# Patient Record
Sex: Female | Born: 1970 | Race: White | Hispanic: Yes | Marital: Single | Smoking: Former smoker
Health system: Southern US, Community
[De-identification: ages and names within clinical notes are randomized; demographics above are authoritative.]

## PROBLEM LIST (undated history)

## (undated) DIAGNOSIS — R51 Headache: Secondary | ICD-10-CM

## (undated) DIAGNOSIS — I959 Hypotension, unspecified: Secondary | ICD-10-CM

## (undated) DIAGNOSIS — F32A Depression, unspecified: Secondary | ICD-10-CM

## (undated) DIAGNOSIS — R519 Headache, unspecified: Secondary | ICD-10-CM

## (undated) DIAGNOSIS — M545 Low back pain, unspecified: Secondary | ICD-10-CM

## (undated) DIAGNOSIS — K5900 Constipation, unspecified: Secondary | ICD-10-CM

## (undated) DIAGNOSIS — D649 Anemia, unspecified: Secondary | ICD-10-CM

## (undated) DIAGNOSIS — F329 Major depressive disorder, single episode, unspecified: Secondary | ICD-10-CM

## (undated) DIAGNOSIS — E079 Disorder of thyroid, unspecified: Secondary | ICD-10-CM

## (undated) DIAGNOSIS — I499 Cardiac arrhythmia, unspecified: Secondary | ICD-10-CM

## (undated) DIAGNOSIS — I8390 Asymptomatic varicose veins of unspecified lower extremity: Secondary | ICD-10-CM

## (undated) DIAGNOSIS — K219 Gastro-esophageal reflux disease without esophagitis: Secondary | ICD-10-CM

## (undated) HISTORY — DX: Disorder of thyroid, unspecified: E07.9

## (undated) HISTORY — PX: TUBAL LIGATION: SHX77

---

## 2012-12-24 ENCOUNTER — Emergency Department (INDEPENDENT_AMBULATORY_CARE_PROVIDER_SITE_OTHER)
Admission: EM | Admit: 2012-12-24 | Discharge: 2012-12-24 | Disposition: A | Discharge status: Home / Self Care | Payer: BC Managed Care – PPO | Source: Home / Self Care | Attending: Family Medicine | Admitting: Family Medicine

## 2012-12-24 ENCOUNTER — Encounter (HOSPITAL_COMMUNITY): Payer: Self-pay | Admitting: *Deleted

## 2012-12-24 DIAGNOSIS — H6123 Impacted cerumen, bilateral: Secondary | ICD-10-CM

## 2012-12-24 DIAGNOSIS — H6120 Impacted cerumen, unspecified ear: Secondary | ICD-10-CM

## 2012-12-24 MED ORDER — DOCUSATE SODIUM 50 MG/5ML PO LIQD
ORAL | Status: AC
  Filled 2012-12-24: qty 10

## 2012-12-24 NOTE — ED Provider Notes (Signed)
Medical screening examination/treatment/procedure(s) were performed by resident physician or non-physician practitioner and as supervising physician I was immediately available for consultation/collaboration.   KINDL,JAMES DOUGLAS MD.   James D Kindl, MD 12/24/12 1911 

## 2012-12-24 NOTE — ED Provider Notes (Signed)
History     CSN: [id number]  Arrival date & time 12/24/12  1617   First MD Initiated Contact with Patient 12/24/12 1709      Chief Complaint  Patient presents with  . Otalgia    (Consider location/radiation/quality/duration/timing/severity/associated sxs/prior treatment) Patient is a 42 y.o. female presenting with ear pain. The history is provided by the patient. No language interpreter was used.  Otalgia Location:  Bilateral Severity:  Mild Onset quality:  Gradual Timing:  Constant Pt complains of wax build up in both ears  History reviewed. No pertinent past medical history.  History reviewed. No pertinent past surgical history.  History reviewed. No pertinent family history.  History  Substance Use Topics  . Smoking status: Never Smoker   . Smokeless tobacco: Not on file  . Alcohol Use: No    OB History   Grav Para Term Preterm Abortions TAB SAB Ect Mult Living                  Review of Systems  HENT: Positive for ear pain.   All other systems reviewed and are negative.    Allergies  Review of patient's allergies indicates no known allergies.  Home Medications  No current outpatient prescriptions on file.  BP 112/80  Pulse 72  Temp(Src) 98.6 F (37 C) (Oral)  Resp 16  SpO2 100%  LMP 12/21/2012  Physical Exam  Constitutional: She appears well-developed and well-nourished.  HENT:  Head: Normocephalic and atraumatic.  Mouth/Throat: Oropharynx is clear and moist.  bilat tm's occluded with wax  Eyes: Pupils are equal, round, and reactive to light.  Neurological: She is alert.    ED Course  Procedures (including critical care time)  Labs Reviewed - No data to display No results found.   1. Cerumen impaction, bilateral       MDM  RN irrigatted tm's until clear        Elson Areas, PA-C 12/24/12 1844

## 2012-12-24 NOTE — ED Notes (Signed)
Pt  Reports  Symptoms  Of  l    Earache   X  4  Days       -  denys  Any  sorethroat         denys  Any  Fever              Sitting  Upright  On  Exam table      Speaking in  Complete  sentances

## 2013-10-25 ENCOUNTER — Ambulatory Visit: Payer: BC Managed Care – PPO | Attending: Internal Medicine

## 2014-12-25 ENCOUNTER — Ambulatory Visit: Payer: 59 | Attending: Internal Medicine | Admitting: Internal Medicine

## 2014-12-25 ENCOUNTER — Encounter: Payer: Self-pay | Admitting: Internal Medicine

## 2014-12-25 VITALS — BP 102/65 | HR 87 | Temp 98.5°F | Resp 16 | Ht 63.0 in | Wt 119.0 lb

## 2014-12-25 DIAGNOSIS — R1013 Epigastric pain: Secondary | ICD-10-CM

## 2014-12-25 DIAGNOSIS — M5430 Sciatica, unspecified side: Secondary | ICD-10-CM | POA: Insufficient documentation

## 2014-12-25 DIAGNOSIS — R12 Heartburn: Secondary | ICD-10-CM | POA: Insufficient documentation

## 2014-12-25 DIAGNOSIS — R52 Pain, unspecified: Secondary | ICD-10-CM

## 2014-12-25 DIAGNOSIS — M5431 Sciatica, right side: Secondary | ICD-10-CM

## 2014-12-25 DIAGNOSIS — K30 Functional dyspepsia: Secondary | ICD-10-CM

## 2014-12-25 LAB — CBC
HEMATOCRIT: 34.3 % — AB (ref 36.0–46.0)
HEMOGLOBIN: 10.7 g/dL — AB (ref 12.0–15.0)
MCH: 25.1 pg — AB (ref 26.0–34.0)
MCHC: 31.2 g/dL (ref 30.0–36.0)
MCV: 80.3 fL (ref 78.0–100.0)
MPV: 10.8 fL (ref 8.6–12.4)
Platelets: 252 10*3/uL (ref 150–400)
RBC: 4.27 MIL/uL (ref 3.87–5.11)
RDW: 14.6 % (ref 11.5–15.5)
WBC: 3.7 10*3/uL — ABNORMAL LOW (ref 4.0–10.5)

## 2014-12-25 LAB — COMPLETE METABOLIC PANEL WITH GFR
ALK PHOS: 62 U/L (ref 39–117)
ALT: 25 U/L (ref 0–35)
AST: 25 U/L (ref 0–37)
Albumin: 4.2 g/dL (ref 3.5–5.2)
BUN: 9 mg/dL (ref 6–23)
CALCIUM: 9.5 mg/dL (ref 8.4–10.5)
CHLORIDE: 102 meq/L (ref 96–112)
CO2: 25 mEq/L (ref 19–32)
CREATININE: 0.9 mg/dL (ref 0.50–1.10)
GFR, Est Non African American: 79 mL/min
GLUCOSE: 84 mg/dL (ref 70–99)
POTASSIUM: 4.7 meq/L (ref 3.5–5.3)
SODIUM: 137 meq/L (ref 135–145)
TOTAL PROTEIN: 7.4 g/dL (ref 6.0–8.3)
Total Bilirubin: 0.4 mg/dL (ref 0.2–1.2)

## 2014-12-25 LAB — TSH: TSH: 2.049 u[IU]/mL (ref 0.350–4.500)

## 2014-12-25 MED ORDER — RANITIDINE HCL 150 MG PO TABS: 150.0000 mg | ORAL_TABLET | Freq: Two times a day (BID) | ORAL | Status: DC

## 2014-12-25 MED ORDER — IBUPROFEN 600 MG PO TABS: 600.0000 mg | ORAL_TABLET | Freq: Two times a day (BID) | ORAL | Status: DC | PRN

## 2014-12-25 MED ORDER — CYCLOBENZAPRINE HCL 5 MG PO TABS: 5.0000 mg | ORAL_TABLET | Freq: Three times a day (TID) | ORAL | Status: DC | PRN

## 2014-12-25 NOTE — Progress Notes (Signed)
Pt is here today to establish care. Pt is c/o chronic widespread pain. Interpreter Francisco.

## 2014-12-25 NOTE — Progress Notes (Signed)
Patient ID: Desiree Krueger, female   DOB: 1971/01/21, 44 y.o.   MRN: [id number]   [id number]  [id number]  DOB - [birth year]  CC:  Chief Complaint  Patient presents with  . Establish Care       HPI: Desiree Krueger is a 44 y.o. female here today to establish medical care. She has no past medical history. She presents today with widespread pain that has been present for 6 months.  The pain is located in her head, left ear, stomach, and left lower back. Everyday the pain is located somewhere different.  It is described as a deep pain in her head and shoulders. It is moderate in severity. She has tried taking tylenol for pain, with moderate relief. Lower back radiates down her right leg and feels like a nerve pain. She reports that she has been overly stressed and slightly depressed about some personal issues..  No Known Allergies History reviewed. No pertinent past medical history. No current outpatient prescriptions on file prior to visit.   No current facility-administered medications on file prior to visit.   Family History  Problem Relation Age of Onset  . Cancer Maternal Uncle    History   Social History  . Marital Status: Single    Spouse Name: N/A  . Number of Children: N/A  . Years of Education: N/A   Occupational History  . Not on file.   Social History Main Topics  . Smoking status: Never Smoker   . Smokeless tobacco: Not on file  . Alcohol Use: No  . Drug Use: Not on file  . Sexual Activity: Not on file   Other Topics Concern  . Not on file   Social History Narrative    Review of Systems  Constitutional: Positive for malaise/fatigue. Negative for fever, chills and weight loss.  HENT: Positive for ear pain. Negative for ear discharge.   Gastrointestinal: Positive for heartburn and abdominal pain.  Musculoskeletal: Positive for myalgias and back pain.  Neurological: Positive for headaches.  Psychiatric/Behavioral: Positive for depression.    All other systems reviewed and are negative.  .    Objective:   Filed Vitals:   12/25/14 0909  BP: 102/65  Pulse: 87  Temp: 98.5 F (36.9 C)  Resp: 16    Physical Exam: Constitutional: Patient appears well-developed and well-nourished. No distress. HENT: Normocephalic, atraumatic, External right and left ear normal. Oropharynx is clear and moist.  Eyes: Conjunctivae and EOM are normal. PERRLA, no scleral icterus. Neck: Normal ROM. Neck supple. No JVD. No tracheal deviation. No thyromegaly. CVS: RRR, S1/S2 +, no murmurs, no gallops, no carotid bruit.  Pulmonary: Effort and breath sounds normal, no stridor, rhonchi, wheezes, rales.  Abdominal: Soft. BS +, no distension, tenderness, rebound or guarding.  Musculoskeletal: Normal range of motion. No edema and no tenderness. + straight leg raise--right  Lymphadenopathy: No lymphadenopathy noted, cervical Neuro: Alert. Normal reflexes, muscle tone coordination. No cranial nerve deficit. Skin: Skin is warm and dry. No rash noted. Not diaphoretic. No erythema. No pallor. Psychiatric: Normal mood and affect. Behavior, judgment, thought content normal.  No results found for: WBC, HGB, HCT, MCV, PLT No results found for: CREATININE, BUN, NA, K, CL, CO2  No results found for: HGBA1C Lipid Panel  No results found for: CHOL, TRIG, HDL, CHOLHDL, VLDL, LDLCALC     Assessment and plan:   Desiree Krueger was seen today for establish care.  Diagnoses and all orders for this visit:  Sciatica, right Orders: -  Begin ibuprofen (ADVIL,MOTRIN) 600 MG tablet; Take 1 tablet (600 mg total) by mouth 2 (two) times daily as needed. For pain -     Begin cyclobenzaprine (FLEXERIL) 5 MG tablet; Take 1 tablet (5 mg total) by mouth 3 (three) times daily as needed for muscle spasms. Back pain  Acid indigestion Orders: -     Begin ranitidine (ZANTAC) 150 MG tablet; Take 1 tablet (150 mg total) by mouth 2 (two) times daily. For stomach Discussed diet and  weight with patient relating to acid reflux.  Went over things that may exacerbate acid reflux such as tomatoes, spicy foods, coffee, carbonated beverages, chocolates, etc.  Advised patient to avoid laying down at least two hours after meals and sleep with HOB elevated.   Generalized pain Orders: -     TSH -     CBC -     COMPLETE METABOLIC PANEL WITH GFR -     Vitamin D, 25-hydroxy Will check blood levels to make sure it is not thyroid or vitamin D. If patients pain does not improve with ibuprofen and flexeril she will need additional depression screening.   Due to language barrier, an interpreter was present during the history-taking and subsequent discussion (and for part of the physical exam) with this patient.   Return if symptoms worsen or fail to improve.   Chari Manning, [facility] [phone] 12/25/2014, 9:18 AM

## 2014-12-25 NOTE — Patient Instructions (Signed)
Ciática  °(Sciatica) ° La ciática es el dolor, debilidad, entumecimiento u hormigueo a lo largo del nervio ciático. El nervio comienza en la zona inferior de la espalda y desciende por la parte posterior de cada pierna. El nervio controla los músculos de la parte inferior de la pierna y de la zona posterior de la rodilla, y transmite la sensibilidad a la parte posterior del muslo, la pierna y la planta del pie. La ciática es un síntoma de otras afecciones médicas. Por ejemplo, un daño a los nervios o algunas enfermedades como un disco herniado o un espolón óseo en la columna vertebral, podrían dañarle o presionar en el nervio ciático. Esto causa dolor, debilidad y otras sensaciones normalmente asociadas con la ciática. Generalmente la ciática afecta sólo un lado del cuerpo. °CAUSAS  °· Disco herniado o desplazado. °· Enfermedad degenerativa del disco. °· Un síndrome doloroso que compromete un músculo angosto de los glúteos (síndrome piriforme). °· Lesión o fractura pélvica. °· Embarazo. °· Tumor (casos raros). °SÍNTOMAS  °Los síntomas pueden variar de leves a muy graves. Por lo general, los síntomas descienden desde la zona lumbar a las nalgas y la parte posterior de la pierna. Ellos son:  °· Hormigueo leve o dolor sordo en la parte inferior de la espalda, la pierna o la cadera. °· Adormecimiento en la parte posterior de la pantorrilla o la planta del pie. °· Sensación de quemazón en la zona lumbar, la pierna o la cadera. °· Dolor agudo en la zona inferior de la espalda, la pierna o la cadera. °· Debilidad en las piernas. °· Dolor de espalda intenso que inhibe los movimientos. °Los síntomas pueden empeorar al toser, estornudar, reír o estar sentado o parado durante mucho tiempo. Además, el sobrepeso puede empeorar los síntomas.  °DIAGNÓSTICO  °Su médico le hará un examen físico para buscar los síntomas comunes de la ciática. Le pedirá que haga algunos movimientos o actividades que activarían el dolor del nervio  ciático. Para encontrar las causas de la ciática podrá indicarle otros estudios. Estos pueden ser:  °· Análisis de sangre. °· Radiografías. °· Pruebas de diagnóstico por imágenes, como resonancia magnética o tomografía computada. °TRATAMIENTO  °El tratamiento se dirige a las causas de la ciática. A veces, el tratamiento no es necesario, y el dolor y el malestar desaparecen por sí mismos. Si necesita tratamiento, su médico puede sugerir:  °· Medicamentos de venta libre para aliviar el dolor. °· Medicamentos recetados, como antiinflamatorios, relajantes musculares o narcóticos. °· Aplicación de calor o hielo en la zona del dolor. °· Inyecciones de corticoides para disminuir el dolor, la irritación y la inflamación alrededor del nervio. °· Reducción de la actividad en los períodos de dolor. °· Ejercicios y estiramiento del abdomen para fortalecer y mejorar la flexibilidad de la columna vertebral. Su médico puede sugerirle perder peso si el peso extra empeora el dolor de espalda. °· Fisioterapia. °· La cirugía para eliminar lo que presiona o pincha el nervio, como un espolón óseo o parte de una hernia de disco. °INSTRUCCIONES PARA EL CUIDADO EN EL HOGAR  °· Sólo tome medicamentos de venta libre o recetados para calmar el dolor o el malestar, según las indicaciones de su médico. °· Aplique hielo sobre el área dolorida durante 20 minutos 3-4 veces por día durante los primeras 48-72 horas. Luego intente aplicar calor de la misma manera. °· Haga ejercicios, elongue o realice sus actividades habituales, si no le causan más dolor. °· Cumpla con todas las sesiones de fisioterapia, según le   indique su mdico.  Cumpla con todas las visitas de control, segn le indique su mdico.  No use tacones altos o zapatos que no tengan buen apoyo.  Verifique que el colchn no sea muy blando. Un colchn firme Best boy y las [place]. SOLICITE ATENCIN MDICA DE INMEDIATO SI:   Pierde el control de la vejiga o del intestino  (incontinencia).  Aumenta la debilidad en la zona inferior de la espalda, la pelvis, las nalgas o las piernas.  Siente irritacin o inflamacin en la espalda.  Tiene sensacin de ardor al [employer].  El dolor empeora cuando se acuesta o lo despierta por la noche.  El dolor es peor del que experiment en el pasado.  Dura ms de 4 semanas.  Pierde peso sin motivo de [place] sbita. ASEGRESE DE QUE:   Comprende estas instrucciones.  Controlar su enfermedad.  Solicitar ayuda de inmediato si no mejora o si empeora. Document Released: 08/01/2005 Document Revised: 01/31/2012 [facility] Patient Information 2015 [place], [place]. This information is not intended to replace advice given to you by your health care provider. Make sure you discuss any questions you have with your health care provider.

## 2014-12-26 LAB — VITAMIN D 25 HYDROXY (VIT D DEFICIENCY, FRACTURES): Vit D, 25-Hydroxy: 34 ng/mL (ref 30–100)

## 2015-01-19 ENCOUNTER — Telehealth: Payer: Self-pay | Admitting: Internal Medicine

## 2015-01-19 ENCOUNTER — Telehealth: Payer: Self-pay | Admitting: *Deleted

## 2015-01-19 NOTE — Telephone Encounter (Signed)
PI [phone] left message

## 2015-01-19 NOTE — Telephone Encounter (Signed)
-----   Message from Lance Bosch, NP sent at 01/13/2015 12:55 PM EDT ----- Patient is anemic. Find out if she has heavy menstrual cycles and let me know. This may be some of the reason why she feels fatigued. All other labs are normal. May help for patient to take a OTC iron supplement once every other day for the next month.

## 2015-01-19 NOTE — Telephone Encounter (Signed)
Patient is returning phone call from nurse, would like a call back after 1pm this afternoon. Please f/u with pt.

## 2015-01-22 ENCOUNTER — Telehealth: Payer: Self-pay | Admitting: Internal Medicine

## 2015-01-22 NOTE — Telephone Encounter (Signed)
Patient came into office requesting results, patient would like to be reached after 1pm everyday. Please f/u with patient

## 2016-02-22 ENCOUNTER — Ambulatory Visit (INDEPENDENT_AMBULATORY_CARE_PROVIDER_SITE_OTHER): Payer: BLUE CROSS/BLUE SHIELD | Admitting: Gynecology

## 2016-02-22 ENCOUNTER — Encounter: Payer: Self-pay | Admitting: Gynecology

## 2016-02-22 VITALS — BP 108/68 | Ht 62.0 in | Wt 118.0 lb

## 2016-02-22 DIAGNOSIS — N938 Other specified abnormal uterine and vaginal bleeding: Secondary | ICD-10-CM | POA: Insufficient documentation

## 2016-02-22 LAB — CBC WITH DIFFERENTIAL/PLATELET
BASOS PCT: 0 %
Basophils Absolute: 0 cells/uL (ref 0–200)
EOS PCT: 2 %
Eosinophils Absolute: 98 cells/uL (ref 15–500)
HEMATOCRIT: 33.3 % — AB (ref 35.0–45.0)
HEMOGLOBIN: 10.7 g/dL — AB (ref 11.7–15.5)
LYMPHS ABS: 2107 {cells}/uL (ref 850–3900)
Lymphocytes Relative: 43 %
MCH: 28.1 pg (ref 27.0–33.0)
MCHC: 32.1 g/dL (ref 32.0–36.0)
MCV: 87.4 fL (ref 80.0–100.0)
MONO ABS: 343 {cells}/uL (ref 200–950)
MPV: 10.6 fL (ref 7.5–12.5)
Monocytes Relative: 7 %
NEUTROS PCT: 48 %
Neutro Abs: 2352 cells/uL (ref 1500–7800)
Platelets: 244 10*3/uL (ref 140–400)
RBC: 3.81 MIL/uL (ref 3.80–5.10)
RDW: 13.9 % (ref 11.0–15.0)
WBC: 4.9 10*3/uL (ref 3.8–10.8)

## 2016-02-22 NOTE — Progress Notes (Signed)
   HPI: Patient is a 45 year old gravida 4 para 4 (prior laparoscopic tubal ligation) is a new patient to the practice. Patient stated that she had complete gynecological exam to include Pap smear and mammogram in November 2016 in her native country of [place]. She stated that about 10 years ago she had cryotherapy of her cervix and after that her patches of been normal. The reason for visit is that for the past 8 months she has complaining of irregular bleeding whereby some months she'll go bleeding for 2 months with passage of large clots and cramping. She denies any bleeding disorders.  ROS: A ROS was performed and pertinent positives and negatives are included in the history.  GENERAL: No fevers or chills. HEENT: No change in vision, no earache, sore throat or sinus congestion. NECK: No pain or stiffness. CARDIOVASCULAR: No chest pain or pressure. No palpitations. PULMONARY: No shortness of breath, cough or wheeze. GASTROINTESTINAL: No abdominal pain, nausea, vomiting or diarrhea, melena or bright red blood per rectum. GENITOURINARY: No urinary frequency, urgency, hesitancy or dysuria. MUSCULOSKELETAL: No joint or muscle pain, no back pain, no recent trauma. DERMATOLOGIC: No rash, no itching, no lesions. ENDOCRINE: No polyuria, polydipsia, no heat or cold intolerance. No recent change in weight. HEMATOLOGICAL: See above NEUROLOGIC: No headache, seizures, numbness, tingling or weakness. PSYCHIATRIC: No depression, no loss of interest in normal activity or change in sleep pattern.   PE: Blood pressure 108/68   weight 118 pounds   BMI 21.58 kg/m Gen. appearance well-developed C female with above-mentioned complaint Abdomen: Soft nontender no rebound or guarding Pelvic: Bartholin urethra Skene was within normal limits Vagina: No lesions or discharge no blood noted in the vaginal vault Cervix: No lesions or discharge Uterus: Anteverted normal size shape and consistency and nontender Adnexa:  No palpable mass or tenderness Rectal exam not done  Patient was counseled for an endometrial biopsy due to her irregular bleeding pattern as described above. The cervix was cleansed with Betadine solution. The uterus sounded to 8 cm. A sterile Pipelle was introduced into the uterine cavity and tissue was obtained and submitted for histological evaluation. Patient was given 2 tablets of Advil for some cramping after procedure otherwise she tolerated well.   Assessment Plan: Dysfunctional uterine bleeding for 8 months. Endometrial biopsy done today result pending at time of this dictation. Patient will be scheduled for sonohysterogram next week to completely assess the uterine cavity to rule out endometrial polyp or submucous myoma. If her biopsy is negative the son hysterogram is negative I've given her literature information on several alternative such as endometrial ablation, Mirena IUD and if all fails hysterectomy. Patient stated several years in the past before her tubal ligation she could not tolerate oral contraceptive pills because she got nauseated.    Greater than 50% of time was spent in counseling and coordinating care of this patient.   Time of consultation: 30   Minutes.

## 2016-02-22 NOTE — Patient Instructions (Addendum)
Levonorgestrel intrauterine device (IUD) Qu es este medicamento? El LEVONORGESTREL (DIU) es un dispositivo anticonceptivo (control de natalidad). El dispositivo se coloca dentro del tero por un profesional de la salud. Se utiliza para Therapist, occupational y tambin se puede Risk manager para tratar el sangrado abundante que ocurre durante su perodo. Dependiendo del dispositivo, se puede utilizar por 3 a 5 aos. Este medicamento puede ser utilizado para otros usos; si tiene alguna pregunta consulte con su proveedor de atencin mdica o con su farmacutico. Qu le debo informar a mi profesional de la salud antes de tomar este medicamento? Necesita saber si usted presenta alguno de los siguientes problemas o situaciones: -exmen de Papanicolaou anormal -cncer de mama, cuello del tero o tero -diabetes -endometritis -si tiene una infeccin plvica o genital actual o en el pasado -tiene ms de una pareja sexual o si su pareja tiene ms de una pareja -enfermedad cardiaca -antecedente de embarazo tubrico o ectpico -problemas del sistema inmunolgico -DIU colocado -enfermedad heptica o tumor del hgado -problemas con la coagulacin o si toma diluyentes sanguneos -[place] medicamentos intravenoso -forma inusual del tero -sangrado vaginal que no tiene explicacin -una reaccin alrgica o inusual al levonorgestrel, a otras hormonas, a la silicona o polietilenos, a otros medicamentos, alimentos, colorantes o conservantes -si est embarazada o buscando quedar embarazada -si est amamantando a un beb Cmo debo utilizar este medicamento? Un profesional de Estate agent este dispositivo en el tero. Hable con su pediatra para informarse acerca del uso de este medicamento en nios. Puede requerir atencin especial. Sobredosis: Pngase en contacto inmediatamente con un centro toxicolgico o una sala de urgencia si usted cree que haya tomado demasiado medicamento. ATENCIN: [employer] es solo  para usted. No comparta este medicamento con nadie. Qu sucede si me olvido de una dosis? No se aplica en este caso. Qu puede interactuar con este medicamento? No tome esta medicina con ninguno de los siguientes medicamentos: -amprenavir -bosentano -fosamprenavir Esta medicina tambin puede interactuar con los siguientes medicamentos: -aprepitant -barbitricos para producir el sueo o para el tratamiento de convulsiones -bexaroteno -griseofulvina -medicamentos para tratar los convulsiones, tales como [place], [place], [place], [place], [place], topiramato -modafinilo -pioglitazona -rifabutina -rifampicina -rifapentina -algunos medicamentos para tratar el virus VIH, tales como atazanavir, indinavir, lopinavir, nelfinavir, tipranavir, ritonavir -hierba de [place] -warfarina Puede ser que esta lista no menciona todas las posibles interacciones. Informe a su profesional de [employer]	[place] de [employer] productos a base de hierbas, medicamentos de [place] o suplementos nutritivos que est tomando. Si usted fuma, consume bebidas alcohlicas o si utiliza drogas ilegales, indqueselo tambin a su profesional de [employer]	[place]. Algunas sustancias pueden interactuar con su medicamento. A qu debo estar atento al usar [employer]? Visite a su mdico o a su profesional de la salud para chequear su evolucin peridicamente. Visite a su mdico si usted o su pareja tiene relaciones sexuales con [employer], se vuelve VIH positivo o contrae una enfermedad de transmisin sexual. Este medicamento no la protege de la infeccin por VIH (SIDA) ni de ninguna otra enfermedad de transmisin sexual. Puede controlar la ubicacin del DIU usted misma palpando con sus dedos limpios los hilos en la parte anterior de la vagina. No tire de los hilos. Es un buen hbito controlar la ubicacin del dispositivo despus de cada perodo menstrual. Si no slo siente los hilos sino que adems siente otra parte  ms del DIU o si no puede sentir los hilos, consulte a su mdico inmediatamente. El DIU puede salirse  por s solo. Puede quedar embarazada si el dispositivo se sale de Chief of Staff. Utilice un mtodo anticonceptivo adicional, como preservativos, y consulte a su proveedor de atencin mdica s observa que el DIU se sali de Chief of Staff. La utilizacin de tampones no cambia la posicin del DIU y no hay inconvenientes en usarlos durante su perodo. Qu efectos secundarios puedo tener al [employer] este medicamento? Efectos secundarios que debe informar a su mdico o a Barrister's clerk de la salud tan pronto como sea posible: -Chief of Staff como erupcin cutnea, picazn o urticarias, hinchazn de la cara, labios o lengua -fiebre, sntomas gripales -llagas genitales -alta presin sangunea -ausencia de un perodo menstrual durante 6 semanas mientras lo utiliza -Social research officer, government, Occupational hygienist en las piernas -dolor o sensibilidad del plvico -dolor de cabeza repentino o severo -signos de Media planner -calambres estomacales -falta de aliento repentina -problemas de coordinacin, del habla, al caminar -sangrado, flujo vaginal inusual -color amarillento de los ojos o la piel Efectos secundarios que, por lo general, no requieren atencin mdica (debe informarlos a su mdico o a su profesional de la salud si persisten o si son molestos): -acn -dolor de pecho -cambios en el deseo sexual o capacidad -cambios de peso -calambres, Tree surgeon o sensacin de [employer] se introduce el dispositivo -dolor de cabeza -sangrado menstruales irregulares en los primeros 3 a 6 meses de usar -nuseas Puede ser que esta lista no menciona todos los posibles efectos secundarios. Comunquese a su mdico por asesoramiento mdico [employer] Usted puede informar los efectos secundarios a la FDA por telfono al 1-800-FDA-1088. Dnde debo guardar mi medicina? No se aplica en este caso. ATENCIN: Este folleto es  un resumen. Puede ser que no cubra toda la posible informacin. Si usted tiene preguntas acerca de esta medicina, consulte con su mdico, su farmacutico o su profesional de Technical sales engineer.    2016, Elsevier/Gold Standard. (2014-09-23 00:00:00) Biopsia de endometrio - Cuidados posteriores (Endometrial Biopsy, Care After) Siga estas instrucciones durante las prximas semanas. Estas indicaciones le proporcionan informacin general acerca de cmo deber cuidarse despus del procedimiento. El mdico tambin podr darle instrucciones ms especficas. El tratamiento se ha planificado de acuerdo a las prcticas mdicas actuales, pero a veces se producen problemas. Comunquese con el mdico si tiene algn problema o tiene dudas despus del procedimiento. QU ESPERAR DESPUS DEL PROCEDIMIENTO Despus del procedimiento, es tpico tener las siguientes sensaciones:  Sentir clicos leves y tendr una pequea cantidad de sangrado vaginal durante algunos das despus del procedimiento. Esto es normal. INSTRUCCIONES PARA EL CUIDADO EN EL HOGAR  Tome slo medicamentos de venta libre o recetados, segn las indicaciones del mdico.  No utilice tampones, duchas vaginales ni tenga relaciones sexuales hasta que el profesional la autorice.  Siga las indicaciones del mdico relacionadas con la restriccin a ciertas actividades, como ejercicios fsicos intensos o levantar objetos pesados. SOLICITE ATENCIN MDICA SI:  Tiene un sangrado abundante o sangra durante ms de 2 das despus del procedimiento.  Advierte un olor ftido que proviene de la vagina.  Siente escalofros o tiene fiebre.  Siente un dolor en el bajo vientre (abdominal) muy intenso. SOLICITE ATENCIN MDICA DE INMEDIATO SI:  Siente clicos intensos en el estmago o en la espalda.  Elimina cogulos grandes.  La hemorragia aumenta.  Se siente mareada, dbil, o se desmaya.   Esta informacin no tiene Marine scientist el consejo del mdico.  Asegrese de hacerle al mdico cualquier pregunta que tenga.   Document Released: 05/22/2013 Elsevier Interactive  Patient Education 2016 [place] endometrio (Endometrial Ablation) En la ablacin del endometrio se extirpa el recubrimiento interno del tero (endometrio). Este es generalmente un tratamiento que se Scientist, water quality, en forma ambulatoria. La ablacin permite evitar una ciruga mayor, como la ciruga para extirpar el cuello del tero y Nurse, learning disability (histerectoma). Luego de la ablacin del endometrio, tendr poco o nada de flujo menstrual y no podr tener hijos. Sin embargo, si se encuentra en la etapa de la premenopausia, deber usar un mtodo confiable de control de la natalidad luego del procedimiento, ya que hay una pequea probabilidad de que ocurra un Media planner. Hay diferentes razones para llevar a cabo este procedimiento, ellas son:  Perodos abundantes.  Sangrado que causa anemia.  Sangrado irregular.  Fibromas que sangran en la superficie interior del tero, si no miden ms de 3 cm. Tal vez no sea posible realizarle este procedimiento si:  Desea tener hijos en el futuro.  Tiene clicos intensos durante el perodo menstrual.  Tiene clulas precancerosas o cancerosas en el tero.  Tuvo un embarazo reciente.  Est cursando la menopausia.  Se someti a [place] mayor de tero, la cual le produjo el afinamiento de la pared [place]. Las cirugas pueden incluir lo siguiente:  La extirpacin de uno o ms fibromas uterinos (miomectoma).  Ardelia Mems cesrea con una incisin clsica (vertical) en el tero. Pregntele al mdico qu tipo de Pensions consultant. En ocasiones, la Arts development officer piel es diferente de la que se forma en el tero. Aunque se haya sometido a una ciruga uterina, algunos tipos de ablacin an pueden ser seguros para su caso. Hable con el mdico. INFORME A SU MDICO:  Cualquier alergia que tenga.  Todos los [employer] [place],  incluyendo vitaminas, hierbas, gotas oftlmicas, cremas y medicamentos de venta libre.  Problemas previos que usted o los [employer] de su familia hayan tenido con el uso de anestsicos.  Enfermedades de [employer].  Cirugas previas.  Padecimientos mdicos. RIESGOS Y COMPLICACIONES  Generalmente, este es un procedimiento seguro. Sin embargo, Games developer procedimiento, pueden surgir complicaciones. Las complicaciones posibles son:  Perforacin del tero.  Hemorragias.  Infeccin en el tero, vejiga, o vagina.  Lesin en los rganos circundantes.  Ardelia Mems burbuja de aire en un pulmn (embolia de aire).  Embarazo luego del procedimiento.  Si el procedimiento no resuelve el problema, ser necesaria una histerectoma.  Disminucin de la capacidad para Community education officer en el recubrimiento interno del tero. ANTES DEL PROCEDIMIENTO  Deber controlarse la membrana que recubre el tero para asegurarse de que no hay clulas cancerosas o precancerosas.  Le harn una ecografa para observar el tamao del tero y verificar si hay anormalidades.  Le darn medicamentos para Engineer, structural de la membrana que recubre el tero. PROCEDIMIENTO  Durante el procedimiento, el medico usar un instrumento llamado resectoscopio para ver el interior del tero. Hay diferentes formas de extirpar la membrana que cubre el tero.   Radiofrecuencia: en este mtodo se utiliza un aparato de radiofrecuencia, corriente elctrica alterna, para extirpar la membrana interna del tero.  Crioterapia: en este mtodo se [place] fro extremo para congelar la membrana del tero.  Lquido caliente: en este mtodo se utiliza solucin salina con el mismo objetivo.  Microondas: Se utilizan microondas de alta energa para calentar la membrana y extirparla.  Baln termal: consiste en insertar un catter con un baln en la punta dentro del tero. La punta del baln contiene un lquido caliente  que elimina la  membrana que tapiza el tero. DESPUS DEL PROCEDIMIENTO  Despus del procedimiento, no tenga relaciones sexuales ni inserte nada en la vagina hasta que su mdico la autorice. Despus del procedimiento podr experimentar:  Clicos.  Flujo vaginal.  Ganas de orinar con frecuencia.   Esta informacin no tiene Marine scientist el consejo del mdico. Asegrese de hacerle al mdico cualquier pregunta que tenga.   Document Released: 04/03/2013 Document Revised: 04/22/2015 Elsevier Interactive Patient Education [employer].

## 2016-03-02 ENCOUNTER — Other Ambulatory Visit: Payer: Self-pay | Admitting: Gynecology

## 2016-03-02 DIAGNOSIS — N939 Abnormal uterine and vaginal bleeding, unspecified: Secondary | ICD-10-CM

## 2016-03-08 ENCOUNTER — Telehealth: Payer: Self-pay | Admitting: Gynecology

## 2016-03-08 NOTE — Telephone Encounter (Signed)
03/08/16-I attempted to call the patient on cell per DPR but phone is not set up to accept incoming calls. Her BC ins will cover the sonohysterogram and bx if needed with her $20 copay. Per Justine@BC -Ref#[id number]

## 2016-03-16 ENCOUNTER — Ambulatory Visit (INDEPENDENT_AMBULATORY_CARE_PROVIDER_SITE_OTHER): Payer: BLUE CROSS/BLUE SHIELD

## 2016-03-16 ENCOUNTER — Other Ambulatory Visit: Payer: Self-pay | Admitting: Gynecology

## 2016-03-16 ENCOUNTER — Ambulatory Visit (INDEPENDENT_AMBULATORY_CARE_PROVIDER_SITE_OTHER): Payer: BLUE CROSS/BLUE SHIELD | Admitting: Gynecology

## 2016-03-16 ENCOUNTER — Encounter: Payer: Self-pay | Admitting: Gynecology

## 2016-03-16 DIAGNOSIS — N938 Other specified abnormal uterine and vaginal bleeding: Secondary | ICD-10-CM

## 2016-03-16 DIAGNOSIS — D250 Submucous leiomyoma of uterus: Secondary | ICD-10-CM | POA: Diagnosis not present

## 2016-03-16 DIAGNOSIS — D500 Iron deficiency anemia secondary to blood loss (chronic): Secondary | ICD-10-CM | POA: Diagnosis not present

## 2016-03-16 DIAGNOSIS — N939 Abnormal uterine and vaginal bleeding, unspecified: Secondary | ICD-10-CM | POA: Diagnosis not present

## 2016-03-16 DIAGNOSIS — N920 Excessive and frequent menstruation with regular cycle: Secondary | ICD-10-CM

## 2016-03-16 NOTE — Patient Instructions (Signed)
Informacin sobre Training and development officer (Hysterectomy Information)  La histerectoma es una ciruga que se realiza para extirpar el tero. Esta ciruga se puede realizar para tratar varios problemas mdicos. Despus de la ciruga, no volver a Personal assistant. Adems, debido a esta ciruga no podr quedar embarazada (estril). Asimismo, durante esta ciruga se podrn extirpar las trompas de falopio y los ovarios (salpingooforectoma bilateral).  RAZONES PARA REALIZAR UNA HISTERECTOMA  Sangrado persistente, anormal.  Dolor o infeccin persistente (crnico) en la pelvis.  El endometrio comienza a desarrollarse fuera del tero (endometriosis).  El endometrio comienza a desarrollarse en el msculo del tero (adenomiosis).  El tero desciende hacia la vagina (prolapso de los rganos de la pelvis).  Neoplasias benignas en el tero (fibromas uterinos) que provocan sntomas.  Clulas precancerosas.  Cncer cervical o cncer uterino. TIPOS DE HISTERECTOMA  Histerectoma supracervical: en este tipo de intervencin, se extirpa la parte superior del tero, pero no el cuello del tero.  Histerectoma total: se extirpan el tero y el cuello del tero.  Histerectoma radical: se extirpan el tero, el cuello del tero y los tejidos fibrosos que sostienen al tero en su lugar en la pelvis (parametrio). FORMAS EN QUE SE PUEDE REALIZAR UNA HISTERECTOMA  Histerectoma abdominal: se realiza un corte quirrgico grande (incisin) en el abdomen. Se extirpa el tero a travs de esta incisin.  Histerectoma vaginal: se realiza una incisin en la vagina. Se extirpa el tero a travs de esta incisin. No hay incisiones abdominales.  Histerectoma laparoscpica convencional: se realizan tres o cuatro incisiones pequeas en el abdomen. Se inserta un tubo delgado y luminoso con una cmara (laparoscopio) en una de las incisiones. A travs del resto de las incisiones se insertan otros instrumentos  quirrgicos. El tero se corta en trozos pequeos. Las piezas pequeas se eliminan a travs de las incisiones, o se retiran a travs de la vagina.  Histerectoma vaginal asistida por laparoscopia (HVAL): se realizan tres o cuatro incisiones pequeas en el abdomen. Parte de la ciruga se realiza por va laparoscpica y parte por va vaginal. El tero se extirpa a travs de la vagina.  Histerectoma laparoscpica asistida por robot: se insertan un laparoscopio y otros instrumentos quirrgicos en 3 o 4 incisiones pequeas en el abdomen. Se utiliza un dispositivo controlado por computadora para que el cirujano vea una imagen en 3D que lo ayuda a Chief Technology Officer los instrumentos quirrgicos. Esto permite movimientos ms precisos de los instrumentos quirrgicos. El tero se corta en trozos pequeos y se retira a travs de las incisiones o se elimina a travs de la vagina. RIESGOS Y [place] complicaciones asociadas con este procedimiento incluyen las siguientes:  Hemorragia y riesgos de Designer, industrial/product una transfusin sangunea. Infrmele al mdico si no quiere recibir ningn tipo de hemoderivados.  Cogulos de [employer] piernas o los pulmones.  Infeccin.  Lesin en los rganos circundantes.  Problemas o efectos secundarios relacionados con la anestesia.  Transformacin de cualquiera de las otras tcnicas en una histerectoma abdominal. QU ESPERAR DESPUES DE UNA HISTERECTOMA  Le darn medicamentos para el dolor.  Necesitar que alguien Nature conservation officer con usted durante los primeros 3 a 5 das despus de que regrese a Medical illustrator.  Tendr que ver al cirujano para un seguimiento 2 a 4 semanas despus de la ciruga para evaluar su progreso.  Posiblemente tenga sntomas tempranos de menopausia, como sofocos, sudoracin nocturna e insomnio.  Si le han realizado una histerectoma debido a un problema que no era cncer ni Ardelia Mems  afeccin que poda causar cncer, ya no necesitar una prueba de  Papanicolaou. Sin embargo, si ya no necesita hacerse un Papanicolau, es una buena idea hacerse un examen regularmente para asegurarse de que no hay otros problemas.   Esta informacin no tiene Marine scientist el consejo del mdico. Asegrese de hacerle al mdico cualquier pregunta que tenga.   Document Released: 08/01/2005 Document Revised: 05/22/2013 Elsevier Interactive Patient Education 2016 [employer]. Leuprolide depot injection Qu es [place] medicamento? El LEUPROLIDE es una protena artificial similar a la hormona natural en el cuerpo. Disminuye los niveles de [employer] hombres y de estrgeno en las mujeres. Para los hombres, este medicamento se [place] para tratar el cncer de prstata avanzada. Para las mujeres, algunas formas de este medicamento se utilizan parar tratar la endometriosis, fibromas uterinos u otros problemas relacionados a la hormona femenina. Este medicamento puede ser utilizado para otros usos; si tiene alguna pregunta consulte con su proveedor de atencin mdica o con su farmacutico. Qu le debo informar a mi profesional de la salud antes de tomar este medicamento? Necesita saber si usted presenta alguno de los [employer] o situaciones: -diabetes -enfermedad cardiaca o ataque cardiaco previo -alta presin sangunea -colesterol alto -osteoporosis -dolor o dificultad para orinar -metstasis en la mdula espinal -derrame cerebral -fuma tabaco -sangrado vaginal inusual (mujeres) -una reaccin alrgica o inusual al leuprolide, al alcohol benclico, a otros medicamentos, comidas, colorantes o conservantes -si est embarazada o buscando quedar embarazada -si est amamantando a un beb Cmo debo utilizar este medicamento? Este medicamento se administra mediante inyeccin por va intramuscular o inyeccin debajo de su piel. Lo administra un profesional de Technical sales engineer en un hospital o en un entorno clnico. El producto especifico determinar como  el medicamento ser administrado. Asegrese de entender cual producto a recibido y con que frecuencia lo va a recibir. Hable con su pediatra para informarse acerca del uso de este medicamento en nios. Puede requerir atencin especial. Sobredosis: Pngase en contacto inmediatamente con un centro toxicolgico o una sala de urgencia si usted cree que haya tomado demasiado medicamento. ATENCIN: [employer] es solo para usted. No comparta este medicamento con nadie. Qu sucede si me olvido de una dosis? Es importante no olvidar ninguna dosis. Informe a su mdico o a su profesional de la salud si no puede asistir a Photographer. Las Boeing de depot: Las inyecciones de depot se administran una vez al mes, cada 12 semanas, cada 16 semanas o cada 24 semanas depende del producto recetado. El producto que le recetan se basa en la enfermedad que tiene y si es un hombre o [employer]. Asegrese de entender su producto y su dosis. Qu puede interactuar con este medicamento? No tome esta medicina con ninguno de los siguientes medicamentos: -vitex Esta medicina tambin puede interactuar con los siguientes medicamentos: -suplementos a base de hierbas o dietticos como cimicifuga racemosa o DHEA -hormonas femeninas, como estrgenos, progestinas o pldoras, parches, anillos o inyecciones anticonceptivas -hormonas masculinas como la testosterona Puede ser que esta lista no menciona todas las posibles interacciones. Informe a su profesional de KB Home	[place] de [employer] Corporation productos a base de hierbas, medicamentos de [place] o suplementos nutritivos que est tomando. Si usted fuma, consume bebidas alcohlicas o si utiliza drogas ilegales, indqueselo tambin a su profesional de KB Home	[place]. Algunas sustancias pueden interactuar con su medicamento. A qu debo estar atento al usar [employer]? Visite a su mdico o a Barrister's clerk de la salud para Scientist, water quality evolucin  peridicamente. Durante las primeras semanas del  tratamiento sus sntomas pueden Copy, pero mejorarn a medida que avance con el Zeb. Puede experimentar sofocos, ms dolor en los huesos, mayor dificultad para Garment/textile technologist o un empeoramiento de los sntomas de los nervios. Consulte a su mdico o a Barrister's clerk de la salud sobre estos efectos; algunos de ellos pueden mejorar con el uso continuo de [place]. Las [employer] Corporation pueden experimentar un ciclo menstrual o [employer] primeros meses del tratamiento con este medicamento. Si sigue experimentado Apple Computer, consulte a su mdico o su profesional de Technical sales engineer. Qu efectos secundarios puedo tener al [employer] Corporation este medicamento? Efectos secundarios que debe informar a su mdico o a Barrister's clerk de la salud tan pronto como sea posible: -Chief of Staff como erupcin cutnea, picazn o urticarias, hinchazn de la cara, labios o lengua -problemas respiratorios -dolor en el pecho -depresin o trastornos de Engineering geologist en las piernas o entrepierna -Management consultant de la inyeccin o implante -dolor de cabeza severo -hinchazn de pies y piernas -cambios en la vista -vmito Efectos secundarios que, por lo general, no requieren atencin mdica (debe informarlos a su mdico o a su profesional de la salud si persisten o si son molestos): -hinchazn o sensibilidad de las mamas -disminucin de la capacidad o del deseo sexual -diarrea -sofocos -prdida del apetito -dolores musculares y Buyer, retail o de los huesos -nuseas -enrojecimiento o Actor de la inyeccin o implante -problemas en la piel o acn Puede ser que esta lista no menciona todos los posibles efectos secundarios. Comunquese a su mdico por asesoramiento mdico Humana Inc. Usted puede informar los efectos secundarios a la FDA por telfono al 1-800-FDA-1088. Dnde debo guardar mi medicina? Este medicamento se administra en hospitales o clnicas y no necesitar guardarlo en  su domicilio. ATENCIN: Este folleto es un resumen. Puede ser que no cubra toda la posible informacin. Si usted tiene preguntas acerca de esta medicina, consulte con su mdico, su farmacutico o su profesional de Technical sales engineer.    2016, Elsevier/Gold Standard. (2014-09-23 00:00:00) Histeroscopa (Hysteroscopy) La histeroscopa es un procedimiento que se [place] para observar el interior de la matriz (tero) Puede indicarse por diferentes motivos, entre ellos:  Para evaluar una hemorragia anormal, un fibroma (tumor benigno, no canceroso), tumores, plipos o tejido cicatrizal (adherencias) y la posibilidad de cncer de tero.  Para detectar bultos (tumores) y otros crecimientos anormales uterinos.  Para buscar las causas por las que una mujer no queda embarazada (infertilidad) causas recurrentes de prdida de embarazo (abortos espontneos) o por la prdida de un dispositivo intrauterino (DIU).  Para realizar un procedimiento de esterilizacin cerrando las trompas de Falopio desde adentro del tero. En este procedimiento, se coloca un tubo delgado y luminoso, con una cmara en el extremo (histeroscopio)para observar el interior del tero. La histeroscopa se realiza inmediatamente despus del perodo menstrual para asegurarse de que no existe embarazo. INFORME A SU MDICO:   Cualquier alergia que tenga.  Todos los [employer] [place], incluyendo vitaminas, hierbas, gotas oftlmicas, cremas y medicamentos de venta libre.  Problemas previos que usted o los UnitedHealth de su familia hayan tenido con el uso de anestsicos.  Enfermedades de [employer].  Cirugas previas.  Padecimientos mdicos. RIESGOS Y COMPLICACIONES  Generalmente es un procedimiento seguro. Sin embargo, Games developer procedimiento, pueden surgir complicaciones. Las complicaciones posibles son:  Perforacin del tero.  Sangrado excesivo.  Infeccin.  Lesin en el cuello del tero.  Lesiones en  otros  rganos.  Reaccin alrgica a un medicamento.  Inoculacin de lquido en exceso dentro del tero. ANTES DEL PROCEDIMIENTO   Consulte a su mdico si debe cambiar o suspender los medicamentos que toma habitualmente.  No tome aspirina ni anticoagulantes durante la semana previa al procedimiento, o segn le hayan indicado. Pueden ocasionar hemorragias.  Si fuma, no lo haga durante las AT&T al procedimiento.  En algunos casos, el da anterior al procedimiento se coloca un medicamento en el cuello del tero. Este medicamento dilata el cuello y [place] la abertura. Esto facilita la insercin del instrumento en el tero durante el procedimiento.  No debe comer ni beber nada durante al menos 8 horas antes de la [place].  Pdale a alguna persona que la lleve a su casa luego del procedimiento. PROCEDIMIENTO   Le administrarn un medicamento para relajarse (sedante). Tambin podrn administrarle uno de los siguientes medicamentos:  Medicamentos que adormecen el rea del cuello uterino (anestesia local).  Un medicamento para que duerma durante el procedimiento (anestesia genera).  El histeroscopio se inserta a travs de la vagina, dentro del tero. La cmara del laparoscopio enva una imagen a una pantalla de televisin que se encuentra en el quirfano. [employer] modo, el mdico tendr Ardelia Mems buena visin del interior del tero.  Durante el Wellsite geologist un lquido o aire en el interior de [place], lo que le permitir al cirujano observar mejor.  En algunas ocasiones, el tejido del interior del tero se legra suavemente. Esas muestras de tejido se envan al laboratorio para ser examinadas. DESPUS DEL PROCEDIMIENTO   Si le han administrado anestesia general podr sentirse atontada durante algunas horas despus del procedimiento.  Si se utiliza Tour manager, podr volver a su casa tan pronto como est estable y se sienta en condiciones.  Podr sentir clicos  leves. Es normal que esto dure un par [employer].  Podr tener una hemorragia, la que puede variar entre una pequea mancha durante algunos das hasta una hemorragia similar a Mining engineer durante 3-7 das. Esto es normal.  Si el resultado de los estudios no estn listos durante la visita, tenga otra entrevista con su mdico para conocerlos.   Esta informacin no tiene Marine scientist el consejo del mdico. Asegrese de hacerle al mdico cualquier pregunta que tenga.   Document Released: 08/01/2005 Document Revised: 05/22/2013 Elsevier Interactive Patient Education 2016 [place] uterinos (Uterine Fibroids) Los fibromas uterinos son masas (tumores) de tejido que pueden desarrollarse en el vientre (tero). Tambin se los [employer]. Este tipo de tumor no es Radio broadcast assistant (benigno) y no se disemina a Airline pilot del cuerpo fuera de la zona plvica, la cual se encuentra entre los huesos de la cadera. En ocasiones, los fibromas pueden crecer en las trompas de Falopio, en el cuello del tero o en las estructuras de soporte (ligamentos) que rodean el tero. Una mujer puede tener uno o ms fibromas. Los fibromas pueden tener diferente tamao y [place], y crecer en distintas partes del tero. Algunos pueden crecer hasta volverse bastante grandes. La mayora no requiere tratamiento mdico. CAUSAS Un fibroma puede desarrollarse cuando una nica clula uterina contina creciendo (se multiplica). La mayora de las clulas del cuerpo humano tienen un mecanismo de control que impide que se multipliquen sin control. [place] los sntomas se pueden incluir los siguientes:   Hemorragias intensas durante la menstruacin.  Prdidas de sangre o [employer].  Dolor y opresin en  la pelvis.  Problemas de la vejiga, como necesidad de Garment/textile technologist con ms frecuencia (polaquiuria) o necesidad imperiosa de Garment/textile technologist.  Incapacidad para reproducir  (infertilidad).  Abortos espontneos. DIAGNSTICO Los fibromas uterinos se diagnostican con un examen fsico. El mdico puede palpar los tumores grumosos durante un examen plvico. Pueden realizarse ecografas y Ardelia Mems resonancia magntica para determinar el tamao y la ubicacin de los fibromas, as como la cantidad. TRATAMIENTO El tratamiento puede incluir lo siguiente:  Observacin cautelosa. Esto requiere que el mdico controle el fibroma para saber si crece o se achica. Siga las recomendaciones del mdico respecto de la frecuencia con la que debe realizarse los controles.  Medicamentos hormonales. Pueden tomarse por va oral o administrarse a travs de un dispositivo intrauterino (DIU).  Ciruga.  Extirpacin de los fibromas (miomectoma) o del tero (histerectoma).  Suprimir la irrigacin sangunea a los fibromas (embolizacin de la arteria uterina). Si los fibromas le traen problemas de fertilidad y tiene deseos de quedar [place], el mdico puede recomendar su extirpacin.  INSTRUCCIONES PARA EL CUIDADO EN EL HOGAR  Concurra a todas las visitas de control como se lo haya indicado el mdico. Esto es importante.  Tome los medicamentos solamente como se lo haya indicado el mdico.  Si le recetaron un tratamiento hormonal, tome los medicamentos hormonales exactamente como se lo indicaron.  No tome aspirina, ya que puede causar hemorragias.  Consulte al [employer] sobre tomar comprimidos de hierro y Garment/textile technologist la cantidad de verduras de hoja color verde oscuro en la dieta. Estas medidas pueden ayudar a Transport planner de hierro en la [place], que pueden verse afectados por las hemorragias menstruales intensas.  Preste mucha atencin a Hydrographic surveyor e informe al mdico si hay algn cambio, por ejemplo:  Aumento del flujo de sangre que le exige el uso de ms compresas o tampones que los que utiliza normalmente cada mes.  Un cambio en la cantidad de [employer] dura la  menstruacin cada mes.  Un cambio en los sntomas asociados con la [place], como clicos abdominales o dolor de espalda. SOLICITE ATENCIN MDICA SI:  Tiene dolor plvico, dolor de espalda o clicos abdominales que los medicamentos no Engineer, petroleum.  Observa un aumento del sangrado entre y [employer].  Empapa los tampones o las compresas en el trmino de media hora o [place].  Se siente mareada, muy cansada o dbil. SOLICITE ATENCIN MDICA DE INMEDIATO SI:  Se desmaya.  El dolor plvico aumenta repentinamente.   Esta informacin no tiene Marine scientist el consejo del mdico. Asegrese de hacerle al mdico cualquier pregunta que tenga.   Document Released: 08/01/2005 Document Revised: 08/22/2014 Elsevier Interactive Patient Education [employer].

## 2016-03-16 NOTE — Progress Notes (Signed)
   Patient is a 45 year old gravida 4 para 4 who was seen in the office on July 10 as a new patient is a result of 8 months history of irregular vaginal bleeding sometimes she will be bleeding for up to 2 months with passage of large blood clots. During that office visit she had a an endometrial biopsy which demonstrated the following:  Diagnosis Endometrium, biopsy - PROLIFERATIVE ENDOMETRIUM. - NO HYPERPLASIA OR MALIGNANCY.   Her CBC indicated her hemoglobin was 10.7 which was to same as May 2016. She is currently on iron supplementation.  Today's ultrasound/sono hysterogram demonstrated the following: Uterus measured 8.5 x 6.2 x 4.8 cm endometrial stripe 17.1 mm. The cervix was cleansed with Betadine solution and sterile catheter was introduced into the uterine cavity. 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 Right ovary was normal left ovary was normal and no fluid in the cul-de-sac.  Assessment/plan: 45 year old patient with menorrhagia as a result of large intracavitary submucous myoma will benefit from Lupron 11.25 mg in an effort to treat this submucous myoma so that we can proceed with an outpatient resectoscopic myomectomy instead of a transvaginal hysterectomy. She was instructed to continue her iron supplementation. Literature information on hysteroscopy, fibroids, and hysterectomy were provided in Spanish. The risks benefits and pros and cons of all those procedures were discussed and all questions are answered.  In addition to the sonohysterogram greater than 50% of time was spent calcium coordinating care of this patient with submucous myoma contributing to her menorrhagia and anemia. An additional 15 minutes was spent.

## 2016-04-01 ENCOUNTER — Telehealth: Payer: Self-pay

## 2016-04-01 NOTE — Telephone Encounter (Signed)
Great thank you so much :)

## 2016-04-01 NOTE — Telephone Encounter (Signed)
Patient stopped by last evening with questions.  Rosemarie Ax and I discussed her questions related to Lupron/surgery plan and Rosemarie Ax will call her back with answers.  Patient asked Rosemarie Ax if you would write her a letter for work excusing her from heavy lifting. She said when she is on her period and does the heavy lifting at work that it makes her bleed even heavier and she has to go to the restroom a lot.  I probably will need to be specific about how much she is allowed to lift.

## 2016-04-01 NOTE — Telephone Encounter (Signed)
Please check with patient because if we limited the amount of lifting she could do her company may not put her in another job description and may required for her to go on disability. Left me know

## 2016-04-01 NOTE — Telephone Encounter (Signed)
Desiree Krueger called her back but voice mail box is full and she could not leave a message for her.

## 2016-04-01 NOTE — Telephone Encounter (Signed)
Desiree Krueger spoke with patient. Patient asked if you could just indicate "light duty" in the note. 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 I can write letter for her if [facility]

## 2016-04-05 NOTE — Telephone Encounter (Signed)
Letter prepared. Rosemarie Ax called patient and left message.

## 2016-04-13 ENCOUNTER — Telehealth: Payer: Self-pay

## 2016-04-13 NOTE — Telephone Encounter (Signed)
Rosemarie Ax called and spoke with patient and let her know that I received correspondence from specialty pharmacy today that they cancelled med order on 04/11/16 due to not being able to contact patient. The memo said patient should contact them and provided phone number. The Patient told Rosemarie Ax that they had contacted her and were to be mailing her an application. I called the number provided on behalf of the patient. There was confusion at the pharmacy and the rep said she needed to send the Rx back through for verification. She said someone would contact patient. I confirmed that someone speaking Spanish would contact patient and also confirmed her phone number. Rosemarie Ax will let patient know that someone will be calling her.

## 2016-05-02 NOTE — Telephone Encounter (Signed)
I called [facility] at [phone] to follow up on Lupron. The Rep who answered could not help me but said she would send an e-mail to the appropriate department. I offered her my direct phone number but she said that they would be contacting patient.

## 2016-05-11 ENCOUNTER — Telehealth: Payer: Self-pay

## 2016-05-11 NOTE — Telephone Encounter (Signed)
I called [facility] again because I have not heard from them about her Lupron. Rep said it is ready and waiting for verbal consent from the patient to ship it.  Rosemarie Ax will contact patient and let her know and provide the phone number to call.

## 2016-06-24 ENCOUNTER — Telehealth: Payer: Self-pay

## 2016-06-24 NOTE — Telephone Encounter (Signed)
I called [facility] to see where we are with her Lupron order from August. They called pharmacy while I held and came back and said patient could not afford $391.94.  She said she provided the pharmacy a copayment assistance card that should reduce the cost by $250.00 making is $141.91.  The pharmacy "will run it through and contact the patient".

## 2016-06-30 ENCOUNTER — Telehealth: Payer: Self-pay

## 2016-06-30 NOTE — Telephone Encounter (Signed)
I called to follow up on Lupron order from August.  I spoke with [facility] and they were of no assistance whatsoever. I asked to be able to call the pharmacy directly. I spoke with Laurance Flatten this afternoon at the pharmacy and she was most helpful trying to figure out what is going on. She did locate the patient's assistance sheets in the system and will call copay assistance and inquire. She has my direct number and will call me if needed or to confirm shipment.

## 2016-07-01 NOTE — Telephone Encounter (Signed)
Patient called today to follow up. She told Rosemarie Ax someone called her yesterday afternoon and told her her Lupron cost is $250.  I told Rosemarie Ax to tell her to hang on. I don't think that with just finding her financial assistance forms for free med middday that they were able to process them for grant money for her. I had called several places and several different people had their hands in this. I apologized for all the confusion. That is the 3rd quote we have received for a varying amount. Claudia told her to call me back in a week if no word.

## 2016-07-06 ENCOUNTER — Telehealth: Payer: Self-pay

## 2016-07-06 NOTE — Telephone Encounter (Signed)
Claudia sent me a note today that they had been in touch with patient and patient has opted to proceed and said they will be shipping her Lupron to [place].

## 2016-07-06 NOTE — Telephone Encounter (Signed)
I returned call to [place]. I was on the phone 33 minutes with a man who eventually said she had arranged Lupron shipment for 07/12/16. Patient notified by Rosemarie Ax that we will call her when it arrives and if we do not call her Tuesday to call us.

## 2016-07-13 ENCOUNTER — Ambulatory Visit: Payer: BLUE CROSS/BLUE SHIELD | Admitting: *Deleted

## 2016-07-13 ENCOUNTER — Telehealth: Payer: Self-pay

## 2016-07-13 ENCOUNTER — Other Ambulatory Visit: Payer: Self-pay | Admitting: Gynecology

## 2016-07-13 DIAGNOSIS — D219 Benign neoplasm of connective and other soft tissue, unspecified: Secondary | ICD-10-CM

## 2016-07-13 DIAGNOSIS — D500 Iron deficiency anemia secondary to blood loss (chronic): Secondary | ICD-10-CM

## 2016-07-13 DIAGNOSIS — D250 Submucous leiomyoma of uterus: Secondary | ICD-10-CM

## 2016-07-13 DIAGNOSIS — N920 Excessive and frequent menstruation with regular cycle: Secondary | ICD-10-CM

## 2016-07-13 MED ORDER — LEUPROLIDE ACETATE (3 MONTH) 11.25 MG IM KIT
11.2500 mg | PACK | Freq: Once | INTRAMUSCULAR | Status: AC
  Administered 2016-07-13: 11.25 mg via INTRAMUSCULAR

## 2016-07-13 NOTE — Telephone Encounter (Signed)
Patient is here and had Lupron 11.25 mg injection today. She asked Rosemarie Ax when she gets the next one. Per Dr. 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 I would like to give her a shot of Lupron 11.25 mg IM and repeat her SHGM in 3 mos before scheduling resectoscopic myomectomy if not she will need a vaginal hysterectomy".  Patient was advised of this. Recall placed for 3 mos for [facility] and order placed. She will call Rosemarie Ax once she knows about new ins and she has lost her job and we will check benefits for [employer].

## 2016-08-22 ENCOUNTER — Ambulatory Visit (INDEPENDENT_AMBULATORY_CARE_PROVIDER_SITE_OTHER): Payer: BLUE CROSS/BLUE SHIELD | Admitting: Gynecology

## 2016-08-22 ENCOUNTER — Encounter: Payer: Self-pay | Admitting: Gynecology

## 2016-08-22 VITALS — BP 126/80 | Ht 62.0 in | Wt 118.0 lb

## 2016-08-22 DIAGNOSIS — Z113 Encounter for screening for infections with a predominantly sexual mode of transmission: Secondary | ICD-10-CM | POA: Diagnosis not present

## 2016-08-22 DIAGNOSIS — B9689 Other specified bacterial agents as the cause of diseases classified elsewhere: Secondary | ICD-10-CM

## 2016-08-22 DIAGNOSIS — N898 Other specified noninflammatory disorders of vagina: Secondary | ICD-10-CM

## 2016-08-22 DIAGNOSIS — N938 Other specified abnormal uterine and vaginal bleeding: Secondary | ICD-10-CM | POA: Diagnosis not present

## 2016-08-22 DIAGNOSIS — D250 Submucous leiomyoma of uterus: Secondary | ICD-10-CM | POA: Diagnosis not present

## 2016-08-22 DIAGNOSIS — D500 Iron deficiency anemia secondary to blood loss (chronic): Secondary | ICD-10-CM

## 2016-08-22 DIAGNOSIS — N760 Acute vaginitis: Secondary | ICD-10-CM

## 2016-08-22 LAB — CBC WITH DIFFERENTIAL/PLATELET
Basophils Absolute: 50 cells/uL (ref 0–200)
Basophils Relative: 1 %
Eosinophils Absolute: 150 cells/uL (ref 15–500)
Eosinophils Relative: 3 %
HCT: 37.8 % (ref 35.0–45.0)
HEMOGLOBIN: 12.5 g/dL (ref 11.7–15.5)
LYMPHS ABS: 2000 {cells}/uL (ref 850–3900)
Lymphocytes Relative: 40 %
MCH: 30.3 pg (ref 27.0–33.0)
MCHC: 33.1 g/dL (ref 32.0–36.0)
MCV: 91.7 fL (ref 80.0–100.0)
MONO ABS: 300 {cells}/uL (ref 200–950)
MPV: 10.2 fL (ref 7.5–12.5)
Monocytes Relative: 6 %
NEUTROS PCT: 50 %
Neutro Abs: 2500 cells/uL (ref 1500–7800)
Platelets: 193 10*3/uL (ref 140–400)
RBC: 4.12 MIL/uL (ref 3.80–5.10)
RDW: 14.8 % (ref 11.0–15.0)
WBC: 5 10*3/uL (ref 3.8–10.8)

## 2016-08-22 LAB — WET PREP FOR TRICH, YEAST, CLUE
TRICH WET PREP: NONE SEEN
Yeast Wet Prep HPF POC: NONE SEEN

## 2016-08-22 MED ORDER — TINIDAZOLE 500 MG PO TABS: ORAL_TABLET | ORAL | 0 refills | Status: DC

## 2016-08-22 NOTE — Addendum Note (Signed)
Addended by: Burnett Kanaris on: 08/22/2016 04:10 PM   Modules accepted: Orders

## 2016-08-22 NOTE — Patient Instructions (Signed)
Vaginosis bacteriana (Bacterial Vaginosis) La vaginosis bacteriana es una infeccin vaginal que perturba el equilibrio normal de las bacterias que se encuentran en la vagina. Es el resultado de un crecimiento excesivo de ciertas bacterias. Esta es la infeccin vaginal ms frecuente en mujeres en edad reproductiva. El tratamiento es importante para prevenir complicaciones, especialmente en mujeres embarazadas, dado que puede causar un parto prematuro. CAUSAS La vaginosis bacteriana se origina por un aumento de bacterias nocivas que, generalmente, estn presentes en cantidades ms pequeas en la vagina. Varios tipos diferentes de bacterias pueden causar esta afeccin. Sin embargo, la causa de su desarrollo no se comprende totalmente. FACTORES DE RIESGO Ciertas actividades o comportamientos pueden exponerlo a un mayor riesgo de desarrollar vaginosis bacteriana, entre los que se incluyen:  Tener una nueva pareja sexual o mltiples parejas sexuales.  Las duchas vaginales  El uso del DIU (dispositivo intrauterino) como mtodo anticonceptivo. El contagio no se produce en baos, por ropas de cama, en piscinas o por contacto con objetos. SIGNOS Y SNTOMAS Algunas mujeres que padecen vaginosis bacteriana no presentan signos ni sntomas. Los sntomas ms comunes son:  Secrecin vaginal de color grisceo.  Secrecin vaginal con olor similar al pescado, especialmente despus de mantener relaciones sexuales.  Picazn o sensacin de ardor en la vagina o la vulva.  Ardor o dolor al orinar. DIAGNSTICO Su mdico analizar su historia clnica y le examinar la vagina para detectar signos de vaginosis bacteriana. Puede tomarle una muestra de flujo vaginal. Su mdico examinar esta muestra con un microscopio para controlar las bacterias y clulas anormales. Tambin puede realizarse un anlisis del pH vaginal. TRATAMIENTO La vaginosis bacteriana puede tratarse con antibiticos, en forma de comprimidos o de  crema vaginal. Puede indicarse una segunda tanda de antibiticos si la afeccin se repite despus del tratamiento. Debido a que la vaginosis bacteriana aumenta el riesgo de contraer enfermedades de transmisin sexual, el tratamiento puede ayudar a reducir el riesgo de clamidia, gonorrea, VIH y herpes. INSTRUCCIONES PARA EL CUIDADO EN EL HOGAR  Tome solo medicamentos de venta libre o recetados, segn las indicaciones del mdico.  Si le han recetado antibiticos, tmelos como se le indic. Asegrese de que finaliza la prescripcin completa aunque se sienta mejor.  Comunique a sus compaeros sexuales que sufre una infeccin vaginal. Deben consultar a su mdico y recibir tratamiento si tienen problemas, como picazn o una erupcin cutnea leve.  Durante el tratamiento, es importante que siga estas indicaciones:  Evite mantener relaciones sexuales o use preservativos de la forma correcta.  No se haga duchas vaginales.  Evite consumir alcohol como se lo haya indicado el mdico.  Evite amamantar como se lo haya indicado el mdico. SOLICITE ATENCIN MDICA SI:  Sus sntomas no mejoran despus de 3 das de tratamiento.  Aumenta la secrecin o el dolor.  Tiene fiebre. ASEGRESE DE QUE:  Comprende estas instrucciones.  Controlar su afeccin.  Recibir ayuda de inmediato si no mejora o si empeora. PARA OBTENER MS INFORMACIN Centros para el control y la prevencin de enfermedades (Centers for Disease Control and Prevention, CDC): www.cdc.gov/std Asociacin Estadounidense de la Salud Sexual (American Sexual Health Association, SHA): www.ashastd.org Esta informacin no tiene como fin reemplazar el consejo del mdico. Asegrese de hacerle al mdico cualquier pregunta que tenga. Document Released: 11/08/2007 Document Revised: 08/22/2014 Document Reviewed: 03/13/2013 Elsevier Interactive Patient Education  2017 Elsevier Inc.  

## 2016-08-22 NOTE — Progress Notes (Signed)
   Patient is a 46 year old gravida 4 para 4 who was last seen the office in August of this year complaining of an 8 month history of irregular bleeding she subsequently underwent an endometrial biopsy was benign and had a sonohysterogram which demonstrated a large submucous myoma see previous note for details. Since the patient did not want to succumb to a hysterectomy at the present time she was treated with Lupron 11.25 mg IM on 07/13/2016. She stated that her menstrual cycle and December 9 glasses close to 2 weeks and some brownish discharge which now has stopped. She is not had any further bleeding since then. She is currently on iron supplementation as a result of her chronic anemia. She subsequently developed a yellow discharge with fishy odor and had over-the-counter metronidazole which she used for one day but continues with the symptoms. She has not been sexually active in over 7 months.  Exam: Abdomen: Soft nontender no rebound or guarding Pelvic: Bartholin urethra Skene was within normal limits Vagina: Clear fishy odor discharge noted Cervix: No gross lesions on inspection Uterus: Anteverted normal size shape and consistency Adnexa: No palpable mass or tenderness Rectal exam not done  Wet prep: Few clue cells, moderate WBC, too numerous to count bacteria.  Assessment/plan: Patient with past history dysfunction uterine bleeding large submucous myoma receive Lupron 11.25 mg on 07/13/2016 and is scheduled for follow-up sonohysterogram first week in March to see of the myoma has decreased in size of that we can proceed and schedule outpatient resectoscopic myomectomy. She has otherwise tolerated medication well. She will continue on iron supplementation daily we'll check her CBC today. For her bacterial vaginosis she'll be treated with Tindamax 500 mg tablet for which she will take 4 tablets today and for talus tomorrow. A GC and Chlamydia culture was also obtained.

## 2016-08-24 LAB — GC/CHLAMYDIA PROBE AMP
CT Probe RNA: NOT DETECTED
GC PROBE AMP APTIMA: NOT DETECTED

## 2016-09-05 ENCOUNTER — Telehealth: Payer: Self-pay | Admitting: *Deleted

## 2016-09-05 MED ORDER — CLINDAMYCIN PHOSPHATE 2 % VA CREA: TOPICAL_CREAM | VAGINAL | 0 refills | Status: DC

## 2016-09-05 MED ORDER — FLUCONAZOLE 150 MG PO TABS: 150.0000 mg | ORAL_TABLET | Freq: Once | ORAL | 0 refills | Status: AC

## 2016-09-05 NOTE — Telephone Encounter (Signed)
Pt was treated with Tindamax 500 mg # 8 tablets on OV 08/22/16, pt is still c/o yellowish discharge, same as noted on OV, asked if refill should be given? No odor, or itching. Please advise

## 2016-09-05 NOTE — Telephone Encounter (Signed)
Will have claudia relay as pt is spanish speaking, Rx sent.

## 2016-09-05 NOTE — Telephone Encounter (Signed)
Call in prescription for Cleocin vaginal cream to apply daily at bedtime for one week and 1 Diflucan 150 mg by mouth

## 2016-09-29 ENCOUNTER — Other Ambulatory Visit: Payer: Self-pay | Admitting: Gynecology

## 2016-09-29 DIAGNOSIS — D250 Submucous leiomyoma of uterus: Secondary | ICD-10-CM

## 2016-10-13 ENCOUNTER — Ambulatory Visit (INDEPENDENT_AMBULATORY_CARE_PROVIDER_SITE_OTHER): Payer: BLUE CROSS/BLUE SHIELD

## 2016-10-13 ENCOUNTER — Ambulatory Visit (INDEPENDENT_AMBULATORY_CARE_PROVIDER_SITE_OTHER): Payer: BLUE CROSS/BLUE SHIELD | Admitting: Gynecology

## 2016-10-13 ENCOUNTER — Encounter: Payer: Self-pay | Admitting: Gynecology

## 2016-10-13 DIAGNOSIS — D500 Iron deficiency anemia secondary to blood loss (chronic): Secondary | ICD-10-CM

## 2016-10-13 DIAGNOSIS — N920 Excessive and frequent menstruation with regular cycle: Secondary | ICD-10-CM | POA: Diagnosis not present

## 2016-10-13 DIAGNOSIS — D250 Submucous leiomyoma of uterus: Secondary | ICD-10-CM

## 2016-10-13 DIAGNOSIS — D219 Benign neoplasm of connective and other soft tissue, unspecified: Secondary | ICD-10-CM

## 2016-10-13 DIAGNOSIS — N938 Other specified abnormal uterine and vaginal bleeding: Secondary | ICD-10-CM

## 2016-10-13 NOTE — Progress Notes (Signed)
  Patient is a 46 year old gravida 4 para 4 who presented for her 3 month follow-up after having received Lupron 11.25 mg IM secondary to large submucous myoma contributing to dysfunctional uterine bleeding and chronic anemia. See previous note dated January eighth for details. Patient has been on iron tablet one tablet daily. August 2017 patient had a benign endometrial biopsy. Her hemoglobin in July 2017 with 10.7 since she received Lupron and taking her iron supplementation daily her last CBC January 8 of this year was up to 12.5 g. She is currently not bleeding.   For comparison: Sonohysterogram 03/26/2016 before she received the Lupron 11.25 mg daily her submucous myoma measuring 19 x 18 x 2 mm.  Sonohysterogram today: Uterus measures 7.7 x 5.2 x 3.7 mm a right ovarian follicle measured 14 mm. Left ovary was normal. The cervix was cleansed with Betadine solution a sterile catheter was introduced into the cavity and a left anterior submucous myoma measuring 18 x 16 x 15 mm was noted.  Assessment/plan: It appears that the submucous myoma and started to decrease in size. I've explained to the patient that according to the medical literature after 6 months of Lupron fibroids typically will regress in size to approximate 50%. I'm recommended she receive 1 more dose before proceeding with resectoscopic myomectomy to allow more room for the instrument to be introduced and safely remove the submucous myoma. She will continue her iron supplementation daily. She'll return back to the office in 3 months for follow-up sonohysterogram and schedule surgery accordingly. I've instructed also to take calcium supplement daily. In the event of any vasomotor symptoms she stated by at the health food store Peppermint oil to apply behind each ear when necessary.

## 2016-11-03 ENCOUNTER — Ambulatory Visit: Payer: BLUE CROSS/BLUE SHIELD | Admitting: Gynecology

## 2016-12-19 ENCOUNTER — Other Ambulatory Visit: Payer: Self-pay | Admitting: Gynecology

## 2016-12-21 ENCOUNTER — Telehealth: Payer: Self-pay

## 2016-12-21 NOTE — Telephone Encounter (Signed)
Will need consult to discuss options such as hysterectomy

## 2016-12-21 NOTE — Telephone Encounter (Signed)
Claudia called patient and advised.

## 2016-12-21 NOTE — Telephone Encounter (Signed)
Patient called because she has been going through Patent examiner for Lupron. She was denied. She cannot afford the $600 is will cost her out of her pocket. She asked is there something else you can recommend?

## 2016-12-27 ENCOUNTER — Ambulatory Visit (INDEPENDENT_AMBULATORY_CARE_PROVIDER_SITE_OTHER): Payer: BLUE CROSS/BLUE SHIELD | Admitting: Gynecology

## 2016-12-27 ENCOUNTER — Encounter: Payer: Self-pay | Admitting: Gynecology

## 2016-12-27 VITALS — BP 98/60 | Ht 62.0 in | Wt 123.4 lb

## 2016-12-27 DIAGNOSIS — D500 Iron deficiency anemia secondary to blood loss (chronic): Secondary | ICD-10-CM | POA: Diagnosis not present

## 2016-12-27 DIAGNOSIS — D250 Submucous leiomyoma of uterus: Secondary | ICD-10-CM

## 2016-12-27 DIAGNOSIS — D252 Subserosal leiomyoma of uterus: Secondary | ICD-10-CM | POA: Diagnosis not present

## 2016-12-27 DIAGNOSIS — N938 Other specified abnormal uterine and vaginal bleeding: Secondary | ICD-10-CM

## 2016-12-27 LAB — CBC WITH DIFFERENTIAL/PLATELET
BASOS ABS: 44 {cells}/uL (ref 0–200)
BASOS PCT: 1 %
EOS PCT: 4 %
Eosinophils Absolute: 176 cells/uL (ref 15–500)
HCT: 38 % (ref 35.0–45.0)
HEMOGLOBIN: 12.6 g/dL (ref 11.7–15.5)
LYMPHS ABS: 1716 {cells}/uL (ref 850–3900)
Lymphocytes Relative: 39 %
MCH: 29.9 pg (ref 27.0–33.0)
MCHC: 33.2 g/dL (ref 32.0–36.0)
MCV: 90.3 fL (ref 80.0–100.0)
MONOS PCT: 9 %
MPV: 10.3 fL (ref 7.5–12.5)
Monocytes Absolute: 396 cells/uL (ref 200–950)
NEUTROS ABS: 2068 {cells}/uL (ref 1500–7800)
Neutrophils Relative %: 47 %
PLATELETS: 203 10*3/uL (ref 140–400)
RBC: 4.21 MIL/uL (ref 3.80–5.10)
RDW: 12.8 % (ref 11.0–15.0)
WBC: 4.4 10*3/uL (ref 3.8–10.8)

## 2016-12-27 NOTE — Progress Notes (Signed)
   Patient presented to the office to discuss alternatives to her Lupron therapy since she received only 1 injection and it was too costly to receive the second injection. Her history is as follows:  Patient is a 46 year old gravida 4 para 4 who and of December received a shot of Lupron 11.25 mg IM secondary to large submucous myoma contributing to dysfunctional uterine bleeding and chronic anemia. See previous note dated January eighth for details. Patient has been on iron tablet one tablet daily. August 2017 patient had a benign endometrial biopsy. Her hemoglobin in July 2017 with 10.7 since she received Lupron and taking her iron supplementation daily her last CBC January 8 of this year was up to 12.5 g. She is currently not bleeding.   For comparison: Sonohysterogram 03/26/2016 before she received the Lupron 11.25 mg daily her submucous myoma measuring 19 x 18 x 2 mm.  Sonohysterogram 10/13/2016:: Uterus measures 7.7 x 5.2 x 3.7 mm a right ovarian follicle measured 14 mm. Left ovary was normal. The cervix was cleansed with Betadine solution a sterile catheter was introduced into the cavity and a left anterior submucous myoma measuring 18 x 16 x 15 mm was noted.  Patient has not received another injection of the Lupron and I have recommended that she return to the office next week for sonohysterogram to see of the submucous myoma has decreased in size so that we may proceed with a resectoscopic myomectomy. If not we also discussed putting her on continuous oral contraceptive pill since she has issues right now trying to schedule surgery because of personal commitments. We will check her CBC today as well. We did discuss that at time of the sonohysterogram we will do an endometrial biopsy. We also discussed the alternative option would be to proceed with a transvaginal hysterectomy with bilateral salpingectomy and ovarian conservation. We'll discuss further when she returns back to the office next  week for the above tests and literature information was provided.  Greater than 90% time was spent counseling and coordinating care for this patient. Time of consultation 15 minutes

## 2016-12-27 NOTE — Patient Instructions (Signed)
Histerectomía vaginal, cuidados posteriores  (Vaginal Hysterectomy, Care After)  Siga estas instrucciones durante las próximas semanas. Estas indicaciones le proporcionan información acerca de cómo deberá cuidarse después del procedimiento. El médico también podrá darle instrucciones más específicas. El tratamiento ha sido planificado según las prácticas médicas actuales, pero en algunos casos pueden ocurrir problemas. Comuníquese con el médico si tiene algún problema o dudas después del procedimiento.  QUÉ ESPERAR DESPUÉS DEL PROCEDIMIENTO  Después del procedimiento, es común tener los siguientes síntomas:  · Dolor.  · Dolor y adormecimiento en las zonas de las incisiones.  · Hemorragia y secreción vaginal.  · Estreñimiento.  · Problemas transitorios para orinar.  · Sensación de tristeza u otras emociones.  INSTRUCCIONES PARA EL CUIDADO EN EL HOGAR  Medicamentos  · Tome los medicamentos de venta libre y los recetados solamente como se lo haya indicado el médico.  · Si le recetaron un antibiótico, tómelo como se lo haya indicado el médico. No deje de tomar los antibióticos aunque comience a sentirse mejor.  · No conduzca ni opere maquinaria pesada mientras toma analgésicos recetados.  Actividad  · Reanude sus actividades normales como se lo haya indicado el médico. Pregúntele al médico qué actividades son seguras para usted.  · Haga actividad física habitualmente como se lo haya indicado el médico. Es posible que le indiquen que haga caminatas cortas todos los días y que aumente las distancias gradualmente.  · No levante ningún objeto que pese más de 10 libras (4,5 kg).  Instrucciones generales  · No se introduzca nada en la vagina durante 6 semanas después de la cirugía o como se lo haya indicado el médico. Esto incluye los tampones y las duchas vaginales.  · No tenga relaciones sexuales hasta que el médico lo autorice.  · No tome baños de inmersión, no nade ni use el jacuzzi hasta que el médico lo  autorice.  · Beba suficiente líquido para mantener la orina clara o de color amarillo pálido.  · No conduzca durante 24 horas si le administraron un sedante.  · Concurra a todas las visitas de control como se lo haya indicado el médico. Esto es importante.  SOLICITE ATENCIÓN MÉDICA SI:  · El medicamento no le calma el dolor.  · Tiene fiebre.  · Tiene enrojecimiento, hinchazón o dolor en el lugar de la incisión.  · Tiene secreción de sangre, pus o flujo con mal olor que provienen de la vagina.  · Sigue teniendo dificultad para orinar.    SOLICITE ATENCIÓN MÉDICA DE INMEDIATO SI:  · Siente dolor abdominal o dolor de espalda intenso.  · Tiene hemorragia abundante de la vagina.  · Siente falta de aire o dolor en el pecho.    Esta información no tiene como fin reemplazar el consejo del médico. Asegúrese de hacerle al médico cualquier pregunta que tenga.  Document Released: 11/23/2015 Document Revised: 11/23/2015 Document Reviewed: 08/16/2015  Elsevier Interactive Patient Education © 2017 Elsevier Inc.

## 2016-12-28 ENCOUNTER — Encounter: Payer: Self-pay | Admitting: Gynecology

## 2016-12-30 ENCOUNTER — Telehealth: Payer: Self-pay

## 2016-12-30 ENCOUNTER — Other Ambulatory Visit: Payer: Self-pay | Admitting: Gynecology

## 2016-12-30 MED ORDER — PHENAZOPYRIDINE HCL 200 MG PO TABS: ORAL_TABLET | ORAL | 0 refills | Status: DC

## 2016-12-30 NOTE — Telephone Encounter (Signed)
Patient called. Wants to proceed with scheduling surgery.  Scheduled her for 01/31/17 at 10:30am at [facility].  Rosemarie Ax will call her and relay this to her and schedule pre op appt.  Rosemarie Ax will inform her regarding need for Pyridium one hs pm before surgery and one am of surgery with a tiny sip of water.  Rx has been sent.

## 2017-01-02 ENCOUNTER — Telehealth: Payer: Self-pay

## 2017-01-02 ENCOUNTER — Encounter: Payer: Self-pay | Admitting: Gynecology

## 2017-01-02 MED ORDER — TRAMADOL HCL 50 MG PO TABS: ORAL_TABLET | ORAL | 0 refills | Status: DC

## 2017-01-02 NOTE — Telephone Encounter (Signed)
You can call her in prescription for Ultram 50 mg and she can take one V recommended for the sonohysterogram in the morning before she comes to the office and I will give her a paracervical block as well

## 2017-01-02 NOTE — Telephone Encounter (Signed)
Desiree Krueger scheduled patient for [facility].  Patient said she thought the [facility] was horrible last time and asked if you could use local anesthetic this time or give her an Rx for pain?

## 2017-01-10 ENCOUNTER — Other Ambulatory Visit: Payer: Self-pay | Admitting: Gynecology

## 2017-01-10 DIAGNOSIS — N9489 Other specified conditions associated with female genital organs and menstrual cycle: Secondary | ICD-10-CM

## 2017-01-11 DIAGNOSIS — E0590 Thyrotoxicosis, unspecified without thyrotoxic crisis or storm: Secondary | ICD-10-CM | POA: Insufficient documentation

## 2017-01-11 DIAGNOSIS — Z79899 Other long term (current) drug therapy: Secondary | ICD-10-CM | POA: Insufficient documentation

## 2017-01-11 DIAGNOSIS — K219 Gastro-esophageal reflux disease without esophagitis: Secondary | ICD-10-CM | POA: Insufficient documentation

## 2017-01-11 DIAGNOSIS — Z8639 Personal history of other endocrine, nutritional and metabolic disease: Secondary | ICD-10-CM | POA: Insufficient documentation

## 2017-01-23 ENCOUNTER — Telehealth: Payer: Self-pay

## 2017-01-23 ENCOUNTER — Other Ambulatory Visit: Payer: BLUE CROSS/BLUE SHIELD

## 2017-01-23 ENCOUNTER — Ambulatory Visit: Payer: BLUE CROSS/BLUE SHIELD | Admitting: Gynecology

## 2017-01-23 NOTE — Telephone Encounter (Signed)
Will not proceed with surgery until sonohysterogram and endometrial biopsy report is back

## 2017-01-23 NOTE — Patient Instructions (Addendum)
Your procedure is scheduled on: Tuesday January 31, 2017 at 10:15  Enter through the Main Entrance of [facility] at: 8:45 am  Pick up the phone at the desk and dial [phone].  Call this number if you have problems the morning of surgery: [phone].  Remember: Do NOT eat food or drink clear fluids after: Midnight on Monday June 18  Take these medicines the morning of surgery with a SIP OF WATER: TAPAZOLE  Do not smoke on day of surgery.  Stop herbal medications and supplements at this time.  Do NOT wear jewelry (body piercing), metal hair clips/bobby pins, make-up, or nail polish. Do NOT wear lotions, powders, or perfumes.  You may wear deoderant. Do NOT shave for 48 hours prior to surgery. Do NOT bring valuables to the hospital. Contacts, dentures, or bridgework may not be worn into surgery.  Leave suitcase in car.  After surgery it may be brought to your room.  For patients admitted to the hospital, checkout time is 11:00 AM the day of discharge.

## 2017-01-23 NOTE — Telephone Encounter (Signed)
Desiree Krueger spoke with patient and sends this message: "Patients work may not give her time off for her surgery. Desiree Krueger already wrote letter for her employer but they will make decision this week to see if she can have the time off.  She is scheduled for shgm with endo bx today.  Can we rescheduled shgm without endo bx for next Monday and still have surgery on Tuesday?  Or does she have to have endo bx with results back before surgery?"

## 2017-01-23 NOTE — Telephone Encounter (Signed)
Per Dr. Moshe Salisbury patient does not have to have [facility] but will definitely need a pre op appt with him before surgery 01/31/17.  If she cannot have surgery we may want to reconsider the [facility].

## 2017-01-25 ENCOUNTER — Other Ambulatory Visit (HOSPITAL_COMMUNITY): Payer: Self-pay

## 2017-01-26 ENCOUNTER — Other Ambulatory Visit: Payer: Self-pay

## 2017-01-26 ENCOUNTER — Encounter (HOSPITAL_COMMUNITY): Payer: Self-pay

## 2017-01-26 ENCOUNTER — Encounter (HOSPITAL_COMMUNITY)
Admission: RE | Admit: 2017-01-26 | Discharge: 2017-01-26 | Disposition: A | Discharge status: Home / Self Care | Payer: BLUE CROSS/BLUE SHIELD | Source: Ambulatory Visit | Attending: Gynecology | Admitting: Gynecology

## 2017-01-26 ENCOUNTER — Ambulatory Visit: Payer: BLUE CROSS/BLUE SHIELD | Admitting: Gynecology

## 2017-01-26 DIAGNOSIS — D6489 Other specified anemias: Secondary | ICD-10-CM | POA: Insufficient documentation

## 2017-01-26 DIAGNOSIS — N938 Other specified abnormal uterine and vaginal bleeding: Secondary | ICD-10-CM | POA: Insufficient documentation

## 2017-01-26 DIAGNOSIS — Z01810 Encounter for preprocedural cardiovascular examination: Secondary | ICD-10-CM | POA: Diagnosis present

## 2017-01-26 DIAGNOSIS — D250 Submucous leiomyoma of uterus: Secondary | ICD-10-CM | POA: Insufficient documentation

## 2017-01-26 DIAGNOSIS — Z01812 Encounter for preprocedural laboratory examination: Secondary | ICD-10-CM | POA: Insufficient documentation

## 2017-01-26 HISTORY — DX: Cardiac arrhythmia, unspecified: I49.9

## 2017-01-26 HISTORY — DX: Gastro-esophageal reflux disease without esophagitis: K21.9

## 2017-01-26 HISTORY — DX: Headache: R51

## 2017-01-26 HISTORY — DX: Asymptomatic varicose veins of unspecified lower extremity: I83.90

## 2017-01-26 HISTORY — DX: Anemia, unspecified: D64.9

## 2017-01-26 HISTORY — DX: Major depressive disorder, single episode, unspecified: F32.9

## 2017-01-26 HISTORY — DX: Depression, unspecified: F32.A

## 2017-01-26 HISTORY — DX: Headache, unspecified: R51.9

## 2017-01-26 HISTORY — DX: Low back pain, unspecified: M54.50

## 2017-01-26 HISTORY — DX: Low back pain: M54.5

## 2017-01-26 LAB — BASIC METABOLIC PANEL
Anion gap: 6 (ref 5–15)
BUN: 15 mg/dL (ref 6–20)
CO2: 31 mmol/L (ref 22–32)
CREATININE: 0.67 mg/dL (ref 0.44–1.00)
Calcium: 9.4 mg/dL (ref 8.9–10.3)
Chloride: 99 mmol/L — ABNORMAL LOW (ref 101–111)
Glucose, Bld: 85 mg/dL (ref 65–99)
POTASSIUM: 3.7 mmol/L (ref 3.5–5.1)
SODIUM: 136 mmol/L (ref 135–145)

## 2017-01-26 LAB — CBC
HCT: 37.1 % (ref 36.0–46.0)
Hemoglobin: 12.6 g/dL (ref 12.0–15.0)
MCH: 30.7 pg (ref 26.0–34.0)
MCHC: 34 g/dL (ref 30.0–36.0)
MCV: 90.5 fL (ref 78.0–100.0)
PLATELETS: 225 10*3/uL (ref 150–400)
RBC: 4.1 MIL/uL (ref 3.87–5.11)
RDW: 12.5 % (ref 11.5–15.5)
WBC: 4.7 10*3/uL (ref 4.0–10.5)

## 2017-01-27 ENCOUNTER — Encounter: Payer: Self-pay | Admitting: Gynecology

## 2017-01-27 ENCOUNTER — Ambulatory Visit: Payer: BLUE CROSS/BLUE SHIELD | Admitting: Gynecology

## 2017-01-27 ENCOUNTER — Ambulatory Visit (INDEPENDENT_AMBULATORY_CARE_PROVIDER_SITE_OTHER): Payer: BLUE CROSS/BLUE SHIELD | Admitting: Gynecology

## 2017-01-27 VITALS — BP 110/80

## 2017-01-27 DIAGNOSIS — D250 Submucous leiomyoma of uterus: Secondary | ICD-10-CM | POA: Diagnosis not present

## 2017-01-27 DIAGNOSIS — Z01818 Encounter for other preprocedural examination: Secondary | ICD-10-CM

## 2017-01-27 DIAGNOSIS — D500 Iron deficiency anemia secondary to blood loss (chronic): Secondary | ICD-10-CM | POA: Diagnosis not present

## 2017-01-27 DIAGNOSIS — N920 Excessive and frequent menstruation with regular cycle: Secondary | ICD-10-CM | POA: Diagnosis not present

## 2017-01-27 NOTE — Progress Notes (Signed)
Desiree Krueger is an 46 y.o. female who presented to the office for her preop examination. Patient scheduled next week for resectoscopic myomectomy as a result of menorrhagia and anemia attributed to a submucous myoma.  Patient is a 46 year old gravida 4 para 4 who and of December received a shot of Lupron 11.25 mg IM secondary to large submucous myoma contributing to dysfunctional uterine bleeding and chronic anemia. See previous note dated January eighth for details. Patient has been on iron tablet one tablet daily. August 2017 patient had a benign endometrial biopsy. Her hemoglobin in July 2017 with 10.7 since she received Lupron and taking her iron supplementation daily her last CBC January 8 of this year was up to 12.5 g. She is currently not bleeding.  For comparison: Sonohysterogram 03/26/2016 before she received the Lupron 11.25 mg daily her submucous myoma measuring 21x15 mm  Sonohysterogram 10/13/2016:: Uterus measures 7.7 x 5.2 x 3.7 mm a right ovarian follicle measured 14 mm. Left ovary was normal. The cervix was cleansed with Betadine solution a sterile catheter was introduced into the cavity and a left anterior submucous myoma measuring 18 x 16 x 15 mm was noted.    Pertinent Gynecological History: Menses: menorrhagia Bleeding: menorrhagia  Contraception: tubal ligation DES exposure: unknown Blood transfusions: none Sexually transmitted diseases: no past history Previous GYN Procedures: 2 NSVD Last mammogram:  Date: 2018 Last pap: normal Date: 2017 OB History: G2, P2   Menstrual History: Menarche age: 77 Patient's last menstrual period was 01/08/2017. Period Pattern: (!) Irregular Menstrual Flow: Moderate Menstrual Control: Maxi pad, Tampon Dysmenorrhea: (!) Mild Dysmenorrhea Symptoms: Cramping  Past Medical History:  Diagnosis Date  . Anemia   . Depression   . Dysrhythmia   . GERD (gastroesophageal reflux disease)   . Headache    MIGRAINES  .  Hypothyroidism   . Lower back pain   . Thyroid disease   . Varicose vein of leg    BILATERAL    Past Surgical History:  Procedure Laterality Date  . TUBAL LIGATION      Family History  Problem Relation Age of Onset  . Cancer Maternal Uncle        COLON  . Hypertension Father     Social History:  reports that she has quit smoking. Her smoking use included Cigarettes. She quit after 6.00 years of use. She has never used smokeless tobacco. She reports that she drinks alcohol. She reports that she does not use drugs.  Allergies: No Known Allergies   (Not in a hospital admission)  REVIEW OF SYSTEMS: A ROS was performed and pertinent positives and negatives are included in the history.  GENERAL: No fevers or chills. HEENT: No change in vision, no earache, sore throat or sinus congestion. NECK: No pain or stiffness. CARDIOVASCULAR: No chest pain or pressure. No palpitations. PULMONARY: No shortness of breath, cough or wheeze. GASTROINTESTINAL: No abdominal pain, nausea, vomiting or diarrhea, melena or bright red blood per rectum. GENITOURINARY: No urinary frequency, urgency, hesitancy or dysuria. MUSCULOSKELETAL: No joint or muscle pain, no back pain, no recent trauma. DERMATOLOGIC: No rash, no itching, no lesions. ENDOCRINE: No polyuria, polydipsia, no heat or cold intolerance. No recent change in weight. HEMATOLOGICAL: No anemia or easy bruising or bleeding. NEUROLOGIC: No headache, seizures, numbness, tingling or weakness. PSYCHIATRIC: No depression, no loss of interest in normal activity or change in sleep pattern.     Blood pressure 110/80, last menstrual period 01/08/2017.  Physical Exam:  HEENT:unremarkable Neck:Supple, midline, no  thyroid megaly, no carotid bruits Lungs:  Clear to auscultation no rhonchi's or wheezes Heart:Regular rate and rhythm, no murmurs or gallops Breast Exam:Symmetrical in appearance no palpable masses or tenderness no supraclavicular axillary  lymphadenopathy Abdomen: Soft nontender no rebound or guarding Pelvic:BUS within normal limits Vagina: No lesions or discharge Cervix: No lesions or discharge Uterus: Slightly retroverted normal size shape nontender Adnexa: No palpable masses or tenderness Extremities: No cords, no edema Rectal: Unremarkable   Assessment/Plan: 46 her oral patient with submucous myoma was large and encroaching 3 forceps of the uterine cavity last year had been placed on Lupron 11.25 mg and received only 1 injection but insurance would not cover second injection so that she would have 6 months of coverage. When sonohysterogram's were compared it looked like it had decreased in size from 21 x 15 mm down to 18 x 16 x 15 mm. Instead of proceeding with a vaginal hysterectomy which restricted patient's time from work she would like to proceed with resectoscopic myomectomy. We discussed the possibility that we should be able to remove it at least 90% of this myoma. Her hemoglobin has returned back to normal she's having normal cycles but very heavy. She will continue on her iron supplementation. The risks benefits and pros and cons of the operation were discussed in Spanish as follows:                        Patient was counseled as to the risk of surgery to include the following:  1. Infection (prohylactic antibiotics will be administered)  2. DVT/Pulmonary Embolism (prophylactic pneumo compression stockings will be used)  3.Trauma to internal organs requiring additional surgical procedure to repair any injury to     Internal organs requiring perhaps additional hospitalization days.  4.Hemmorhage requiring transfusion and blood products which carry risks such as  anaphylactic reaction, hepatitis and AIDS  Patient had received literature information on the procedure scheduled and all her questions were answered and fully accepts all risk.   [facility] HMD3:56 PMTD     Terrance Mass 01/27/2017, 3:28  PM

## 2017-01-31 ENCOUNTER — Encounter (HOSPITAL_COMMUNITY): Payer: Self-pay

## 2017-01-31 ENCOUNTER — Encounter (HOSPITAL_COMMUNITY)
Admission: RE | Disposition: A | Discharge status: Home / Self Care | Payer: Self-pay | Source: Ambulatory Visit | Attending: Gynecology

## 2017-01-31 ENCOUNTER — Ambulatory Visit (HOSPITAL_COMMUNITY): Payer: BLUE CROSS/BLUE SHIELD | Admitting: Anesthesiology

## 2017-01-31 ENCOUNTER — Other Ambulatory Visit: Payer: Self-pay | Admitting: Gynecology

## 2017-01-31 ENCOUNTER — Ambulatory Visit (HOSPITAL_COMMUNITY)
Admission: RE | Admit: 2017-01-31 | Discharge: 2017-01-31 | Disposition: A | Discharge status: Home / Self Care | Payer: BLUE CROSS/BLUE SHIELD | Source: Ambulatory Visit | Attending: Gynecology | Admitting: Gynecology

## 2017-01-31 DIAGNOSIS — N920 Excessive and frequent menstruation with regular cycle: Secondary | ICD-10-CM

## 2017-01-31 DIAGNOSIS — Z8249 Family history of ischemic heart disease and other diseases of the circulatory system: Secondary | ICD-10-CM | POA: Diagnosis not present

## 2017-01-31 DIAGNOSIS — D649 Anemia, unspecified: Secondary | ICD-10-CM | POA: Insufficient documentation

## 2017-01-31 DIAGNOSIS — Z87891 Personal history of nicotine dependence: Secondary | ICD-10-CM | POA: Insufficient documentation

## 2017-01-31 DIAGNOSIS — E039 Hypothyroidism, unspecified: Secondary | ICD-10-CM | POA: Diagnosis not present

## 2017-01-31 DIAGNOSIS — K219 Gastro-esophageal reflux disease without esophagitis: Secondary | ICD-10-CM | POA: Insufficient documentation

## 2017-01-31 DIAGNOSIS — Z79899 Other long term (current) drug therapy: Secondary | ICD-10-CM | POA: Insufficient documentation

## 2017-01-31 DIAGNOSIS — Z800 Family history of malignant neoplasm of digestive organs: Secondary | ICD-10-CM | POA: Diagnosis not present

## 2017-01-31 DIAGNOSIS — D250 Submucous leiomyoma of uterus: Secondary | ICD-10-CM | POA: Insufficient documentation

## 2017-01-31 DIAGNOSIS — Z791 Long term (current) use of non-steroidal anti-inflammatories (NSAID): Secondary | ICD-10-CM | POA: Insufficient documentation

## 2017-01-31 DIAGNOSIS — I739 Peripheral vascular disease, unspecified: Secondary | ICD-10-CM | POA: Insufficient documentation

## 2017-01-31 DIAGNOSIS — F329 Major depressive disorder, single episode, unspecified: Secondary | ICD-10-CM | POA: Diagnosis not present

## 2017-01-31 DIAGNOSIS — D500 Iron deficiency anemia secondary to blood loss (chronic): Secondary | ICD-10-CM

## 2017-01-31 HISTORY — PX: DILATATION & CURETTAGE/HYSTEROSCOPY WITH MYOSURE: SHX6511

## 2017-01-31 LAB — PREGNANCY, URINE: PREG TEST UR: NEGATIVE

## 2017-01-31 SURGERY — DILATATION & CURETTAGE/HYSTEROSCOPY WITH MYOSURE
Anesthesia: General | Site: Vagina

## 2017-01-31 SURGERY — HYSTERECTOMY, VAGINAL
Anesthesia: General

## 2017-01-31 MED ORDER — ONDANSETRON HCL 4 MG/2ML IJ SOLN: 4.0000 mg | Freq: Four times a day (QID) | INTRAMUSCULAR | 0 refills | Status: DC | PRN

## 2017-01-31 MED ORDER — SCOPOLAMINE 1 MG/3DAYS TD PT72
1.0000 | MEDICATED_PATCH | Freq: Once | TRANSDERMAL | Status: DC
  Administered 2017-01-31: 1.5 mg via TRANSDERMAL

## 2017-01-31 MED ORDER — ONDANSETRON HCL 4 MG/2ML IJ SOLN
INTRAMUSCULAR | Status: DC | PRN
Start: 2017-01-31 — End: 2017-01-31
  Administered 2017-01-31: 4 mg via INTRAVENOUS

## 2017-01-31 MED ORDER — OXYCODONE HCL 5 MG PO TABS: 5.0000 mg | ORAL_TABLET | Freq: Once | ORAL | Status: DC | PRN

## 2017-01-31 MED ORDER — DEXAMETHASONE SODIUM PHOSPHATE 10 MG/ML IJ SOLN
INTRAMUSCULAR | Status: DC | PRN
  Administered 2017-01-31: 5 mg via INTRAVENOUS

## 2017-01-31 MED ORDER — DEXAMETHASONE SODIUM PHOSPHATE 10 MG/ML IJ SOLN
INTRAMUSCULAR | Status: AC
  Filled 2017-01-31: qty 1

## 2017-01-31 MED ORDER — FLUMAZENIL 0.5 MG/5ML IV SOLN
INTRAVENOUS | Status: AC
  Filled 2017-01-31: qty 5

## 2017-01-31 MED ORDER — MIDAZOLAM HCL 2 MG/2ML IJ SOLN
INTRAMUSCULAR | Status: DC | PRN
  Administered 2017-01-31: 1 mg via INTRAVENOUS

## 2017-01-31 MED ORDER — SODIUM CHLORIDE 0.9 % IR SOLN
Status: DC | PRN
  Administered 2017-01-31: 3000 mL

## 2017-01-31 MED ORDER — MIDAZOLAM HCL 2 MG/2ML IJ SOLN
INTRAMUSCULAR | Status: AC
  Filled 2017-01-31: qty 2

## 2017-01-31 MED ORDER — LACTATED RINGERS IV SOLN
INTRAVENOUS | Status: DC
  Administered 2017-01-31: 125 mL/h via INTRAVENOUS

## 2017-01-31 MED ORDER — ONDANSETRON HCL 4 MG/2ML IJ SOLN
INTRAMUSCULAR | Status: AC
  Filled 2017-01-31: qty 2

## 2017-01-31 MED ORDER — PROPOFOL 10 MG/ML IV BOLUS
INTRAVENOUS | Status: DC | PRN
  Administered 2017-01-31: 180 mg via INTRAVENOUS

## 2017-01-31 MED ORDER — ONDANSETRON HCL 4 MG/2ML IJ SOLN: 4.0000 mg | Freq: Four times a day (QID) | INTRAMUSCULAR | Status: DC | PRN

## 2017-01-31 MED ORDER — LIDOCAINE HCL (CARDIAC) 20 MG/ML IV SOLN
INTRAVENOUS | Status: AC
  Filled 2017-01-31: qty 5

## 2017-01-31 MED ORDER — SODIUM CHLORIDE 0.9 % IJ SOLN
INTRAMUSCULAR | Status: AC
  Filled 2017-01-31: qty 10

## 2017-01-31 MED ORDER — FENTANYL CITRATE (PF) 100 MCG/2ML IJ SOLN
INTRAMUSCULAR | Status: AC
  Filled 2017-01-31: qty 2

## 2017-01-31 MED ORDER — LIDOCAINE HCL (CARDIAC) 20 MG/ML IV SOLN
INTRAVENOUS | Status: DC | PRN
  Administered 2017-01-31: 50 mg via INTRAVENOUS

## 2017-01-31 MED ORDER — CEFOTETAN DISODIUM-DEXTROSE 2-2.08 GM-% IV SOLR
INTRAVENOUS | Status: AC
  Filled 2017-01-31: qty 50

## 2017-01-31 MED ORDER — SCOPOLAMINE 1 MG/3DAYS TD PT72
MEDICATED_PATCH | TRANSDERMAL | Status: AC
  Filled 2017-01-31: qty 1

## 2017-01-31 MED ORDER — VASOPRESSIN 20 UNIT/ML IV SOLN
INTRAVENOUS | Status: AC
  Filled 2017-01-31: qty 1

## 2017-01-31 MED ORDER — KETOROLAC TROMETHAMINE 30 MG/ML IJ SOLN
INTRAMUSCULAR | Status: DC | PRN
  Administered 2017-01-31: 30 mg via INTRAVENOUS

## 2017-01-31 MED ORDER — FENTANYL CITRATE (PF) 100 MCG/2ML IJ SOLN: 25.0000 ug | INTRAMUSCULAR | Status: DC | PRN

## 2017-01-31 MED ORDER — VASOPRESSIN 20 UNIT/ML IV SOLN
INTRAVENOUS | Status: DC | PRN
Start: 2017-01-31 — End: 2017-01-31
  Administered 2017-01-31: 10 [IU]

## 2017-01-31 MED ORDER — PROPOFOL 10 MG/ML IV BOLUS
INTRAVENOUS | Status: AC
  Filled 2017-01-31: qty 20

## 2017-01-31 MED ORDER — FENTANYL CITRATE (PF) 100 MCG/2ML IJ SOLN
INTRAMUSCULAR | Status: DC | PRN
  Administered 2017-01-31 (×2): 50 ug via INTRAVENOUS

## 2017-01-31 MED ORDER — FLUMAZENIL 0.5 MG/5ML IV SOLN
INTRAVENOUS | Status: DC | PRN
  Administered 2017-01-31: 0.2 mg via INTRAVENOUS

## 2017-01-31 MED ORDER — SODIUM CHLORIDE 0.9 % IJ SOLN
INTRAMUSCULAR | Status: AC
  Filled 2017-01-31: qty 20

## 2017-01-31 MED ORDER — OXYCODONE HCL 5 MG/5ML PO SOLN: 5.0000 mg | Freq: Once | ORAL | Status: DC | PRN

## 2017-01-31 MED ORDER — KETOROLAC TROMETHAMINE 30 MG/ML IJ SOLN
INTRAMUSCULAR | Status: AC
  Filled 2017-01-31: qty 1

## 2017-01-31 MED ORDER — MEGESTROL ACETATE 40 MG PO TABS: 40.0000 mg | ORAL_TABLET | Freq: Two times a day (BID) | ORAL | 0 refills | Status: DC

## 2017-01-31 MED ORDER — CEFOTETAN DISODIUM-DEXTROSE 2-2.08 GM-% IV SOLR
2.0000 g | INTRAVENOUS | Status: AC
  Administered 2017-01-31: 2 g via INTRAVENOUS

## 2017-01-31 SURGICAL SUPPLY — 23 items
CANISTER SUCT 3000ML PPV (MISCELLANEOUS) ×2 IMPLANT
CATH FOLEY 2WAY SLVR 30CC 16FR (CATHETERS) IMPLANT
CATH ROBINSON RED A/P 16FR (CATHETERS) ×2 IMPLANT
CLOTH BEACON ORANGE TIMEOUT ST (SAFETY) ×2 IMPLANT
CONTAINER PREFILL 10% NBF 60ML (FORM) ×4 IMPLANT
DEVICE MYOSURE LITE (MISCELLANEOUS) IMPLANT
DEVICE MYOSURE REACH (MISCELLANEOUS) IMPLANT
FILTER ARTHROSCOPY CONVERTOR (FILTER) ×2 IMPLANT
GLOVE BIOGEL PI IND STRL 7.0 (GLOVE) ×1 IMPLANT
GLOVE BIOGEL PI IND STRL 8 (GLOVE) ×1 IMPLANT
GLOVE BIOGEL PI INDICATOR 7.0 (GLOVE) ×1
GLOVE BIOGEL PI INDICATOR 8 (GLOVE) ×1
GLOVE ECLIPSE 7.5 STRL STRAW (GLOVE) ×4 IMPLANT
GOWN STRL REUS W/TWL LRG LVL3 (GOWN DISPOSABLE) ×4 IMPLANT
PACK VAGINAL MINOR WOMEN LF (CUSTOM PROCEDURE TRAY) ×2 IMPLANT
PAD OB MATERNITY 4.3X12.25 (PERSONAL CARE ITEMS) ×2 IMPLANT
PAD PREP 24X48 CUFFED NSTRL (MISCELLANEOUS) ×2 IMPLANT
PLUG CATH AND CAP STER (CATHETERS) IMPLANT
SEAL ROD LENS SCOPE MYOSURE (ABLATOR) ×2 IMPLANT
SYR 30ML LL (SYRINGE) IMPLANT
TOWEL OR 17X24 6PK STRL BLUE (TOWEL DISPOSABLE) ×4 IMPLANT
TUBING AQUILEX INFLOW (TUBING) ×2 IMPLANT
TUBING AQUILEX OUTFLOW (TUBING) ×2 IMPLANT

## 2017-01-31 NOTE — Interval H&P Note (Signed)
History and Physical Interval Note:  01/31/2017 9:14 AM  Desiree Krueger Jerold Coombe  has presented today for surgery, with the diagnosis of myoma  The various methods of treatment have been discussed with the patient and family. After consideration of risks, benefits and other options for treatment, the patient has consented to  Procedure(s): [place] (N/A) as a surgical intervention .  The patient's history has been reviewed, patient examined, no change in status, stable for surgery.  I have reviewed the patient's chart and labs.  Questions were answered to the patient's satisfaction.     Terrance Mass

## 2017-01-31 NOTE — Discharge Instructions (Addendum)
°  Post Anesthesia Home Care Instructions  NO IBUPROFEN PRODUCTS UNTIL: 4:15 PM TODAY  Activity: Get plenty of rest for the remainder of the day. A responsible individual must stay with you for 24 hours following the procedure.  For the next 24 hours, DO NOT: -Drive a car -Paediatric nurse -Drink alcoholic beverages -Take any medication unless instructed by your physician -Make any legal decisions or sign important papers.  Meals: Start with liquid foods such as gelatin or soup. Progress to regular foods as tolerated. Avoid greasy, spicy, heavy foods. If nausea and/or vomiting occur, drink only clear liquids until the nausea and/or vomiting subsides. Call your physician if vomiting continues.  Special Instructions/Symptoms: Your throat may feel dry or sore from the anesthesia or the breathing tube placed in your throat during surgery. If this causes discomfort, gargle with warm salt water. The discomfort should disappear within 24 hours.  If you had a scopolamine patch placed behind your ear for the management of post- operative nausea and/or vomiting:  1. The medication in the patch is effective for 72 hours, after which it should be removed.  Wrap patch in a tissue and discard in the trash. Wash hands thoroughly with soap and water. 2. You may remove the patch earlier than 72 hours if you experience unpleasant side effects which may include dry mouth, dizziness or visual disturbances. 3. Avoid touching the patch. Wash your hands with soap and water after contact with the patch.

## 2017-01-31 NOTE — Anesthesia Preprocedure Evaluation (Signed)
Anesthesia Evaluation  Patient identified by MRN, date of birth, ID band Patient awake    Reviewed: Allergy & Precautions, H&P , NPO status , Patient's Chart, lab work & pertinent test results  Airway Mallampati: II   Neck ROM: full    Dental   Pulmonary former smoker,    breath sounds clear to auscultation       Cardiovascular + Peripheral Vascular Disease   Rhythm:regular Rate:Normal     Neuro/Psych  Headaches, PSYCHIATRIC DISORDERS Depression    GI/Hepatic GERD  ,  Endo/Other  Hypothyroidism   Renal/GU      Musculoskeletal   Abdominal   Peds  Hematology   Anesthesia Other Findings   Reproductive/Obstetrics                             Anesthesia Physical Anesthesia Plan  ASA: II  Anesthesia Plan: General   Post-op Pain Management:    Induction: Intravenous  PONV Risk Score and Plan: 4 or greater and Ondansetron, Dexamethasone, Propofol, Midazolam and Scopolamine patch - Pre-op  Airway Management Planned: LMA  Additional Equipment:   Intra-op Plan:   Post-operative Plan:   Informed Consent: I have reviewed the patients History and Physical, chart, labs and discussed the procedure including the risks, benefits and alternatives for the proposed anesthesia with the patient or authorized representative who has indicated his/her understanding and acceptance.     Plan Discussed with: CRNA, Anesthesiologist and Surgeon  Anesthesia Plan Comments:         Anesthesia Quick Evaluation

## 2017-01-31 NOTE — H&P (View-Only) (Signed)
Desiree Krueger is an 46 y.o. female who presented to the office for her preop examination. Patient scheduled next week for resectoscopic myomectomy as a result of menorrhagia and anemia attributed to a submucous myoma.  Patient is a 46 year old gravida 4 para 4 who and of December received a shot of Lupron 11.25 mg IM secondary to large submucous myoma contributing to dysfunctional uterine bleeding and chronic anemia. See previous note dated January eighth for details. Patient has been on iron tablet one tablet daily. August 2017 patient had a benign endometrial biopsy. Her hemoglobin in July 2017 with 10.7 since she received Lupron and taking her iron supplementation daily her last CBC January 8 of this year was up to 12.5 g. She is currently not bleeding.  For comparison: Sonohysterogram 03/26/2016 before she received the Lupron 11.25 mg daily her submucous myoma measuring 21x15 mm  Sonohysterogram 10/13/2016:: Uterus measures 7.7 x 5.2 x 3.7 mm a right ovarian follicle measured 14 mm. Left ovary was normal. The cervix was cleansed with Betadine solution a sterile catheter was introduced into the cavity and a left anterior submucous myoma measuring 18 x 16 x 15 mm was noted.    Pertinent Gynecological History: Menses: menorrhagia Bleeding: menorrhagia  Contraception: tubal ligation DES exposure: unknown Blood transfusions: none Sexually transmitted diseases: no past history Previous GYN Procedures: 2 NSVD Last mammogram:  Date: 2018 Last pap: normal Date: 2017 OB History: G2, P2   Menstrual History: Menarche age: 8 Patient's last menstrual period was 01/08/2017. Period Pattern: (!) Irregular Menstrual Flow: Moderate Menstrual Control: Maxi pad, Tampon Dysmenorrhea: (!) Mild Dysmenorrhea Symptoms: Cramping  Past Medical History:  Diagnosis Date  . Anemia   . Depression   . Dysrhythmia   . GERD (gastroesophageal reflux disease)   . Headache    MIGRAINES  .  Hypothyroidism   . Lower back pain   . Thyroid disease   . Varicose vein of leg    BILATERAL    Past Surgical History:  Procedure Laterality Date  . TUBAL LIGATION      Family History  Problem Relation Age of Onset  . Cancer Maternal Uncle        COLON  . Hypertension Father     Social History:  reports that she has quit smoking. Her smoking use included Cigarettes. She quit after 6.00 years of use. She has never used smokeless tobacco. She reports that she drinks alcohol. She reports that she does not use drugs.  Allergies: No Known Allergies   (Not in a hospital admission)  REVIEW OF SYSTEMS: A ROS was performed and pertinent positives and negatives are included in the history.  GENERAL: No fevers or chills. HEENT: No change in vision, no earache, sore throat or sinus congestion. NECK: No pain or stiffness. CARDIOVASCULAR: No chest pain or pressure. No palpitations. PULMONARY: No shortness of breath, cough or wheeze. GASTROINTESTINAL: No abdominal pain, nausea, vomiting or diarrhea, melena or bright red blood per rectum. GENITOURINARY: No urinary frequency, urgency, hesitancy or dysuria. MUSCULOSKELETAL: No joint or muscle pain, no back pain, no recent trauma. DERMATOLOGIC: No rash, no itching, no lesions. ENDOCRINE: No polyuria, polydipsia, no heat or cold intolerance. No recent change in weight. HEMATOLOGICAL: No anemia or easy bruising or bleeding. NEUROLOGIC: No headache, seizures, numbness, tingling or weakness. PSYCHIATRIC: No depression, no loss of interest in normal activity or change in sleep pattern.     Blood pressure 110/80, last menstrual period 01/08/2017.  Physical Exam:  HEENT:unremarkable Neck:Supple, midline, no  thyroid megaly, no carotid bruits Lungs:  Clear to auscultation no rhonchi's or wheezes Heart:Regular rate and rhythm, no murmurs or gallops Breast Exam:Symmetrical in appearance no palpable masses or tenderness no supraclavicular axillary  lymphadenopathy Abdomen: Soft nontender no rebound or guarding Pelvic:BUS within normal limits Vagina: No lesions or discharge Cervix: No lesions or discharge Uterus: Slightly retroverted normal size shape nontender Adnexa: No palpable masses or tenderness Extremities: No cords, no edema Rectal: Unremarkable   Assessment/Plan: 46 her oral patient with submucous myoma was large and encroaching 3 forceps of the uterine cavity last year had been placed on Lupron 11.25 mg and received only 1 injection but insurance would not cover second injection so that she would have 6 months of coverage. When sonohysterogram's were compared it looked like it had decreased in size from 21 x 15 mm down to 18 x 16 x 15 mm. Instead of proceeding with a vaginal hysterectomy which restricted patient's time from work she would like to proceed with resectoscopic myomectomy. We discussed the possibility that we should be able to remove it at least 90% of this myoma. Her hemoglobin has returned back to normal she's having normal cycles but very heavy. She will continue on her iron supplementation. The risks benefits and pros and cons of the operation were discussed in Spanish as follows:                        Patient was counseled as to the risk of surgery to include the following:  1. Infection (prohylactic antibiotics will be administered)  2. DVT/Pulmonary Embolism (prophylactic pneumo compression stockings will be used)  3.Trauma to internal organs requiring additional surgical procedure to repair any injury to     Internal organs requiring perhaps additional hospitalization days.  4.Hemmorhage requiring transfusion and blood products which carry risks such as  anaphylactic reaction, hepatitis and AIDS  Patient had received literature information on the procedure scheduled and all her questions were answered and fully accepts all risk.   [facility] HMD3:56 PMTD     Terrance Mass 01/27/2017, 3:28  PM

## 2017-01-31 NOTE — Transfer of Care (Signed)
Immediate Anesthesia Transfer of Care Note  Patient: Desiree Krueger  Procedure(s) Performed: Procedure(s): DILATATION & CURETTAGE/HYSTEROSCOPY WITH MYOSURE (N/A)  Patient Location: PACU  Anesthesia Type:General  Level of Consciousness: awake, alert  and oriented  Airway & Oxygen Therapy: Patient Spontanous Breathing and Patient connected to nasal cannula oxygen  Post-op Assessment: Report given to RN and Post -op Vital signs reviewed and stable  Post vital signs: Reviewed and stable  Last Vitals:  Vitals:   01/31/17 0856  BP: 100/70  Pulse: 83  Resp: 16  Temp: 36.8 C    Last Pain:  Vitals:   01/31/17 0856  TempSrc: Oral      Patients Stated Pain Goal: 4 (02/77/41 2878)  Complications: No apparent anesthesia complications

## 2017-01-31 NOTE — Anesthesia Procedure Notes (Signed)
Procedure Name: LMA Insertion Date/Time: 01/31/2017 9:54 AM Performed by: Tobin Chad Pre-anesthesia Checklist: Patient identified, Emergency Drugs available, Suction available and Patient being monitored Patient Re-evaluated:Patient Re-evaluated prior to inductionOxygen Delivery Method: Circle system utilized and Simple face mask Preoxygenation: Pre-oxygenation with 100% oxygen Intubation Type: IV induction Ventilation: Mask ventilation without difficulty LMA Size: 4.0 Grade View: Grade II Number of attempts: 1 Placement Confirmation: positive ETCO2 and breath sounds checked- equal and bilateral Dental Injury: Teeth and Oropharynx as per pre-operative assessment

## 2017-01-31 NOTE — Op Note (Signed)
   Operative Note  01/31/2017  10:55 AM  PATIENT:  Desiree Krueger  46 y.o. female  PRE-OPERATIVE DIAGNOSIS: Submucous  Myoma, anemia, menorrhagia  POST-OPERATIVE DIAGNOSIS: Submucous myoma, anemia and menorrhagia  PROCEDURE:  Procedure(s): DILATATION & CURETTAGE/HYSTEROSCOPY WITH MYOSURE  SURGEON:  Surgeon(s): Terrance Mass, MD  ANESTHESIA:   general  FINDINGS: FINDINGS: Large submucous myoma, incorporating 3 forceps of the uterine cavity with its attachment at the 1:00 position anterior uterine wall vascular. Both 2 loss identified cervical canal clear.   DESCRIPTION OF OPERATION:The patient was taken to the operating room where she underwent a successful general endotracheal anesthesia. Patient had PAS stockings for DVT prophylaxis. She received 2 g of Cefotan preop. Time out was undertaken to properly identify the patient and the proper operation schedule. The vagina and perineum were prepped and draped in usual sterile fashion. A red rubber Quentin Cornwall was inserted to empty the bladder is content for approximately 50 cc. Bimanual examination demonstrated an anteverted uterus. Patient's legs were in the high lithotomy position. A weighted speculum was placed in the posterior vaginal vault. A single-tooth tenaculum was placed on the anterior cervical lip. The uterus sounded to 9 cm. Pitocin was infiltrated to the cervical vaginal stroma for proximally 15 cc for hemostasis during the resectoscopic myomectomy. Pratt dilator were used to dilate the cervical canal to a 23 mm size. The Hologic Myosure XL  resectoscopic morcellator with a scope of 6.25 mm and an operating blade of 3.0 mm was introduced into the intrauterine cavity. 0.9% normal saline was the distending media. Inspection of the endometrial cavity demonstrated a large submucous myoma attached to the anterior uterine wall at approximately the 1:00 position. Submucous myoma was vascular incorporating 3 forceps of the  endometrial cavity. The submucous myoma was resected up to about 75% and the case had to be stopped because fluid deficit concerns although there were no complications. Tubal ostia were identified no endocervical canal abnormalities.Fluid deficit was approximately 400 cc.  The single-tooth tenaculum was removed patient was extubated transferred to recovery room stable vital signs blood loss was minimal. She received 30 mg of Toradol in route to the recovery room.   ESTIMATED BLOOD LOSS: Minimal    BLOOD ADMINISTERED:none   LOCAL MEDICATIONS USED:  OTHER Pitocin and infiltrating into the cervical vaginal stroma 15 cc  SPECIMEN:  Source of Specimen:  Fragments of submucous myoma  DISPOSITION OF SPECIMEN:  PATHOLOGY  COUNTS:  YES  PLAN OF CARE: Transfer to [place] HMD10:55 AMTD@

## 2017-02-01 ENCOUNTER — Encounter (HOSPITAL_COMMUNITY): Payer: Self-pay | Admitting: Gynecology

## 2017-02-01 NOTE — Anesthesia Postprocedure Evaluation (Signed)
Anesthesia Post Note  Patient: Desiree Krueger  Procedure(s) Performed: Procedure(s) (LRB): DILATATION & CURETTAGE/HYSTEROSCOPY WITH MYOSURE (N/A)     Patient location during evaluation: PACU Anesthesia Type: General Level of consciousness: awake and alert and patient cooperative Pain management: pain level controlled Vital Signs Assessment: post-procedure vital signs reviewed and stable Respiratory status: spontaneous breathing and respiratory function stable Cardiovascular status: stable Anesthetic complications: no    Last Vitals:  Vitals:   01/31/17 1200 01/31/17 1247  BP: 95/61 95/62  Pulse: 66 65  Resp: 14 16  Temp: 36.8 C 36.6 C    Last Pain:  Vitals:   01/31/17 0856  TempSrc: Oral                 Durrell Barajas S

## 2017-02-14 ENCOUNTER — Encounter: Payer: Self-pay | Admitting: Gynecology

## 2017-02-14 ENCOUNTER — Ambulatory Visit: Payer: BLUE CROSS/BLUE SHIELD | Admitting: Gynecology

## 2017-02-14 ENCOUNTER — Ambulatory Visit (INDEPENDENT_AMBULATORY_CARE_PROVIDER_SITE_OTHER): Payer: BLUE CROSS/BLUE SHIELD | Admitting: Gynecology

## 2017-02-14 VITALS — BP 110/78

## 2017-02-14 DIAGNOSIS — Z09 Encounter for follow-up examination after completed treatment for conditions other than malignant neoplasm: Secondary | ICD-10-CM

## 2017-02-14 MED ORDER — MEGESTROL ACETATE 40 MG PO TABS: 40.0000 mg | ORAL_TABLET | Freq: Two times a day (BID) | ORAL | 1 refills | Status: DC

## 2017-02-14 NOTE — Progress Notes (Signed)
   Patient is a 46 year old that presented to the office today for her two-week postop visit. Patient status post resectoscopic myomectomy as a result of submucous myoma contributing to menorrhagia and anemia. Patient is doing well from her surgery pathology report demonstrated fragments of benign myoma.  Findings large submucous myoma incorporating one third of uterine cavity with an attachment at the 1:00 position anterior uterine wall and very vascular. Both tubal ostia were identified and cervical canal was clear.  Due to the size of the myoma 75% of the myoma was resected with the hopes of regression based on decreased blood flow after resection. This was explained to the patient since part of it may have been intramural.  Exam: Abdomen: Soft nontender no rebound or guarding Pelvic: Bartholin urethra Skene was within normal limits Vagina: Menstrual blood present Cervix: No lesions or discharge Uterus: Anteverted normal size shape and consistency Adnexa: No palpable masses or tenderness Rectal exam not done  Assessment/plan: Patient status post resectoscopic myomectomy patient had been on Lupron 11.25 mg IM for 3 months was initially going to be scheduled for a vaginal hysterectomy but her job would not allow her to take so much time. Then her insurance company would not cover for her second round of Lupron so we proceeded with a resectoscopic myomectomy whereby only 3 for severe were removed in the hopes that he will cut down on her bleeding until regressed in size. This was explained to the patient if she continues to have irregular bleeding she may need to proceed with a 2-stage procedure whereby she will need to go back to the operating room and resect the remaining submucous myoma or proceed with a vaginal hysterectomy at that time. To help with some of the current bleeding of called and Megace 40 mg twice a day for 2 weeks. She is scheduled for annual exam September this year.

## 2017-03-16 DIAGNOSIS — R7989 Other specified abnormal findings of blood chemistry: Secondary | ICD-10-CM | POA: Insufficient documentation

## 2017-04-18 ENCOUNTER — Encounter: Payer: BLUE CROSS/BLUE SHIELD | Admitting: Obstetrics & Gynecology

## 2017-04-18 DIAGNOSIS — Z0289 Encounter for other administrative examinations: Secondary | ICD-10-CM

## 2017-05-31 ENCOUNTER — Encounter: Payer: Self-pay | Admitting: Obstetrics & Gynecology

## 2017-05-31 ENCOUNTER — Ambulatory Visit (INDEPENDENT_AMBULATORY_CARE_PROVIDER_SITE_OTHER): Payer: BLUE CROSS/BLUE SHIELD | Admitting: Obstetrics & Gynecology

## 2017-05-31 VITALS — BP 106/70 | Ht 62.0 in | Wt 128.0 lb

## 2017-05-31 DIAGNOSIS — Z01419 Encounter for gynecological examination (general) (routine) without abnormal findings: Secondary | ICD-10-CM | POA: Diagnosis not present

## 2017-05-31 DIAGNOSIS — Z1151 Encounter for screening for human papillomavirus (HPV): Secondary | ICD-10-CM

## 2017-05-31 DIAGNOSIS — Z3009 Encounter for other general counseling and advice on contraception: Secondary | ICD-10-CM

## 2017-05-31 DIAGNOSIS — N945 Secondary dysmenorrhea: Secondary | ICD-10-CM | POA: Diagnosis not present

## 2017-05-31 MED ORDER — NORETHINDRONE 0.35 MG PO TABS: 1.0000 | ORAL_TABLET | Freq: Every day | ORAL | 4 refills | Status: DC

## 2017-05-31 NOTE — Progress Notes (Signed)
Oliviana Mcgahee Jun 13, 1971 [id number]   History:    46 y.o. G4P2A2 Single  RP:  Established patient presenting for annual gyn exam   HPI:  Had [place] Resection of SM Myoma with Dr Toney Rakes  01/31/2017.  Patho benign.  Since then, menses are improved but still with Dysmenorrhea and flow rather heavy x 3 days out of 6 every month.  No pelvic pain otherwise.  Breasts wnl.    Past medical history,surgical history, family history and social history were all reviewed and documented in the EPIC chart.  Gynecologic History Patient's last menstrual period was 04/24/2017. Contraception: abstinence Last Pap: 2017. Results were: normal per patient Last mammogram: 2018. Results were: normal per patient  Obstetric History OB History  Gravida Para Term Preterm AB Living  4 2     2 2   SAB TAB Ectopic Multiple Live Births  2            # Outcome Date GA Lbr Len/2nd Weight Sex Delivery Anes PTL Lv  4 SAB           3 SAB           2 Para           1 Para                ROS: A ROS was performed and pertinent positives and negatives are included in the history.  GENERAL: No fevers or chills. HEENT: No change in vision, no earache, sore throat or sinus congestion. NECK: No pain or stiffness. CARDIOVASCULAR: No chest pain or pressure. No palpitations. PULMONARY: No shortness of breath, cough or wheeze. GASTROINTESTINAL: No abdominal pain, nausea, vomiting or diarrhea, melena or bright red blood per rectum. GENITOURINARY: No urinary frequency, urgency, hesitancy or dysuria. MUSCULOSKELETAL: No joint or muscle pain, no back pain, no recent trauma. DERMATOLOGIC: No rash, no itching, no lesions. ENDOCRINE: No polyuria, polydipsia, no heat or cold intolerance. No recent change in weight. HEMATOLOGICAL: No anemia or easy bruising or bleeding. NEUROLOGIC: No headache, seizures, numbness, tingling or weakness. PSYCHIATRIC: No depression, no loss of interest in normal activity or change in sleep pattern.     Exam:   BP 106/70   Ht 5\' 2"  (1.575 m)   Wt 128 lb (58.1 kg)   LMP 04/24/2017   BMI 23.41 kg/m   Body mass index is 23.41 kg/m.  General appearance : Well developed well nourished female. No acute distress HEENT: Eyes: no retinal hemorrhage or exudates,  Neck supple, trachea midline, no carotid bruits, no thyroidmegaly Lungs: Clear to auscultation, no rhonchi or wheezes, or rib retractions  Heart: Regular rate and rhythm, no murmurs or gallops Breast:Examined in sitting and supine position were symmetrical in appearance, no palpable masses or tenderness,  no skin retraction, no nipple inversion, no nipple discharge, no skin discoloration, no axillary or supraclavicular lymphadenopathy Abdomen: no palpable masses or tenderness, no rebound or guarding Extremities: no edema or skin discoloration or tenderness  Pelvic: Vulva normal  Bartholin, Urethra, Skene Glands: Within normal limits             Vagina: No gross lesions or discharge  Cervix: No gross lesions or discharge.  Pap/HPV HR done.  Uterus  AV, normal size, shape and consistency, non-tender and mobile  Adnexa  Without masses or tenderness  Anus and perineum  normal    Assessment/Plan:  46 y.o. female for annual exam   1. Encounter for routine gynecological examination with Papanicolaou smear of  cervix Normal gyn exam.  Pap/HPV HR done.  Breasts wnl.  2. Encounter for other general counseling or advice on contraception Abstinence currently.  Condoms as needed.  3. Secondary dysmenorrhea Had [place] Resection of SM Myoma/D+C, patho benign 01/2017.  Improved menorrhagia, but still dysmeno.  Different ways to control the menstrual cycle discussed.  Decision to start on Progestin only pill.  Usage/risks/benefits reviewed.  Counseling on above issues >50% x 10 minutes.  Princess Bruins MD, 4:29 PM 05/31/2017

## 2017-05-31 NOTE — Patient Instructions (Signed)
1. Encounter for routine gynecological examination with Papanicolaou smear of cervix Normal gyn exam.  Pap/HPV HR done.  Breasts wnl.  2. Encounter for other general counseling or advice on contraception Abstinence currently.  Condoms as needed.  3. 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  Improved menorrhagia, but still dysmeno.  Different ways to control the menstrual cycle discussed.  Decision to start on Progestin only pill.  Usage/risks/benefits reviewed.  Sydell Axon, fue un placer conocerla hoy!  Voy a informarla de Financial trader.  Dismenorrea (Dysmenorrhea) Los Stage manager (dismenorrea) se originan en los espasmos musculares del tero (contracciones) durante el perodo menstrual. En algunas mujeres, el malestar es slo [place]. En otras, la dismenorrea puede ser tan intensa que interfiere con las actividades diarias durante algunos das, [employer]. La dismenorrea primaria son los clicos menstruales que duran algunos das al inicio de los perodos o poco despus. En general se inician despus de que la adolescente comienza a tener sus perodos. A medida que la mujer avanza en edad, o tiene un beb, los clicos generalmente disminuyen o desaparecen. La dismenorrea secundaria comienza en pocas posteriores de la vida, dura ms tiempo, y Conservation officer, historic buildings puede ser ms intenso. El dolor puede comenzar antes del perodo y [place] despus. CAUSAS La causa de la dismenorrea generalmente es un problema subyacente, por ejemplo:  El tejido que recubre interiormente al tero crece fuera del tero en otras reas del cuerpo (endometriosis).  The endometrial tissue, which normally lines the uterus, is found in or grows into the muscular walls of the uterus (adenomyosis).  Los vasos sanguneos de la pelvis se congestionan con sangre antes del perodo menstrual (sndrome congestivo plvico).  Crecimiento excesivo de las clulas  (plipos) del revestimiento interior del tero o del cuello uterino.  Cada del tero (prolapso) debido a distensin o flojedad de los ligamentos.  Depresin.  Problemas como infeccin o inflamacin en la vejiga.  Problemas en el intestino, un tumor, o sndrome de colon irritable.  Cncer de los rganos femeninos o en la vejiga.  Un tero muy inclinado.  Una apertura muy Kind[employer] uterino cerrado.  Tumores no cancerosos en el tero (fibromas).  Enfermedad plvica inflamatoria (EPI)  Cicatrices en la pelvis (adherencias) por cirugas previas.  Quiste ovrico  El uso del dispositivo intrauterino (DIU) como mtodo anticonceptivo. FACTORES DE RIESGO Puede tener ms riesgo de dismenorrea si:  Tiene menos de 30 aos.  La pubertad se inici temprano.  Tiene sangrado irregular o abundante.  No ha tenido hijos.  Tiene una historia familiar de este problema.  Es fumadora. SIGNOS Y SNTOMAS  Clicos y dolor punzante en la zona inferior del abdomen.  Dolores de [place].  Dolor de Doctor, hospital.  Nuseas o vmitos.  Diarrea.  Sudoracin o [employer].  Heces blandas.  DIAGNSTICO El diagnstico se basa en la historia personal, los sntomas, el examen fsico, las pruebas diagnsticas o procedimientos. Las pruebas diagnsticas o procedimientos pueden ser:  Anlisis de [place].  Ecografas.  Examen de las capas que cubren el tero (dilatacin y curetaje, D&C).  Examen de la zona interna del abdomen o la pelvis con un laparoscopio.  Radiografas.  Tomografa computada.  Resonancia magntica.  Un examen del interior de la vejiga con un citoscopio.  Examen del interior del intestino o el estmago con un dispositivo especial (colonoscopio o un gastroscopio). TRATAMIENTO El tratamiento depende de la causa de la dismenorrea. El tratamiento puede incluir:  Analgsicos  indicados por su mdico.  Pldoras anticonceptivas o un DIU con progesterona.  Terapia de  reemplazo hormonal.  Antiinflamatorios no esteroides (AINE). Los antiinflamatorios pueden detener la produccin de prostaglandinas.  Ciruga para extirpar las adherencias, endometriosis, quiste de ovarios o fibromas.  Extirpacin del tero (histerectoma).  Inyecciones de progesterona para detener el perodo menstrual.  Corte de los nervios del sacro que van hacia los rganos femeninos (neurectoma presacra).  Corriente elctrica en los nervios sacros (estimulacin de los nervios sacros).  Medicamentos antidepresivos.  Terapia psiquitrica, psicoterapia individual o de grupo.  Actividad fsica y fisioterapia.  Meditacin y yoga.  Acupuntura. [place] slo medicamentos de venta libre o recetados, segn las indicaciones del mdico.  Coloque una almohadilla elctrica o una botella con agua caliente en la zona inferior del abdomen. No duerma con la almohadilla trmica.  Haga ejercicios aerbicos como caminar, nadar, andar en bicicleta, para [employer].  Masajear la zona inferior de la espalda o el abdomen puede ser de [place].  Deje de fumar.  Evite la cafena y el alcohol.  SOLICITE ATENCIN MDICA SI:  El dolor no mejora con los medicamentos recetados.  Tiene dolor durante las [employer].  El dolor aumenta y no puede controlarlo con los medicamentos.  Observa una hemorragia vaginal anormal con el perodo.  Tiene nuseas o vmitos con el perodo menstrual que no puede controlar con medicamentos.  SOLICITE ATENCIN MDICA DE INMEDIATO SI: Se desmaya. Esta informacin no tiene Marine scientist el consejo del mdico. Asegrese de hacerle al mdico cualquier pregunta que tenga. Document Released: 05/11/2005 Document Revised: 04/03/2013 Document Reviewed: 01/17/2013 Elsevier Interactive Patient Education  2017 [employer].

## 2017-06-01 NOTE — Addendum Note (Signed)
Addended by: Thurnell Garbe A on: 06/01/2017 09:47 AM   Modules accepted: Orders

## 2017-06-02 LAB — PAP, TP IMAGING W/ HPV RNA, RFLX HPV TYPE 16,18/45: HPV DNA High Risk: NOT DETECTED

## 2017-07-04 ENCOUNTER — Telehealth: Payer: Self-pay | Admitting: *Deleted

## 2017-07-04 NOTE — Telephone Encounter (Signed)
Pt called and told claudia since starting Camila pill increased breast pain, pt is in 1st pack on 2nd row of active pills. Pt asked if she should change pills? Please advise

## 2017-07-08 NOTE — Telephone Encounter (Signed)
Likely to adjust with time, recommend to continue x 3 packs, then call back if wants to change pill.  This is the lowest dosage, just with Progestin, without Estrogen.Marland KitchenMarland Kitchen

## 2017-07-10 NOTE — Telephone Encounter (Signed)
Patient informed. 

## 2017-07-10 NOTE — Telephone Encounter (Signed)
Spanish speaking patient will route to claudia to relay

## 2017-08-24 DIAGNOSIS — E611 Iron deficiency: Secondary | ICD-10-CM | POA: Insufficient documentation

## 2017-08-28 ENCOUNTER — Telehealth: Payer: Self-pay | Admitting: *Deleted

## 2017-08-28 MED ORDER — IBUPROFEN 800 MG PO TABS: 800.0000 mg | ORAL_TABLET | Freq: Three times a day (TID) | ORAL | 5 refills | Status: DC | PRN

## 2017-08-28 NOTE — Telephone Encounter (Signed)
Claudia informed, pt Rx sent.

## 2017-08-28 NOTE — Telephone Encounter (Signed)
I don't know if she took the maximum dosage and regularly, but I recommend Ibuprofen 800 mg PO every 8 hours starting as soon as the cramps start.  #30, refill x 5.

## 2017-08-28 NOTE — Telephone Encounter (Signed)
Patient called and spoke with Desiree Krueger c/o bad period cramps asked if Rx could be sent to help, OTC no relief. Please advise

## 2017-08-30 ENCOUNTER — Ambulatory Visit: Payer: BLUE CROSS/BLUE SHIELD | Admitting: Obstetrics & Gynecology

## 2017-08-30 ENCOUNTER — Other Ambulatory Visit: Payer: Self-pay | Admitting: Anesthesiology

## 2017-08-30 DIAGNOSIS — N920 Excessive and frequent menstruation with regular cycle: Secondary | ICD-10-CM

## 2017-08-30 DIAGNOSIS — N946 Dysmenorrhea, unspecified: Secondary | ICD-10-CM

## 2017-08-31 ENCOUNTER — Encounter: Payer: Self-pay | Admitting: Obstetrics & Gynecology

## 2017-08-31 ENCOUNTER — Other Ambulatory Visit: Payer: Self-pay | Admitting: Obstetrics & Gynecology

## 2017-08-31 ENCOUNTER — Ambulatory Visit: Payer: BLUE CROSS/BLUE SHIELD | Admitting: Obstetrics & Gynecology

## 2017-08-31 ENCOUNTER — Ambulatory Visit (INDEPENDENT_AMBULATORY_CARE_PROVIDER_SITE_OTHER): Payer: BLUE CROSS/BLUE SHIELD

## 2017-08-31 DIAGNOSIS — D250 Submucous leiomyoma of uterus: Secondary | ICD-10-CM | POA: Diagnosis not present

## 2017-08-31 DIAGNOSIS — N920 Excessive and frequent menstruation with regular cycle: Secondary | ICD-10-CM

## 2017-08-31 DIAGNOSIS — N921 Excessive and frequent menstruation with irregular cycle: Secondary | ICD-10-CM | POA: Diagnosis not present

## 2017-08-31 DIAGNOSIS — N946 Dysmenorrhea, unspecified: Secondary | ICD-10-CM

## 2017-08-31 LAB — CBC
HEMATOCRIT: 25.8 % — AB (ref 35.0–45.0)
HEMOGLOBIN: 8.8 g/dL — AB (ref 11.7–15.5)
MCH: 31.3 pg (ref 27.0–33.0)
MCHC: 34.1 g/dL (ref 32.0–36.0)
MCV: 91.8 fL (ref 80.0–100.0)
MPV: 11.2 fL (ref 7.5–12.5)
Platelets: 209 10*3/uL (ref 140–400)
RBC: 2.81 10*6/uL — AB (ref 3.80–5.10)
RDW: 12.5 % (ref 11.0–15.0)
WBC: 5.3 10*3/uL (ref 3.8–10.8)

## 2017-08-31 MED ORDER — NORETHINDRONE ACETATE 5 MG PO TABS: 5.0000 mg | ORAL_TABLET | Freq: Every day | ORAL | 0 refills | Status: DC

## 2017-08-31 NOTE — Progress Notes (Signed)
    Desiree Krueger July 31, 1971 [id number]        47 y.o.  [id number]   RP:  Dysmenorrhea and recurrent menorrhagia for Pelvic US  HPI:  H/O partial resection of SM Myoma by [facility] with Dr Toney Rakes 01/2017.  Patho benign.  Worsening menorrhagia since last visit 05/2017.  Past medical history,surgical history, problem list, medications, allergies, family history and social history were all reviewed and documented in the EPIC chart.  Directed ROS with pertinent positives and negatives documented in the history of present illness/assessment and plan.  Exam:  There were no vitals filed for this visit. General appearance:  Normal  Pelvic US today: T/V images.  Anteverted uterus measuring 9.18 x 5.80 x 4.98 cm.  Prominent endometrial lining measuring 17.6 mm with a sub-mucosal fibroids measuring 1.7 x 1.4 x 2.0 cm.  Very positive color flow Doppler.  Endometrial fluid present measuring 1.3 x 0.8 cm.  Right ovary with serpentine cystic mass folding on itself measuring 1.4 x 0.9 x 1.1 cm.  Left ovary normal.  No free fluid in the posterior cul-de-sac.  01/31/2017: [facility]/Resection/D+C  Dr Toney Rakes The submucous myoma was resected up to about 75% and the case had to be stopped because fluid deficit concerns although there were no complications. Patho:  Fibroid, myoma - SECRETORY ENDOMETRIUM - FRAGMENTS OF BENIGN SMOOTH MUSCLE - NO HYPERPLASIA OR MALIGNANCY IDENTIFIED   Assessment/Plan:  47 y.o. [id number]   1. Menorrhagia with irregular cycle Recurrent menometrorrhagia probably associated with regrowth of a submucosal myoma.  Started on Aygestin to try to minimize bleeding until surgery.  Recommend iron supplements.  Will check hemoglobin today. - CBC  2. Fibroids, submucosal Submucosal myoma measuring 1.7 x 1.4 x 2.0 cm.  Partial removal of that submucosal myoma was performed in June 2018 by Dr. Toney Rakes.  He had to stop the procedure because of excess fluid deficit.  Patient is not in a  situation financially and job wise to undergo a hysterectomy.  Decision to re-attempt hysteroscopy with excision of the submucosal myoma and D&C.  Surgery, risks and benefits thoroughly discussed with patient.  In particular risk of trauma, especially uterine perforation and risks of infection, risk of hemorrhage risk of blood clots reviewed with patient.  Patient voiced understanding and agreement.  Other orders - norethindrone (AYGESTIN) 5 MG tablet; Take 1 tablet (5 mg total) by mouth daily.                        Patient was counseled as to the risk of surgery to include the following:  1. Infection (prohylactic antibiotics will be administered)  2. DVT/Pulmonary Embolism (prophylactic pneumo compression stockings will be used)  3.Trauma to internal organs requiring additional surgical procedure to repair any injury to internal organs requiring perhaps additional hospitalization days.  4.Hemmorhage requiring transfusion and blood products which carry risks such as anaphylactic reaction, hepatitis and AIDS  Patient had received literature information on the procedure scheduled and all her questions were answered and fully accepts all risk.  Counseling on above issues more than 50% for 25 minutes.  Princess Bruins MD, 3:18 PM 08/31/2017

## 2017-09-03 ENCOUNTER — Encounter: Payer: Self-pay | Admitting: Obstetrics & Gynecology

## 2017-09-03 NOTE — Patient Instructions (Signed)
1. Menorrhagia with irregular cycle Recurrent menometrorrhagia probably associated with regrowth of a submucosal myoma.  Started on Aygestin to try to minimize bleeding until surgery.  Recommend iron supplements.  Will check hemoglobin today. - CBC  2. Fibroids, submucosal Submucosal myoma measuring 1.7 x 1.4 x 2.0 cm.  Partial removal of that submucosal myoma was performed in June 2018 by Dr. Toney Rakes.  He had to stop the procedure because of excess fluid deficit.  Patient is not in a situation financially and job wise to undergo a hysterectomy.  Decision to re-attempt hysteroscopy with excision of the submucosal myoma and D&C.  Surgery, risks and benefits thoroughly discussed with patient.  In particular risk of trauma, especially uterine perforation and risks of infection, risk of hemorrhage risk of blood clots reviewed with patient.  Patient voiced understanding and agreement.  Other orders - norethindrone (AYGESTIN) 5 MG tablet; Take 1 tablet (5 mg total) by mouth daily.  Sydell Axon, fue un placer verle hoy!  Voy a informarle de sus [employer].  Histeroscopa (Hysteroscopy) La histeroscopa es un procedimiento que se [place] para observar el interior de la matriz (tero) Puede indicarse por diferentes motivos, entre ellos:  Para evaluar una hemorragia anormal, un fibroma (tumor benigno, no canceroso), tumores, plipos o tejido cicatrizal Sport and exercise psychologist) y la posibilidad de cncer de tero.  Para detectar bultos (tumores) y otros crecimientos anormales uterinos.  Para buscar las causas por las que una mujer no queda embarazada (infertilidad) causas recurrentes de prdida de embarazo (abortos espontneos) o por la prdida de un dispositivo intrauterino (DIU).  Para realizar un procedimiento de esterilizacin cerrando las trompas de [place] desde adentro del tero. En este procedimiento, se coloca un tubo delgado y luminoso, con una cmara en el extremo (histeroscopio)para observar el  interior del tero. La histeroscopa se realiza inmediatamente despus del perodo menstrual para asegurarse de que no existe embarazo. INFORME A SU MDICO:  Cualquier alergia que tenga.  Todos los [employer] [place], incluyendo vitaminas, hierbas, gotas oftlmicas, cremas y medicamentos de venta libre.  Problemas previos que usted o los UnitedHealth de su familia hayan tenido con el uso de anestsicos.  Enfermedades de [employer].  Cirugas previas.  Padecimientos mdicos.  RIESGOS Y COMPLICACIONES Generalmente es un procedimiento seguro. Sin embargo, Games developer procedimiento, pueden surgir complicaciones. Las complicaciones posibles son:  Perforacin del tero.  Sangrado excesivo.  Infeccin.  Lesin en el cuello del tero.  Lesiones en otros rganos.  Reaccin alrgica a un medicamento.  Inoculacin de lquido en exceso dentro del tero. ANTES DEL PROCEDIMIENTO  Consulte a su mdico si debe cambiar o suspender los medicamentos que toma habitualmente.  No tome aspirina ni anticoagulantes durante la semana previa al procedimiento, o segn le hayan indicado. Pueden ocasionar hemorragias.  Si fuma, no lo haga durante las [employer] al procedimiento.  En algunos casos, el da anterior al procedimiento se coloca un medicamento en el cuello del tero. Este medicamento dilata el cuello y [place] la abertura. Esto facilita la insercin del instrumento en el tero durante el procedimiento.  No debe comer ni beber nada durante al menos 8 horas antes de la [place].  Pdale a alguna persona que la lleve a su casa luego del procedimiento.  PROCEDIMIENTO  Le administrarn un medicamento para relajarse (sedante). Tambin podrn administrarle uno de los siguientes medicamentos: ? Medicamentos que adormecen el rea del cuello uterino (anestesia local). ? Un medicamento para que duerma durante el procedimiento (anestesia genera).  El histeroscopio  se inserta a  travs de la vagina, dentro del tero. La cmara del laparoscopio enva una imagen a una pantalla de televisin que se encuentra en el quirfano. [employer] modo, el mdico tendr Ardelia Mems buena visin del interior del tero.  Durante el Wellsite geologist un lquido o aire en el interior de [place], lo que le permitir al cirujano observar mejor.  En algunas ocasiones, el tejido del interior del tero se legra suavemente. Esas muestras de tejido se envan al laboratorio para ser examinadas.  DESPUS DEL PROCEDIMIENTO  Si le han administrado anestesia general podr sentirse atontada durante algunas horas despus del procedimiento.  Si se utiliza Tour manager, podr volver a su casa tan pronto como est estable y se sienta en condiciones.  Podr sentir clicos leves. Es normal que esto dure un par [employer].  Podr tener una hemorragia, la que puede variar entre una pequea mancha durante algunos das hasta una hemorragia similar a Mining engineer durante 3-7 das. Esto es normal.  Si el resultado de los estudios no estn listos durante la visita, tenga otra entrevista con su mdico para conocerlos.  Esta informacin no tiene Marine scientist el consejo del mdico. Asegrese de hacerle al mdico cualquier pregunta que tenga. Document Released: 08/01/2005 Document Revised: 05/22/2013 Document Reviewed: 02/28/2013 Elsevier Interactive Patient Education  2017 [employer].

## 2017-09-04 ENCOUNTER — Telehealth: Payer: Self-pay

## 2017-09-04 NOTE — Telephone Encounter (Signed)
Sorry about her difficult circumstances.  Offer Depo-Provera 150 mg IM injection every 10-11 weeks as a better way to attempt controlling her heavy bleeding in the meantime.

## 2017-09-04 NOTE — Telephone Encounter (Signed)
Rosemarie Ax spoke with patient last week and relayed surgery date. Original date did not work for patient and I rescheduled it to 09/14/17 at 1:00pm.  Rosemarie Ax had spoke with patient about ins benefits and est surgery preymt amount due by one week prior to surgery.  Patient called today stating that she wants to cancel surgery due to financial reasons. She will call back when ready to schedule. Surgery cancelled.

## 2017-09-04 NOTE — Telephone Encounter (Signed)
Patient called back today and spoke with Rosemarie Ax and wants to schedule on a Friday in Feb. I will work this week on getting her re-scheduled.

## 2017-09-05 ENCOUNTER — Telehealth: Payer: Self-pay

## 2017-09-05 ENCOUNTER — Telehealth: Payer: Self-pay | Admitting: *Deleted

## 2017-09-05 NOTE — Telephone Encounter (Signed)
Rosemarie Ax called this Spanish speaking patient and relayed below. "I rescheduled her surgery for Friday 09/29/17 at 10:00am at [facility]. Check in at 8:00am. I will mail a financial letter and pamphlet.   Another important thing. Dr. Dellis Filbert said Hemoglobin down to 8.8. Dr. Dellis Filbert recommend to start now on Ferrous Sulfate 325 mg 3 times a day. She can buy this over the counter. She "Strongly recommends Feraheme, IV Iron ASAP, prior to surgery. "  Feraheme infusion is IV iron. It is scheduled at [facility]. She will go there and have the IV and leave. We will make arrangements for this if she is ok with this."

## 2017-09-05 NOTE — Telephone Encounter (Signed)
Patient informed. 

## 2017-09-05 NOTE — Telephone Encounter (Signed)
Severe Anemia. Started on Aygestin 5 mg daily. Recommend to start now on FeSO4 325 mg 3 times a day. Strongly recommend Feraheme, IV Iron.Please organize.    Rosemarie Ax would you relay the above to patient she already has Rx for Aygestin 5mg  tablet (was sent last week at [facility]) she can get the iron over the counter ferrous sulfate 325 mg take 3 times daily. Her iron is low at 8.8 should be between 11.7-15.2.   I will let you know once the IV infusion is scheduled. It done at [facility] short stay, takes about 1 hour to have done.

## 2017-09-06 ENCOUNTER — Other Ambulatory Visit: Payer: BLUE CROSS/BLUE SHIELD

## 2017-09-06 ENCOUNTER — Telehealth: Payer: Self-pay | Admitting: *Deleted

## 2017-09-06 ENCOUNTER — Ambulatory Visit: Payer: BLUE CROSS/BLUE SHIELD | Admitting: Obstetrics & Gynecology

## 2017-09-06 ENCOUNTER — Telehealth: Payer: Self-pay

## 2017-09-06 NOTE — Telephone Encounter (Signed)
Desiree Krueger at Short Stay received order I faxed for [facility]fusion. She called and scheduled appt for patient on 09/15/16 at 1:00pm. Protocol will not allow her to schedule the 2nd appt Dr. Marguerita Merles requested because they wait to see how patient does with first infusion before they schedule second one.   Rosemarie Ax will call patient and make her aware of this appointment.

## 2017-09-06 NOTE — Telephone Encounter (Signed)
Per Maryla Morrow at [facility] [phone] does not need PA on outpatient basis.  Diagnosis code D50.9 KW CMA

## 2017-09-07 ENCOUNTER — Other Ambulatory Visit: Payer: BLUE CROSS/BLUE SHIELD

## 2017-09-07 ENCOUNTER — Ambulatory Visit: Payer: BLUE CROSS/BLUE SHIELD | Admitting: Obstetrics & Gynecology

## 2017-09-07 NOTE — Telephone Encounter (Signed)
See Desiree A. Note on 09/06/17 regarding appointment times and date.

## 2017-09-07 NOTE — Telephone Encounter (Signed)
Rosemarie Ax called patient and informed patient as below.  "Let Desiree Krueger know that when Dr. Dellis Filbert filled out the order she put that she wants her to have two infusions 3-8 days apart prior to surgery.   I scheduled the first one on Friday Feb 1st at 1:00pm (That is latest appointment they schedule.) They want her to have the first infusion and make sure she does well with it before they schedule the 2nd one. She goes to [facility] and go right inside front door to admitting. They will take her where she needs to go."

## 2017-09-12 ENCOUNTER — Encounter: Payer: Self-pay | Admitting: Anesthesiology

## 2017-09-14 ENCOUNTER — Ambulatory Visit (HOSPITAL_BASED_OUTPATIENT_CLINIC_OR_DEPARTMENT_OTHER): Admit: 2017-09-14 | Payer: BLUE CROSS/BLUE SHIELD | Admitting: Obstetrics & Gynecology

## 2017-09-14 ENCOUNTER — Other Ambulatory Visit (HOSPITAL_COMMUNITY): Payer: Self-pay

## 2017-09-14 ENCOUNTER — Encounter (HOSPITAL_BASED_OUTPATIENT_CLINIC_OR_DEPARTMENT_OTHER): Payer: Self-pay

## 2017-09-14 SURGERY — DILATATION & CURETTAGE/HYSTEROSCOPY WITH MYOSURE
Anesthesia: General

## 2017-09-15 ENCOUNTER — Ambulatory Visit (HOSPITAL_COMMUNITY)
Admission: RE | Admit: 2017-09-15 | Discharge: 2017-09-15 | Disposition: A | Discharge status: Home / Self Care | Payer: BLUE CROSS/BLUE SHIELD | Source: Ambulatory Visit | Attending: Obstetrics & Gynecology | Admitting: Obstetrics & Gynecology

## 2017-09-15 DIAGNOSIS — D509 Iron deficiency anemia, unspecified: Secondary | ICD-10-CM | POA: Insufficient documentation

## 2017-09-15 MED ORDER — FERUMOXYTOL INJECTION 510 MG/17 ML
510.0000 mg | INTRAVENOUS | Status: DC
  Administered 2017-09-15: 510 mg via INTRAVENOUS
  Filled 2017-09-15: qty 17

## 2017-09-15 NOTE — Discharge Instructions (Signed)
Ferumoxytol injection Qu es este medicamento? El FERUMOXYTOL es un complejo de hierro. El hierro se utiliza para la produccin de glbulos rojos sanos, los cuales transportan el oxgeno y los nutrientes hacia todo el cuerpo. Este medicamento se utiliza para tratar la anemia por falta de hierro a las personas con enfermedad renal crnica. Este medicamento puede ser utilizado para otros usos; si tiene alguna pregunta consulte con su proveedor de atencin mdica o con su farmacutico. MARCAS COMUNES: Feraheme Qu le debo informar a mi profesional de la salud antes de tomar este medicamento? Necesita saber si usted presenta alguno de los siguientes problemas o situaciones: -anemia no provocada por niveles bajos de hierro -niveles altos de hierro en la sangre -examen de imgenes por resonancia magntica (IRM) programado -una reaccin alrgica o inusual al hierro, otros medicamentos, alimentos, colorantes o conservantes -si est embarazada o buscando quedar embarazada -si est amamantando a un beb Cmo debo utilizar este medicamento? Este medicamento se administra mediante inyeccin por va intravenosa. Lo administra un profesional de la salud en un hospital o en un entorno clnico. Hable con su pediatra para informarse acerca del uso de este medicamento en nios. Puede requerir atencin especial. Sobredosis: Pngase en contacto inmediatamente con un centro toxicolgico o una sala de urgencia si usted cree que haya tomado demasiado medicamento. ATENCIN: Este medicamento es solo para usted. No comparta este medicamento con nadie. Qu sucede si me olvido de una dosis? Es importante no olvidar ninguna dosis. Informe a su mdico o a su profesional de la salud si no puede asistir a una cita. Qu puede interactuar con este medicamento? Esta medicina puede interactuar con los siguientes medicamentos: -otros productos de hierro Puede ser que esta lista no menciona todas las posibles interacciones.  Informe a su profesional de la salud de todos los productos a base de hierbas, medicamentos de venta libre o suplementos nutritivos que est tomando. Si usted fuma, consume bebidas alcohlicas o si utiliza drogas ilegales, indqueselo tambin a su profesional de la salud. Algunas sustancias pueden interactuar con su medicamento. A qu debo estar atento al usar este medicamento? Visite a su mdico o a su profesional de la salud de manera regular. Si los sntomas no comienzan a mejorar o si empeoran, consulte con su mdico o con su profesional de la salud. Tal vez necesita realizarse anlisis de sangre mientras recibe este medicamento. Tal vez necesita seguir una dieta especial. Consulte a su mdico. Los alimentos que contienen hierro incluyen: alimentos integrales o con cereales, frutas secas, frijoles o arvejas, vegetales de hoja verde y carne que proviene de rganos (hgado, rin). Qu efectos secundarios puedo tener al utilizar este medicamento? Efectos secundarios que debe informar a su mdico o a su profesional de la salud tan pronto como sea posible: reacciones alrgicas, como erupcin cutnea, picazn o urticarias, e hinchazn de la cara, los labios o la lengua problemas respiratorios cambios en la presin sangunea sensacin de desmayos o aturdimiento, cadas fiebre o escalofros enrojecimiento, sudoracin o sensacin de calor hinchazn de tobillos o pies Efectos secundarios que generalmente no requieren atencin mdica (infrmelos a su mdico o a su profesional de la salud si persisten o si son molestos): diarrea dolor de cabeza nuseas, vmito dolor estomacal Puede ser que esta lista no menciona todos los posibles efectos secundarios. Comunquese a su mdico por asesoramiento mdico sobre los efectos secundarios. Usted puede informar los efectos secundarios a la FDA por telfono al 1-800-FDA-1088. Dnde debo guardar mi medicina? Este medicamento se administra en hospitales   o clnicas y no  necesitar guardarlo en su domicilio. ATENCIN: Este folleto es un resumen. Puede ser que no cubra toda la posible informacin. Si usted tiene preguntas acerca de esta medicina, consulte con su mdico, su farmacutico o su profesional de la salud.  2018 Elsevier/Gold Standard (2015-11-16 00:00:00)  

## 2017-09-18 ENCOUNTER — Encounter: Payer: Self-pay | Admitting: Anesthesiology

## 2017-09-19 ENCOUNTER — Telehealth: Payer: Self-pay | Admitting: *Deleted

## 2017-09-19 NOTE — Telephone Encounter (Signed)
Desiree Krueger spoke with patient (spanish speaking only) pt had iron infusion done 09/15/17 on Saturday she had 3 glasses of wine and woke up Sunday with headache and clogged feeling ears. Still has both headache and clogged ears today, asked if this could be related to iron infusion?  Please advise

## 2017-09-19 NOTE — Telephone Encounter (Signed)
Desiree Krueger will inform pt.

## 2017-09-19 NOTE — Telephone Encounter (Signed)
Probably not related.  Needs evaluation with a Family MD.

## 2017-09-22 ENCOUNTER — Ambulatory Visit (HOSPITAL_COMMUNITY)
Admission: RE | Admit: 2017-09-22 | Discharge: 2017-09-22 | Disposition: A | Discharge status: Home / Self Care | Payer: BLUE CROSS/BLUE SHIELD | Source: Ambulatory Visit | Attending: Obstetrics & Gynecology | Admitting: Obstetrics & Gynecology

## 2017-09-22 DIAGNOSIS — D509 Iron deficiency anemia, unspecified: Secondary | ICD-10-CM | POA: Insufficient documentation

## 2017-09-22 MED ORDER — SODIUM CHLORIDE 0.9 % IV SOLN
510.0000 mg | INTRAVENOUS | Status: AC
  Administered 2017-09-22: 510 mg via INTRAVENOUS
  Filled 2017-09-22: qty 17

## 2017-09-25 ENCOUNTER — Telehealth: Payer: Self-pay

## 2017-09-25 ENCOUNTER — Encounter (HOSPITAL_BASED_OUTPATIENT_CLINIC_OR_DEPARTMENT_OTHER): Payer: Self-pay

## 2017-09-25 ENCOUNTER — Other Ambulatory Visit: Payer: Self-pay

## 2017-09-25 NOTE — Progress Notes (Signed)
Spoke with: Braylei NPO:  After Midnight, no gum, candy, or mints   Arrival time: 8:00AM Labs:  CBC, Urine Preg, T&S (patient was unable to get off work to come to do labs earlier) AM medications:  None Pre op orders: Yes Ride home: Thom Chimes (son) [phone]

## 2017-09-25 NOTE — Telephone Encounter (Signed)
Scheduled for D&C, Hysteroscopy w Myosure resection of polyp.   Patient wants to know if any problem if she is on her period day of surgery?

## 2017-09-25 NOTE — Telephone Encounter (Signed)
If light to moderate flow, no problem.  If very heavy flow, better to reschedule a week later.

## 2017-09-25 NOTE — Telephone Encounter (Signed)
Claudia informed patient.  °

## 2017-09-27 ENCOUNTER — Other Ambulatory Visit: Payer: Self-pay | Admitting: Obstetrics & Gynecology

## 2017-09-29 ENCOUNTER — Encounter (HOSPITAL_BASED_OUTPATIENT_CLINIC_OR_DEPARTMENT_OTHER): Payer: Self-pay | Admitting: *Deleted

## 2017-09-29 ENCOUNTER — Ambulatory Visit (HOSPITAL_BASED_OUTPATIENT_CLINIC_OR_DEPARTMENT_OTHER): Payer: BLUE CROSS/BLUE SHIELD | Admitting: Anesthesiology

## 2017-09-29 ENCOUNTER — Encounter (HOSPITAL_BASED_OUTPATIENT_CLINIC_OR_DEPARTMENT_OTHER)
Admission: RE | Disposition: A | Discharge status: Home / Self Care | Payer: Self-pay | Source: Ambulatory Visit | Attending: Obstetrics & Gynecology

## 2017-09-29 ENCOUNTER — Other Ambulatory Visit: Payer: Self-pay

## 2017-09-29 ENCOUNTER — Ambulatory Visit (HOSPITAL_BASED_OUTPATIENT_CLINIC_OR_DEPARTMENT_OTHER)
Admission: RE | Admit: 2017-09-29 | Discharge: 2017-09-29 | Disposition: A | Discharge status: Home / Self Care | Payer: BLUE CROSS/BLUE SHIELD | Source: Ambulatory Visit | Attending: Obstetrics & Gynecology | Admitting: Obstetrics & Gynecology

## 2017-09-29 DIAGNOSIS — K219 Gastro-esophageal reflux disease without esophagitis: Secondary | ICD-10-CM | POA: Diagnosis not present

## 2017-09-29 DIAGNOSIS — Z79899 Other long term (current) drug therapy: Secondary | ICD-10-CM | POA: Diagnosis not present

## 2017-09-29 DIAGNOSIS — M545 Low back pain: Secondary | ICD-10-CM | POA: Insufficient documentation

## 2017-09-29 DIAGNOSIS — Z87891 Personal history of nicotine dependence: Secondary | ICD-10-CM | POA: Insufficient documentation

## 2017-09-29 DIAGNOSIS — D250 Submucous leiomyoma of uterus: Secondary | ICD-10-CM | POA: Insufficient documentation

## 2017-09-29 DIAGNOSIS — I8393 Asymptomatic varicose veins of bilateral lower extremities: Secondary | ICD-10-CM | POA: Diagnosis not present

## 2017-09-29 DIAGNOSIS — K5900 Constipation, unspecified: Secondary | ICD-10-CM | POA: Diagnosis not present

## 2017-09-29 DIAGNOSIS — E039 Hypothyroidism, unspecified: Secondary | ICD-10-CM | POA: Insufficient documentation

## 2017-09-29 DIAGNOSIS — I959 Hypotension, unspecified: Secondary | ICD-10-CM | POA: Insufficient documentation

## 2017-09-29 DIAGNOSIS — Z800 Family history of malignant neoplasm of digestive organs: Secondary | ICD-10-CM | POA: Insufficient documentation

## 2017-09-29 DIAGNOSIS — N946 Dysmenorrhea, unspecified: Secondary | ICD-10-CM

## 2017-09-29 DIAGNOSIS — G43909 Migraine, unspecified, not intractable, without status migrainosus: Secondary | ICD-10-CM | POA: Insufficient documentation

## 2017-09-29 DIAGNOSIS — I739 Peripheral vascular disease, unspecified: Secondary | ICD-10-CM | POA: Insufficient documentation

## 2017-09-29 DIAGNOSIS — D649 Anemia, unspecified: Secondary | ICD-10-CM | POA: Insufficient documentation

## 2017-09-29 DIAGNOSIS — N921 Excessive and frequent menstruation with irregular cycle: Secondary | ICD-10-CM | POA: Diagnosis not present

## 2017-09-29 DIAGNOSIS — Z7982 Long term (current) use of aspirin: Secondary | ICD-10-CM | POA: Insufficient documentation

## 2017-09-29 DIAGNOSIS — F329 Major depressive disorder, single episode, unspecified: Secondary | ICD-10-CM | POA: Insufficient documentation

## 2017-09-29 HISTORY — DX: Constipation, unspecified: K59.00

## 2017-09-29 HISTORY — PX: HYSTEROSCOPY WITH NOVASURE: SHX5574

## 2017-09-29 HISTORY — DX: Hypotension, unspecified: I95.9

## 2017-09-29 HISTORY — PX: DILATATION & CURETTAGE/HYSTEROSCOPY WITH MYOSURE: SHX6511

## 2017-09-29 LAB — CBC
HCT: 39.2 % (ref 36.0–46.0)
HEMOGLOBIN: 13.3 g/dL (ref 12.0–15.0)
MCH: 32.6 pg (ref 26.0–34.0)
MCHC: 33.9 g/dL (ref 30.0–36.0)
MCV: 96.1 fL (ref 78.0–100.0)
PLATELETS: 215 10*3/uL (ref 150–400)
RBC: 4.08 MIL/uL (ref 3.87–5.11)
RDW: 13.8 % (ref 11.5–15.5)
WBC: 4 10*3/uL (ref 4.0–10.5)

## 2017-09-29 LAB — TYPE AND SCREEN
ABO/RH(D): O POS
Antibody Screen: NEGATIVE

## 2017-09-29 LAB — POCT PREGNANCY, URINE: Preg Test, Ur: NEGATIVE

## 2017-09-29 LAB — ABO/RH: ABO/RH(D): O POS

## 2017-09-29 SURGERY — DILATATION & CURETTAGE/HYSTEROSCOPY WITH MYOSURE
Anesthesia: General | Site: Vagina

## 2017-09-29 MED ORDER — LACTATED RINGERS IV SOLN
INTRAVENOUS | Status: DC
  Administered 2017-09-29: 09:00:00 via INTRAVENOUS
  Filled 2017-09-29: qty 1000

## 2017-09-29 MED ORDER — FENTANYL CITRATE (PF) 100 MCG/2ML IJ SOLN
INTRAMUSCULAR | Status: AC
  Filled 2017-09-29: qty 2

## 2017-09-29 MED ORDER — SODIUM CHLORIDE 0.9 % IR SOLN
Status: DC | PRN
  Administered 2017-09-29 (×2): 3000 mL

## 2017-09-29 MED ORDER — MIDAZOLAM HCL 2 MG/2ML IJ SOLN
INTRAMUSCULAR | Status: AC
  Filled 2017-09-29: qty 2

## 2017-09-29 MED ORDER — LACTATED RINGERS IV SOLN
INTRAVENOUS | Status: DC
  Administered 2017-09-29: 13:00:00 via INTRAVENOUS
  Filled 2017-09-29: qty 1000

## 2017-09-29 MED ORDER — SILVER NITRATE-POT NITRATE 75-25 % EX MISC
CUTANEOUS | Status: DC | PRN
  Administered 2017-09-29: 3

## 2017-09-29 MED ORDER — LIDOCAINE 2% (20 MG/ML) 5 ML SYRINGE
INTRAMUSCULAR | Status: DC | PRN
  Administered 2017-09-29: 60 mg via INTRAVENOUS

## 2017-09-29 MED ORDER — SCOPOLAMINE 1 MG/3DAYS TD PT72
MEDICATED_PATCH | TRANSDERMAL | Status: AC
  Filled 2017-09-29: qty 1

## 2017-09-29 MED ORDER — CEFAZOLIN SODIUM-DEXTROSE 2-4 GM/100ML-% IV SOLN
2.0000 g | INTRAVENOUS | Status: AC
  Administered 2017-09-29: 2 g via INTRAVENOUS
  Filled 2017-09-29: qty 100

## 2017-09-29 MED ORDER — PROMETHAZINE HCL 25 MG/ML IJ SOLN
INTRAMUSCULAR | Status: AC
  Filled 2017-09-29: qty 1

## 2017-09-29 MED ORDER — ONDANSETRON HCL 4 MG/2ML IJ SOLN
INTRAMUSCULAR | Status: DC | PRN
  Administered 2017-09-29: 4 mg via INTRAVENOUS

## 2017-09-29 MED ORDER — PROMETHAZINE HCL 25 MG/ML IJ SOLN
6.2500 mg | INTRAMUSCULAR | Status: DC | PRN
  Administered 2017-09-29: 6.25 mg via INTRAVENOUS
  Filled 2017-09-29: qty 1

## 2017-09-29 MED ORDER — PROPOFOL 10 MG/ML IV BOLUS
INTRAVENOUS | Status: AC
  Filled 2017-09-29: qty 40

## 2017-09-29 MED ORDER — OXYCODONE-ACETAMINOPHEN 7.5-325 MG PO TABS: 1.0000 | ORAL_TABLET | ORAL | 0 refills | Status: DC | PRN

## 2017-09-29 MED ORDER — SODIUM CHLORIDE 0.9 % IJ SOLN
INTRAMUSCULAR | Status: AC
  Filled 2017-09-29: qty 10

## 2017-09-29 MED ORDER — FENTANYL CITRATE (PF) 100 MCG/2ML IJ SOLN
25.0000 ug | INTRAMUSCULAR | Status: DC | PRN
  Administered 2017-09-29 (×2): 25 ug via INTRAVENOUS
  Filled 2017-09-29: qty 1

## 2017-09-29 MED ORDER — KETOROLAC TROMETHAMINE 30 MG/ML IJ SOLN
INTRAMUSCULAR | Status: DC | PRN
  Administered 2017-09-29: 30 mg via INTRAVENOUS

## 2017-09-29 MED ORDER — DEXAMETHASONE SODIUM PHOSPHATE 10 MG/ML IJ SOLN
INTRAMUSCULAR | Status: DC | PRN
  Administered 2017-09-29: 10 mg via INTRAVENOUS

## 2017-09-29 MED ORDER — CEFAZOLIN SODIUM-DEXTROSE 2-4 GM/100ML-% IV SOLN
INTRAVENOUS | Status: AC
  Filled 2017-09-29: qty 100

## 2017-09-29 MED ORDER — PROPOFOL 10 MG/ML IV BOLUS
INTRAVENOUS | Status: DC | PRN
  Administered 2017-09-29: 150 mg via INTRAVENOUS

## 2017-09-29 MED ORDER — FENTANYL CITRATE (PF) 100 MCG/2ML IJ SOLN
INTRAMUSCULAR | Status: DC | PRN
  Administered 2017-09-29: 25 ug via INTRAVENOUS
  Administered 2017-09-29: 50 ug via INTRAVENOUS
  Administered 2017-09-29: 25 ug via INTRAVENOUS

## 2017-09-29 MED ORDER — SCOPOLAMINE 1 MG/3DAYS TD PT72
1.0000 | MEDICATED_PATCH | TRANSDERMAL | Status: DC
  Administered 2017-09-29: 1.5 mg via TRANSDERMAL
  Filled 2017-09-29: qty 1

## 2017-09-29 MED ORDER — CHLOROPROCAINE HCL 1 % IJ SOLN
INTRAMUSCULAR | Status: DC | PRN
  Administered 2017-09-29: 20 mL

## 2017-09-29 MED ORDER — LIDOCAINE 2% (20 MG/ML) 5 ML SYRINGE
INTRAMUSCULAR | Status: AC
  Filled 2017-09-29: qty 5

## 2017-09-29 MED ORDER — MIDAZOLAM HCL 2 MG/2ML IJ SOLN
INTRAMUSCULAR | Status: DC | PRN
  Administered 2017-09-29: 2 mg via INTRAVENOUS

## 2017-09-29 SURGICAL SUPPLY — 17 items
ABLATOR ENDOMETRIAL BIPOLAR (ABLATOR) ×2 IMPLANT
CANISTER SUCT 3000ML PPV (MISCELLANEOUS) ×4 IMPLANT
CATH ROBINSON RED A/P 16FR (CATHETERS) ×2 IMPLANT
GLOVE BIO SURGEON STRL SZ 6.5 (GLOVE) ×6 IMPLANT
GLOVE BIOGEL PI IND STRL 7.0 (GLOVE) ×3 IMPLANT
GLOVE BIOGEL PI INDICATOR 7.0 (GLOVE) ×3
GOWN STRL REUS W/TWL LRG LVL3 (GOWN DISPOSABLE) ×6 IMPLANT
IV NS IRRIG 3000ML ARTHROMATIC (IV SOLUTION) ×4 IMPLANT
MYOSURE XL FIBROID REM (MISCELLANEOUS) ×2
PACK VAGINAL MINOR WOMEN LF (CUSTOM PROCEDURE TRAY) ×2 IMPLANT
PAD OB MATERNITY 4.3X12.25 (PERSONAL CARE ITEMS) ×2 IMPLANT
PAD PREP 24X48 CUFFED NSTRL (MISCELLANEOUS) ×2 IMPLANT
SEAL ROD LENS SCOPE MYOSURE (ABLATOR) ×2 IMPLANT
SYSTEM TISS REMOVAL MYSR XL RM (MISCELLANEOUS) ×1 IMPLANT
TOWEL OR 17X24 6PK STRL BLUE (TOWEL DISPOSABLE) ×2 IMPLANT
TUBING AQUILEX INFLOW (TUBING) ×2 IMPLANT
TUBING AQUILEX OUTFLOW (TUBING) ×2 IMPLANT

## 2017-09-29 NOTE — Transfer of Care (Signed)
Immediate Anesthesia Transfer of Care Note  Patient: Desiree Krueger  Procedure(s) Performed: Procedure(s) (LRB): DILATATION & CURETTAGE/HYSTEROSCOPY WITH MYOSURE RESECTION OF SUBMUCOSAL MYOMA (N/A) HYSTEROSCOPY WITH NOVASURE ENDOMETRIAL ABLATION (N/A)  Patient Location: PACU  Anesthesia Type: General  Level of Consciousness: awake, oriented, sedated and patient cooperative  Airway & Oxygen Therapy: Patient Spontanous Breathing and Patient connected to face mask oxygen  Post-op Assessment: Report given to PACU RN and Post -op Vital signs reviewed and stable  Post vital signs: Reviewed and stable  Complications: No apparent anesthesia complications  Last Vitals:  Vitals:   09/29/17 0814 09/29/17 1130  BP: (!) 96/57 104/66  Pulse: 76 89  Resp: 18 12  Temp: 37.2 C 36.5 C  SpO2: 100%     Last Pain:  Vitals:   09/29/17 0814  TempSrc: Oral      Patients Stated Pain Goal: 10 (09/29/17 0852)

## 2017-09-29 NOTE — Discharge Instructions (Addendum)
Endometrial Ablation Endometrial ablation is a procedure that destroys the thin inner layer of the lining of the uterus (endometrium). This procedure may be done:  To stop heavy periods.  To stop bleeding that is causing anemia.  To control irregular bleeding.  To treat bleeding caused by small tumors (fibroids) in the endometrium.  This procedure is often an alternative to major surgery, such as removal of the uterus and cervix (hysterectomy). As a result of this procedure:  You may not be able to have children. However, if you are premenopausal (you have not gone through menopause): ? You may still have a small chance of getting pregnant. ? You will need to use a reliable method of birth control after the procedure to prevent pregnancy.  You may stop having a menstrual period, or you may have only a small amount of bleeding during your period. Menstruation may return several years after the procedure.  Tell a health care provider about:  Any allergies you have.  All medicines you are taking, including vitamins, herbs, eye drops, creams, and over-the-counter medicines.  Any problems you or family members have had with the use of anesthetic medicines.  Any blood disorders you have.  Any surgeries you have had.  Any medical conditions you have. What are the risks? Generally, this is a safe procedure. However, problems may occur, including:  A hole (perforation) in the uterus or bowel.  Infection of the uterus, bladder, or vagina.  Bleeding.  Damage to other structures or organs.  An air bubble in the lung (air embolus).  Problems with pregnancy after the procedure.  Failure of the procedure.  Decreased ability to diagnose cancer in the endometrium.  What happens before the procedure?  You will have tests of your endometrium to make sure there are no pre-cancerous cells or cancer cells present.  You may have an ultrasound of the uterus.  You may be given  medicines to thin the endometrium.  Ask your health care provider about: ? Changing or stopping your regular medicines. This is especially important if you take diabetes medicines or blood thinners. ? Taking medicines such as aspirin and ibuprofen. These medicines can thin your blood. Do not take these medicines before your procedure if your doctor tells you not to.  Plan to have someone take you home from the hospital or clinic. What happens during the procedure?  You will lie on an exam table with your feet and legs supported as in a pelvic exam.  To lower your risk of infection: ? Your health care team will wash or sanitize their hands and put on germ-free (sterile) gloves. ? Your genital area will be washed with soap.  An IV tube will be inserted into one of your veins.  You will be given a medicine to help you relax (sedative).  A surgical instrument with a light and camera (resectoscope) will be inserted into your vagina and moved into your uterus. This allows your surgeon to see inside your uterus.  Endometrial tissue will be removed using one of the following methods: ? Radiofrequency. This method uses a radiofrequency-alternating electric current to remove the endometrium. ? Cryotherapy. This method uses extreme cold to freeze the endometrium. ? Heated-free liquid. This method uses a heated saltwater (saline) solution to remove the endometrium. ? Microwave. This method uses high-energy microwaves to heat up the endometrium and remove it. ? Thermal balloon. This method involves inserting a catheter with a balloon tip into the uterus. The balloon tip is  filled with heated fluid to remove the endometrium. The procedure may vary among health care providers and hospitals. What happens after the procedure?  Your blood pressure, heart rate, breathing rate, and blood oxygen level will be monitored until the medicines you were given have worn off.  As tissue healing occurs, you may  notice vaginal bleeding for 4-6 weeks after the procedure. You may also experience: ? Cramps. ? Thin, watery vaginal discharge that is light pink or brown in color. ? A need to urinate more frequently than usual. ? Nausea.  Do not drive for 24 hours if you were given a sedative.  Do not have sex or insert anything into your vagina until your health care provider approves. Summary  Endometrial ablation is done to treat the many causes of heavy menstrual bleeding.  The procedure may be done only after medications have been tried to control the bleeding.  Plan to have someone take you home from the hospital or clinic. This information is not intended to replace advice given to you by your health care provider. Make sure you discuss any questions you have with your health care provider. Document Released: 06/10/2004 Document Revised: 08/18/2016 Document Reviewed: 08/18/2016 Elsevier Interactive Patient Education  2017 [place] endometrio (Endometrial Ablation) En la ablacin del endometrio se extirpa el recubrimiento interno del tero (endometrio). Este es generalmente un tratamiento que se Scientist, water quality, en forma ambulatoria. La ablacin permite evitar una ciruga mayor, como la ciruga para extirpar el cuello del tero y Nurse, learning disability (histerectoma). Luego de la ablacin del endometrio, tendr poco o nada de flujo menstrual y no podr tener hijos. Sin embargo, si se encuentra en la etapa de la premenopausia, deber usar un mtodo confiable de control de la natalidad luego del procedimiento, ya que hay una pequea probabilidad de que ocurra un Media planner. Hay diferentes razones para llevar a cabo este procedimiento, ellas son:  Perodos abundantes.  Sangrado que causa anemia.  Sangrado irregular.  Fibromas que sangran en la superficie interior del tero, si no miden ms de 3 cm. Tal vez no sea posible realizarle este procedimiento si:  Desea tener hijos en el  futuro.  Tiene clicos intensos durante el perodo menstrual.  Tiene clulas precancerosas o cancerosas en el tero.  Tuvo un embarazo reciente.  Est cursando la menopausia.  Se someti a [place] mayor de tero, la cual le produjo el afinamiento de la pared [place]. Las cirugas pueden incluir lo siguiente: ? La extirpacin de uno o ms fibromas uterinos (miomectoma). ? Ardelia Mems cesrea con una incisin clsica (vertical) en el tero. Pregntele al mdico qu tipo de Pensions consultant. En ocasiones, la Arts development officer piel es diferente de la que se forma en el tero. Aunque se haya sometido a una ciruga uterina, algunos tipos de ablacin an pueden ser seguros para su caso. Hable con el mdico. INFORME A SU MDICO:  Cualquier alergia que tenga.  Todos los [employer] [place], incluyendo vitaminas, hierbas, gotas oftlmicas, cremas y medicamentos de venta libre.  Problemas previos que usted o los [employer] de su familia hayan tenido con el uso de anestsicos.  Enfermedades de [employer].  Cirugas previas.  Padecimientos mdicos.  RIESGOS Y COMPLICACIONES Generalmente, este es un procedimiento seguro. Sin embargo, Games developer procedimiento, pueden surgir complicaciones. Las complicaciones posibles son:  Perforacin del tero.  Hemorragias.  Infeccin en el tero, vejiga, o vagina.  Lesin en los rganos circundantes.  Ardelia Mems  burbuja de aire en un pulmn (embolia de aire).  Embarazo luego del procedimiento.  Si el procedimiento no resuelve el problema, ser necesaria una histerectoma.  Disminucin de la capacidad para Community education officer en el recubrimiento interno del tero. ANTES DEL PROCEDIMIENTO  Deber controlarse la membrana que recubre el tero para asegurarse de que no hay clulas cancerosas o precancerosas.  Le harn una ecografa para observar el tamao del tero y verificar si hay anormalidades.  Le darn medicamentos para Engineer, structural  de la membrana que recubre el tero.  PROCEDIMIENTO Durante el procedimiento, el medico usar un instrumento llamado resectoscopio para ver el interior del tero. Hay diferentes formas de extirpar la membrana que cubre el tero.  Radiofrecuencia: en este mtodo se utiliza un aparato de radiofrecuencia, corriente elctrica alterna, para extirpar la membrana interna del tero.  Crioterapia: en este mtodo se [place] fro extremo para congelar la membrana del tero.  Lquido caliente: en este mtodo se utiliza solucin salina con el mismo objetivo.  Microondas: Se utilizan microondas de alta energa para calentar la membrana y extirparla.  Baln termal: consiste en insertar un catter con un baln en la punta dentro del tero. La punta del baln contiene un lquido caliente que elimina la membrana que tapiza el [place]. DESPUS DEL PROCEDIMIENTO Despus del procedimiento, no tenga relaciones sexuales ni inserte nada en la vagina hasta que su mdico la autorice. Despus del procedimiento podr experimentar:  Clicos.  Flujo vaginal.  Ganas de orinar con frecuencia. Esta informacin no tiene Marine scientist el consejo del mdico. Asegrese de hacerle al mdico cualquier pregunta que tenga. Document Released: 04/03/2013 Document Revised: 04/22/2015 Document Reviewed: 01/02/2013 Elsevier Interactive Patient Education  2017 [employer].

## 2017-09-29 NOTE — Anesthesia Procedure Notes (Signed)
Procedure Name: LMA Insertion Date/Time: 09/29/2017 10:34 AM Performed by: Suan Halter, CRNA Pre-anesthesia Checklist: Patient identified, Emergency Drugs available, Suction available and Patient being monitored Patient Re-evaluated:Patient Re-evaluated prior to induction Oxygen Delivery Method: Circle system utilized Preoxygenation: Pre-oxygenation with 100% oxygen Induction Type: IV induction Ventilation: Mask ventilation without difficulty LMA: LMA inserted LMA Size: 4.0 Number of attempts: 1 Airway Equipment and Method: Bite block Placement Confirmation: positive ETCO2 Tube secured with: Tape Dental Injury: Teeth and Oropharynx as per pre-operative assessment

## 2017-09-29 NOTE — Op Note (Signed)
Operative Note  09/29/2017  11:27 AM  PATIENT:  Desiree Krueger  47 y.o. female  PRE-OPERATIVE DIAGNOSIS:  Dysmenorrhea, menorrhagia, submucosal fibroid  POST-OPERATIVE DIAGNOSIS:  Dysmenorrhea, menorrhagia, submucosal fibroid  PROCEDURE:  Procedure(s): HYSTEROSCOPY WITH MYOSURE COMPLETE RESECTION OF SUBMUCOSAL MYOMA, DILATATION & CURETTAGE, NOVASURE ENDOMETRIAL ABLATION  SURGEON:  Surgeon(s): Princess Bruins, MD  ANESTHESIA:   general  FINDINGS: Completely submucosal myoma attached to the left anterior wall of the uterine cavity, measuring about 3 cm.  DESCRIPTION OF OPERATION: Under general anesthesia with endotracheal intubation, the patient is in lithotomy position.  She is prepped with Betadine on the suprapubic, vulvar and vaginal areas.  The bladder is catheterized.  She is draped as usual.  The vaginal exam reveals a retroverted uterus, mildly increased in volume and no adnexal mass.  The speculum is inserted in the vagina.  The anterior lip of the cervix is grasped with a tenaculum.  A paracervical block is done with Nesacaine 1% a total of 20 cc at 4 and 8:00.  Measurement of the cervix is at 4 cm with hysterometry at 9 cm for a cavity length of 5 cm.  Dilation of the cervix with Pratt dilator up to #27 without difficulty.  The hysteroscope is inserted in the intrauterine cavity and inspection reveals 2 normal ostia with an anterior left completely submucosal myoma measuring about 3 cm in diameter.  Pictures are taken.  We then inserted the XL Myosure in the intrauterine cavity.  Complete excision of the submucosal myoma.  Pictures are taken after excision of the myoma.  Hemostasis is adequate.  The hysteroscope with Myosure are removed.  Systematic curettage of the intrauterine cavity on all surfaces with a sharp curette.  Both resection and curettage material are sent together to pathology.  We then inserted the NovaSure endometrial ablation device in the intrauterine  cavity.  Measurement of the uterine width is at 4.3 cm.  Security test is passed.  We proceed with endometrial ablation with the NovaSure endometrial ablation device for a duration of 1 minutes and 19 seconds at a power of 118.  The NovaSure device is then removed.  The tenaculum is removed from the cervix.  Hemostasis is completed with silver nitrate at that level.  The speculum is removed.  The patient is brought to recovery room in good and stable status.  ESTIMATED BLOOD LOSS: 15 mL   Intake/Output Summary (Last 24 hours) at 09/29/2017 1127 Last data filed at 09/29/2017 1118 Gross per 24 hour  Intake 200 ml  Output 35 ml  Net 165 ml     BLOOD ADMINISTERED:none   LOCAL MEDICATIONS USED:  Nesacaine 1% 20 cc for Paracervical block  SPECIMEN:  Source of Specimen:  Resection specimen of submucosal myoma and endometrial curettings  DISPOSITION OF SPECIMEN:  PATHOLOGY  COUNTS:  YES  PLAN OF CARE: Transfer to PACU  Marie-Lyne LavoieMD11:27 AM

## 2017-09-29 NOTE — Anesthesia Postprocedure Evaluation (Signed)
Anesthesia Post Note  Patient: Desiree Krueger  Procedure(s) Performed: DILATATION & CURETTAGE/HYSTEROSCOPY WITH MYOSURE RESECTION OF SUBMUCOSAL MYOMA (N/A Vagina ) HYSTEROSCOPY WITH NOVASURE ENDOMETRIAL ABLATION (N/A Vagina )     Patient location during evaluation: PACU Anesthesia Type: General Level of consciousness: awake and alert Pain management: pain level controlled Vital Signs Assessment: post-procedure vital signs reviewed and stable Respiratory status: spontaneous breathing, nonlabored ventilation, respiratory function stable and patient connected to nasal cannula oxygen Cardiovascular status: blood pressure returned to baseline and stable Postop Assessment: no apparent nausea or vomiting Anesthetic complications: no    Last Vitals:  Vitals:   09/29/17 1315 09/29/17 1330  BP: 101/65 104/62  Pulse: (!) 58 (!) 59  Resp: 15 17  Temp:    SpO2: 98% 100%    Last Pain:  Vitals:   09/29/17 1300  TempSrc:   PainSc: 7                  Tiajuana Amass

## 2017-09-29 NOTE — H&P (Addendum)
Kayson Bullis is an 47 y.o. female. [id number]   RP:  Dysmenorrhea and recurrent menorrhagia with a SM Myoma for [facility] Myosure resection/D+C/Novasure Endometrial Ablation  HPI:  H/O partial resection of SM Myoma by [facility] with Dr Toney Rakes 01/2017.  Patho benign.  Worsening menorrhagia since last visit 05/2017.  Per Pelvic US 08/31/2017, SM Myoma present.  Feraheme received.  Pertinent Gynecological History: Menses: Menometrorrhagia Contraception: Abstinence/ condoms Blood transfusions: none Sexually transmitted diseases: no past history Last mammogram: normal  Last pap: normal  OB History: G4 P2 A2   Menstrual History:  Patient's last menstrual period was 09/18/2017 (exact date).    Past Medical History:  Diagnosis Date  . Anemia   . Constipation   . Depression   . Dysrhythmia   . GERD (gastroesophageal reflux disease)   . Headache    MIGRAINES  . Low blood pressure   . Lower back pain   . Thyroid disease   . Varicose vein of leg    BILATERAL    Past Surgical History:  Procedure Laterality Date  . DILATATION & CURETTAGE/HYSTEROSCOPY WITH MYOSURE N/A 01/31/2017   Procedure: DILATATION & CURETTAGE/HYSTEROSCOPY WITH MYOSURE;  Surgeon: Terrance Mass, MD;  Location: [facility];  Service: Gynecology;  Laterality: N/A;  . TUBAL LIGATION      Family History  Problem Relation Age of Onset  . Cancer Maternal Uncle        COLON  . Hypertension Father     Social History:  reports that she has quit smoking. Her smoking use included cigarettes. She quit after 6.00 years of use. she has never used smokeless tobacco. She reports that she drinks alcohol. She reports that she does not use drugs.  Allergies: No Known Allergies  Medications Prior to Admission  Medication Sig Dispense Refill Last Dose  . aspirin-acetaminophen-caffeine (EXCEDRIN MIGRAINE) 250-250-65 MG tablet Take by mouth every 6 (six) hours as needed for headache.   Taking  . Bisacodyl (DULCOLAX PO) Take by  mouth.     . IRON PO Take 1 tablet by mouth daily.   Taking  . ibuprofen (ADVIL,MOTRIN) 800 MG tablet Take 1 tablet (800 mg total) by mouth every 8 (eight) hours as needed. (Patient not taking: Reported on 09/21/2017) 30 tablet 5 Not Taking at Unknown time  . norethindrone (AYGESTIN) 5 MG tablet TAKE 1 TABLET BY MOUTH EVERY DAY 30 tablet 1   . norethindrone (MICRONOR,CAMILA,ERRIN) 0.35 MG tablet Take 1 tablet (0.35 mg total) by mouth daily. (Patient not taking: Reported on 09/21/2017) 3 Package 4 Not Taking at Unknown time    REVIEW OF SYSTEMS: A ROS was performed and pertinent positives and negatives are included in the history.  GENERAL: No fevers or chills. HEENT: No change in vision, no earache, sore throat or sinus congestion. NECK: No pain or stiffness. CARDIOVASCULAR: No chest pain or pressure. No palpitations. PULMONARY: No shortness of breath, cough or wheeze. GASTROINTESTINAL: No abdominal pain, nausea, vomiting or diarrhea, melena or bright red blood per rectum. GENITOURINARY: No urinary frequency, urgency, hesitancy or dysuria. MUSCULOSKELETAL: No joint or muscle pain, no back pain, no recent trauma. DERMATOLOGIC: No rash, no itching, no lesions. ENDOCRINE: No polyuria, polydipsia, no heat or cold intolerance. No recent change in weight. HEMATOLOGICAL: No anemia or easy bruising or bleeding. NEUROLOGIC: No headache, seizures, numbness, tingling or weakness. PSYCHIATRIC: No depression, no loss of interest in normal activity or change in sleep pattern.     Blood pressure (!) 96/57, pulse 76, temperature 98.9 F (  37.2 C), temperature source Oral, resp. rate 18, height 5\' 2"  (1.575 m), weight 135 lb (61.2 kg), last menstrual period 09/18/2017, SpO2 100 %.  Physical Exam:  See office notes  Pelvic US 08/31/2017: T/V images.  Anteverted uterus measuring 9.18 x 5.80 x 4.98 cm.  Prominent endometrial lining measuring 17.6 mm with a sub-mucosal fibroids measuring 1.7 x 1.4 x 2.0 cm.  Very positive  color flow Doppler.  Endometrial fluid present measuring 1.3 x 0.8 cm.  Right ovary with serpentine cystic mass folding on itself measuring 1.4 x 0.9 x 1.1 cm.  Left ovary normal.  No free fluid in the posterior cul-de-sac.  01/31/2017: [facility]/Resection/D+C  Dr Toney Rakes The submucous myoma was resected up to about 75% and the case had to be stopped because fluid deficit concerns although there were no complications. Patho:  Fibroid, myoma - SECRETORY ENDOMETRIUM - FRAGMENTS OF BENIGN SMOOTH MUSCLE - NO HYPERPLASIA OR MALIGNANCY IDENTIFIED   Assessment/Plan:  47 y.o. [id number]   1. Menorrhagia with irregular cycle Recurrent menometrorrhagia probably associated with regrowth of a submucosal myoma.  Started on Aygestin to try to minimize bleeding until surgery.  Recommend iron supplements.  Will check hemoglobin today. - CBC  2. Fibroids, submucosal Submucosal myoma measuring 1.7 x 1.4 x 2.0 cm.  Partial removal of that submucosal myoma was performed in June 2018 by Dr. Toney Rakes.  He had to stop the procedure because of excess fluid deficit.  Patient is not in a situation financially and job wise to undergo a hysterectomy.  Decision to re-attempt hysteroscopy with excision of the submucosal myoma and D&C and Novasure Endometrial Ablation.  Surgery, risks and benefits thoroughly discussed with patient.  In particular risk of trauma, especially uterine perforation and risks of infection, risk of hemorrhage risk of blood clots reviewed with patient.  Patient voiced understanding and agreement.  Other orders - norethindrone (AYGESTIN) 5 MG tablet; Take 1 tablet (5 mg total) by mouth daily.                        Patient was counseled as to the risk of surgery to include the following:  1. Infection (prohylactic antibiotics will be administered)  2. DVT/Pulmonary Embolism (prophylactic pneumo compression stockings will be used)  3.Trauma to internal organs requiring additional surgical  procedure to repair any injury to internal organs requiring perhaps additional hospitalization days.  4.Hemmorhage requiring transfusion and blood products which carry risks such as anaphylactic reaction, hepatitis and AIDS  Patient had received literature information on the procedure scheduled and all her questions were answered and fully accepts all risk.   Marie-Lyne Sammy Douthitt 09/29/2017, 8:21 AM

## 2017-09-29 NOTE — Anesthesia Preprocedure Evaluation (Signed)
Anesthesia Evaluation  Patient identified by MRN, date of birth, ID band Patient awake    Reviewed: Allergy & Precautions, H&P , NPO status , Patient's Chart, lab work & pertinent test results  Airway Mallampati: II  TM Distance: >3 FB Neck ROM: full    Dental   Pulmonary former smoker,    breath sounds clear to auscultation       Cardiovascular + Peripheral Vascular Disease   Rhythm:regular Rate:Normal     Neuro/Psych  Headaches, PSYCHIATRIC DISORDERS Depression    GI/Hepatic Neg liver ROS, GERD  ,  Endo/Other  Hypothyroidism   Renal/GU negative Renal ROS     Musculoskeletal   Abdominal   Peds  Hematology negative hematology ROS (+)   Anesthesia Other Findings   Reproductive/Obstetrics                             Anesthesia Physical  Anesthesia Plan  ASA: II  Anesthesia Plan: General   Post-op Pain Management:    Induction: Intravenous  PONV Risk Score and Plan: 4 or greater and Ondansetron, Dexamethasone, Propofol, Midazolam and Scopolamine patch - Pre-op  Airway Management Planned: LMA  Additional Equipment:   Intra-op Plan:   Post-operative Plan: Extubation in OR  Informed Consent: I have reviewed the patients History and Physical, chart, labs and discussed the procedure including the risks, benefits and alternatives for the proposed anesthesia with the patient or authorized representative who has indicated his/her understanding and acceptance.   Dental advisory given  Plan Discussed with: CRNA, Anesthesiologist and Surgeon  Anesthesia Plan Comments:         Anesthesia Quick Evaluation

## 2017-10-02 ENCOUNTER — Encounter (HOSPITAL_BASED_OUTPATIENT_CLINIC_OR_DEPARTMENT_OTHER): Payer: Self-pay | Admitting: Obstetrics & Gynecology

## 2017-10-04 ENCOUNTER — Encounter: Payer: Self-pay | Admitting: Obstetrics & Gynecology

## 2017-10-18 ENCOUNTER — Encounter: Payer: Self-pay | Admitting: Obstetrics & Gynecology

## 2017-10-18 ENCOUNTER — Ambulatory Visit (INDEPENDENT_AMBULATORY_CARE_PROVIDER_SITE_OTHER): Payer: BLUE CROSS/BLUE SHIELD | Admitting: Obstetrics & Gynecology

## 2017-10-18 VITALS — BP 118/76

## 2017-10-18 DIAGNOSIS — R350 Frequency of micturition: Secondary | ICD-10-CM | POA: Diagnosis not present

## 2017-10-18 DIAGNOSIS — Z09 Encounter for follow-up examination after completed treatment for conditions other than malignant neoplasm: Secondary | ICD-10-CM

## 2017-10-18 MED ORDER — SULFAMETHOXAZOLE-TRIMETHOPRIM 800-160 MG PO TABS: 1.0000 | ORAL_TABLET | Freq: Two times a day (BID) | ORAL | 0 refills | Status: AC

## 2017-10-18 NOTE — Progress Notes (Signed)
    Desiree Krueger 07/17/1971 [id number]        47 y.o.  [id number]   RP: 3 weeks postop hysteroscopy resection D&C with NovaSure endometrial ablation  HPI: Good postop evolution.  Passed a large blood clot just after surgery, but no bleeding since then.  No pelvic pain.  Feels that her secretions are slightly watery and increased.  Frequent urination with some pain.  No fever.   OB History  Gravida Para Term Preterm AB Living  4 2     2 2   SAB TAB Ectopic Multiple Live Births  2            # Outcome Date GA Lbr Len/2nd Weight Sex Delivery Anes PTL Lv  4 SAB           3 SAB           2 Para           1 Para               Past medical history,surgical history, problem list, medications, allergies, family history and social history were all reviewed and documented in the EPIC chart.   Directed ROS with pertinent positives and negatives documented in the history of present illness/assessment and plan.  Exam:  Vitals:   10/18/17 1440  BP: 118/76   General appearance:  Normal  Abdomen: Normal CVAT negative bilaterally  Gynecologic exam: Vulva: Normal.  Speculum: Cervix and vagina normal.  Normal vaginal secretions.  No blood.  Bimanual exam: Uterus anteverted, normal volume, mobile, nontender.  No adnexal mass felt, nontender.  Patho:  Endometrium, resection, submucosal myoma, endometrial curettings - MYOMETRIAL FRAGMENTS CONSISTENT WITH LEIOMYOMA. - SECRETORY ENDOMETRIUM. - NO HYPERPLASIA OR MALIGNANCY.       U/A: Clear, nitrites negative, white blood cells 6-10, red blood cells 10-20, bacteria moderate.  Urine culture pending.   Assessment/Plan:  47 y.o. [id number]   1. Follow-up examination after gynecological surgery Good postop healing.  Normal gynecologic exam today.  No complication.  Surgical findings reviewed discussed with patient as well as pathology results which were benign.  No menses since surgery.  Will observe the efficiency of the endometrial  ablation going forward.  2. Frequent urination Abnormal urine analysis.  Probable cystitis.  Treat with Bactrim.  Usage reviewed.  Prescription sent to pharmacy.  Pending urine culture. - Urinalysis,Complete w/RFL Culture  Other orders - sulfamethoxazole-trimethoprim (BACTRIM DS,SEPTRA DS) 800-160 MG tablet; Take 1 tablet by mouth 2 (two) times daily for 3 days.  Counseling on above issues >50% x 10 minutes.  Princess Bruins MD, 3:03 PM 10/18/2017

## 2017-10-18 NOTE — Patient Instructions (Signed)
1. Follow-up examination after gynecological surgery Good postop healing.  Normal gynecologic exam today.  No complication.  Surgical findings reviewed discussed with patient as well as pathology results which were benign.  No menses since surgery.  Will observe the efficiency of the endometrial ablation going forward.  2. Frequent urination Abnormal urine analysis.  Probable cystitis.  Treat with Bactrim.  Usage reviewed.  Prescription sent to pharmacy.  Pending urine culture. - Urinalysis,Complete w/RFL Culture  Other orders - sulfamethoxazole-trimethoprim (BACTRIM DS,SEPTRA DS) 800-160 MG tablet; Take 1 tablet by mouth 2 (two) times daily for 3 days.  Gertrude, muy feliz que esta sanando bien!  Voy a informarle de sus [employer].

## 2017-10-20 LAB — URINALYSIS, COMPLETE W/RFL CULTURE
BILIRUBIN URINE: NEGATIVE
GLUCOSE, UA: NEGATIVE
Hyaline Cast: NONE SEEN /LPF
KETONES UR: NEGATIVE
Nitrites, Initial: NEGATIVE
Protein, ur: NEGATIVE
Specific Gravity, Urine: 1.025 (ref 1.001–1.03)
pH: 5.5 (ref 5.0–8.0)

## 2017-10-20 LAB — URINE CULTURE
MICRO NUMBER: 90293581
SPECIMEN QUALITY:: ADEQUATE

## 2017-10-20 LAB — CULTURE INDICATED

## 2017-10-20 MED ORDER — AMOXICILLIN 500 MG PO TABS: 500.0000 mg | ORAL_TABLET | Freq: Two times a day (BID) | ORAL | 0 refills | Status: AC

## 2017-10-20 NOTE — Addendum Note (Signed)
Addended by: Princess Bruins on: 10/20/2017 04:02 PM   Modules accepted: Orders

## 2018-01-05 ENCOUNTER — Ambulatory Visit: Payer: BLUE CROSS/BLUE SHIELD | Admitting: Family Medicine

## 2018-01-05 ENCOUNTER — Encounter: Payer: Self-pay | Admitting: Family Medicine

## 2018-01-05 ENCOUNTER — Other Ambulatory Visit: Payer: Self-pay

## 2018-01-05 VITALS — BP 110/84 | HR 97 | Temp 98.9°F | Ht 62.4 in | Wt 128.0 lb

## 2018-01-05 DIAGNOSIS — R1013 Epigastric pain: Secondary | ICD-10-CM

## 2018-01-05 DIAGNOSIS — K5900 Constipation, unspecified: Secondary | ICD-10-CM | POA: Diagnosis not present

## 2018-01-05 DIAGNOSIS — R112 Nausea with vomiting, unspecified: Secondary | ICD-10-CM

## 2018-01-05 DIAGNOSIS — R140 Abdominal distension (gaseous): Secondary | ICD-10-CM | POA: Diagnosis not present

## 2018-01-05 LAB — POCT URINALYSIS DIP (MANUAL ENTRY)
Bilirubin, UA: NEGATIVE
Glucose, UA: NEGATIVE mg/dL
Ketones, POC UA: NEGATIVE mg/dL
Leukocytes, UA: NEGATIVE
Nitrite, UA: NEGATIVE
Protein Ur, POC: NEGATIVE mg/dL
Spec Grav, UA: 1.02 (ref 1.010–1.025)
Urobilinogen, UA: 0.2 E.U./dL
pH, UA: 7 (ref 5.0–8.0)

## 2018-01-05 MED ORDER — OMEPRAZOLE 20 MG PO CPDR: 20.0000 mg | DELAYED_RELEASE_CAPSULE | Freq: Two times a day (BID) | ORAL | 2 refills | Status: DC

## 2018-01-05 NOTE — Patient Instructions (Addendum)
     IF you received an x-ray today, you will receive an invoice from [facility]. Please contact [facility] at [phone] with questions or concerns regarding your invoice.   IF you received labwork today, you will receive an invoice from [facility]. Please contact [facility] at [phone] with questions or concerns regarding your invoice.   Our billing staff will not be able to assist you with questions regarding bills from these companies.  You will be contacted with the lab results as soon as they are available. The fastest way to get your results is to activate your My Chart account. Instructions are located on the last page of this paperwork. If you have not heard from [place] regarding the results in 2 weeks, please contact this office.     Opciones de alimentos para pacientes con reflujo gastroesofgico - Adultos (Food Choices for Gastroesophageal Reflux Disease, Adult) Cuando se tiene reflujo gastroesofgico (ERGE), los alimentos que se ingieren y los hbitos de alimentacin son muy importantes. Elegir los alimentos adecuados puede ayudar a [employer]. [place]?  Elija las frutas, los vegetales, los cereales integrales y los productos lcteos con bajo contenido de [place].  [place], de pescado y de ave con bajo contenido de grasas.  Limite las grasas, como los [place], los aderezos para [place], la [place], los frutos secos y Publishing copy.  Lleve un registro de alimentos. Esto ayuda a identificar los alimentos que ocasionan sntomas.  Evite los alimentos que le ocasionen sntomas. Pueden ser distintos para cada persona.  Haga comidas pequeas durante Psychiatrist de 3 comidas abundantes.  Coma lentamente, en un lugar donde est distendido.  Limite el consumo de alimentos fritos.  Cocine los alimentos utilizando mtodos que no sean la fritura.  Evite el consumo alcohol.  Evite beber grandes cantidades de lquidos  con las comidas.  Evite agacharse o recostarse hasta despus de 2 o 3horas de haber comido.  QU ALIMENTOS NO SE RECOMIENDAN? Estos son algunos alimentos y bebidas que pueden empeorar los sntomas: Astronomer. Jugo de tomate. Salsa de tomate y espagueti. Ajes. Cebolla y [place]ert[place]. Rbano picante. Frutas Naranjas, pomelos y limn (fruta y [place]). Carnes Carnes de [place], de pescado y de ave con gran contenido de grasas. Esto incluye los perros calientes, las [place], el [place], la salchicha, el salame y el tocino. Lcteos Leche entera y [place]. [employer]. Crema. [place]. Helados. Queso crema. Bebidas T o caf. Bebidas gaseosas o bebidas energizantes. Condimentos Salsa picante. Salsa barbacoa. Dulces/postres Chocolate y cacao. Rosquillas. Menta y mentol. Grasas y [employer]. Esto incluye las papas fritas. Otros Vinagre. Especias picantes. Esto incluye la pimienta negra, la pimienta blanca, la pimienta roja, la pimienta de cayena, el curry en polvo, los clavos de [place], el jengibre y el [place] en polvo. Esta no es [employer] de los alimentos y las bebidas que se Higher education careers adviser. Comunquese con el nutricionista para recibir ms informacin. Esta informacin no tiene Marine scientist el consejo del mdico. Asegrese de hacerle al mdico cualquier pregunta que tenga. Document Released: 01/31/2012 Document Revised: 08/22/2014 Document Reviewed: 06/05/2013 Elsevier Interactive Patient Education  2017 [employer].

## 2018-01-05 NOTE — Progress Notes (Signed)
5/24/20195:02 PM  Desiree Krueger July 19, 1971, 47 y.o. female [id number]  Chief Complaint  Patient presents with  . Abdominal Pain    during her birthday celebration in [place], had liquor by the name of "Agurdiente" since then has been having stomach pain and vomiting. Has been nauseous all wk, when she eats stomach bloats    HPI:   Patient is a 47 y.o. female who presents today for generalized abd pain but so more in the epigastric area, bloating, nausea and vomiting that started after heavy drinking for her birthday  She has been able to have gatorade and crackers Denies any blood or coffee ground emesis She reports episodes of similar (minus vomiting) in the past whenever she drinks coffee, dark sodas, lemons, etc She reports increased belching and a very sour taste in her mouth She reports long standing constipation, takes ducolax or laxative teas, but they seem to be working less She had a BM yesterday and today, smallish She is passing gas She denies any fever or chills    Fall Risk  01/05/2018 12/25/2014  Falls in the past year? No No     Depression screen [facility] 2/9 01/05/2018 12/25/2014  Decreased Interest 0 0  Down, Depressed, Hopeless 0 0  PHQ - 2 Score 0 0    No Known Allergies  Prior to Admission medications   Not on File    Past Medical History:  Diagnosis Date  . Anemia   . Constipation   . Depression   . Dysrhythmia   . GERD (gastroesophageal reflux disease)   . Headache    MIGRAINES  . Low blood pressure   . Lower back pain   . Thyroid disease   . Varicose vein of leg    BILATERAL    Past Surgical History:  Procedure Laterality Date  . DILATATION & CURETTAGE/HYSTEROSCOPY WITH MYOSURE N/A 01/31/2017   Procedure: DILATATION & CURETTAGE/HYSTEROSCOPY WITH MYOSURE;  Surgeon: Terrance Mass, MD;  Location: [facility];  Service: Gynecology;  Laterality: N/A;  . DILATATION & CURETTAGE/HYSTEROSCOPY WITH MYOSURE N/A 09/29/2017   Procedure:  DILATATION & CURETTAGE/HYSTEROSCOPY WITH MYOSURE RESECTION OF SUBMUCOSAL MYOMA;  Surgeon: Princess Bruins, MD;  Location: [facility];  Service: Gynecology;  Laterality: N/A;  request to follow 2nd case around 10am in [place] block time Requests one hour OR time  . HYSTEROSCOPY WITH NOVASURE N/A 09/29/2017   Procedure: HYSTEROSCOPY WITH NOVASURE ENDOMETRIAL ABLATION;  Surgeon: Princess Bruins, MD;  Location: [facility];  Service: Gynecology;  Laterality: N/A;  . TUBAL LIGATION      Social History   Tobacco Use  . Smoking status: Former Smoker    Years: 6.00    Types: Cigarettes  . Smokeless tobacco: Never Used  Substance Use Topics  . Alcohol use: Yes    Alcohol/week: 0.0 oz    Comment: OCC    Family History  Problem Relation Age of Onset  . Cancer Maternal Uncle        COLON  . Hypertension Father     Review of Systems  Constitutional: Negative for chills, fever and weight loss.  Respiratory: Negative for cough and shortness of breath.   Cardiovascular: Negative for chest pain, palpitations and leg swelling.  Gastrointestinal: Positive for abdominal pain, constipation, nausea and vomiting. Negative for blood in stool, diarrhea and melena.  Genitourinary: Negative for dysuria.  All other systems reviewed and are negative.  Per hpi  OBJECTIVE:  Blood pressure 110/84, pulse 97, temperature 98.9  F (37.2 C), temperature source Oral, height 5' 2.4" (1.585 m), weight 128 lb (58.1 kg), last menstrual period 09/18/2017, SpO2 98 %.  Physical Exam  Constitutional: She is oriented to person, place, and time. She appears well-developed and well-nourished. She does not appear ill.  HENT:  Head: Normocephalic and atraumatic.  Mouth/Throat: Oropharynx is clear and moist. No oropharyngeal exudate.  Eyes: Pupils are equal, round, and reactive to light. EOM are normal. No scleral icterus.  Neck: Neck supple.  Cardiovascular: Normal rate,  regular rhythm and normal heart sounds. Exam reveals no gallop and no friction rub.  No murmur heard. Pulmonary/Chest: Effort normal and breath sounds normal. She has no wheezes. She has no rales.  Abdominal: Soft. Bowel sounds are normal. She exhibits distension. There is no hepatosplenomegaly. There is generalized tenderness and tenderness in the right upper quadrant.  Musculoskeletal: She exhibits no edema.  Neurological: She is alert and oriented to person, place, and time.  Skin: Skin is warm and dry.  Psychiatric: She has a normal mood and affect.  Nursing note and vitals reviewed.   ASSESSMENT and PLAN  1. Epigastric pain Discussed sx mostly suggestive of gastritis 2/2 alcohol. Lipase to r/o pancreatitis and h pylori test given recent travel. Supportive measures and RTC precautions reviewed.  - POCT urinalysis dipstick - CBC - Comprehensive metabolic panel - Lipase - H. pylori breath test - Ambulatory referral to Gastroenterology  2. Bloating  3. Nausea and vomiting, intractability of vomiting not specified, unspecified vomiting type  4. Constipation, unspecified constipation type Discussed OTC use of mag oxide 200-400mg  daily.  Other orders - omeprazole (PRILOSEC) 20 MG capsule; Take 1 capsule (20 mg total) by mouth 2 (two) times daily before a meal.  Return for despues de GI.    Rutherford Guys, MD Primary [facility] at [facility] [place], [place] [address] Ph.  [phone] Fax [phone]

## 2018-01-06 LAB — COMPREHENSIVE METABOLIC PANEL
ALT: 30 IU/L (ref 0–32)
AST: 24 IU/L (ref 0–40)
Albumin/Globulin Ratio: 1.7 (ref 1.2–2.2)
Albumin: 4.5 g/dL (ref 3.5–5.5)
Alkaline Phosphatase: 68 IU/L (ref 39–117)
BUN/Creatinine Ratio: 11 (ref 9–23)
BUN: 9 mg/dL (ref 6–24)
Bilirubin Total: 0.4 mg/dL (ref 0.0–1.2)
CO2: 27 mmol/L (ref 20–29)
Calcium: 9.5 mg/dL (ref 8.7–10.2)
Chloride: 100 mmol/L (ref 96–106)
Creatinine, Ser: 0.8 mg/dL (ref 0.57–1.00)
GFR calc Af Amer: 102 mL/min/{1.73_m2} (ref >59)
GFR calc non Af Amer: 88 mL/min/{1.73_m2} (ref >59)
Globulin, Total: 2.6 g/dL (ref 1.5–4.5)
Glucose: 94 mg/dL (ref 65–99)
Potassium: 4.3 mmol/L (ref 3.5–5.2)
Sodium: 139 mmol/L (ref 134–144)
Total Protein: 7.1 g/dL (ref 6.0–8.5)

## 2018-01-06 LAB — CBC
Hematocrit: 40.8 % (ref 34.0–46.6)
Hemoglobin: 13.5 g/dL (ref 11.1–15.9)
MCH: 31.5 pg (ref 26.6–33.0)
MCHC: 33.1 g/dL (ref 31.5–35.7)
MCV: 95 fL (ref 79–97)
Platelets: 218 10*3/uL (ref 150–450)
RBC: 4.28 x10E6/uL (ref 3.77–5.28)
RDW: 13.4 % (ref 12.3–15.4)
WBC: 5.9 10*3/uL (ref 3.4–10.8)

## 2018-01-06 LAB — LIPASE: Lipase: 41 U/L (ref 14–72)

## 2018-01-07 LAB — H. PYLORI BREATH TEST: H pylori Breath Test: NEGATIVE

## 2018-01-07 LAB — H. PYLORI BREATH COLLECTION

## 2018-01-16 ENCOUNTER — Telehealth: Payer: Self-pay | Admitting: Family Medicine

## 2018-01-16 NOTE — Telephone Encounter (Signed)
Copied from [place] [id number]. Topic: Quick Communication - See Telephone Encounter >> Jan 16, 2018  4:50 PM Vernona Rieger wrote: CRM for notification. See Telephone encounter for: 01/16/18.  Patient is requesting her labs form 01/05/18. Please call patient back @ [phone]

## 2018-01-17 ENCOUNTER — Encounter: Payer: Self-pay | Admitting: Family Medicine

## 2018-01-17 ENCOUNTER — Encounter: Payer: Self-pay | Admitting: Gastroenterology

## 2018-01-17 DIAGNOSIS — R3121 Asymptomatic microscopic hematuria: Secondary | ICD-10-CM

## 2018-01-17 NOTE — Telephone Encounter (Signed)
Please advise on labs.

## 2018-01-17 NOTE — Telephone Encounter (Signed)
Posted results via my chart. Labs are normal.

## 2018-01-23 ENCOUNTER — Ambulatory Visit: Payer: BLUE CROSS/BLUE SHIELD

## 2018-01-23 DIAGNOSIS — R3121 Asymptomatic microscopic hematuria: Secondary | ICD-10-CM

## 2018-01-24 LAB — MICROSCOPIC EXAMINATION
Bacteria, UA: NONE SEEN
Casts: NONE SEEN /lpf

## 2018-01-24 LAB — URINALYSIS, ROUTINE W REFLEX MICROSCOPIC
Bilirubin, UA: NEGATIVE
Glucose, UA: NEGATIVE
Ketones, UA: NEGATIVE
Nitrite, UA: NEGATIVE
Protein, UA: NEGATIVE
Specific Gravity, UA: 1.011 (ref 1.005–1.030)
Urobilinogen, Ur: 0.2 mg/dL (ref 0.2–1.0)
pH, UA: 5.5 (ref 5.0–7.5)

## 2018-02-01 ENCOUNTER — Encounter: Payer: Self-pay | Admitting: Obstetrics & Gynecology

## 2018-02-01 ENCOUNTER — Ambulatory Visit: Payer: BLUE CROSS/BLUE SHIELD | Admitting: Obstetrics & Gynecology

## 2018-02-01 VITALS — BP 112/70

## 2018-02-01 DIAGNOSIS — N898 Other specified noninflammatory disorders of vagina: Secondary | ICD-10-CM

## 2018-02-01 MED ORDER — TERCONAZOLE 0.8 % VA CREA: 1.0000 | TOPICAL_CREAM | Freq: Every day | VAGINAL | 0 refills | Status: AC

## 2018-02-01 NOTE — Patient Instructions (Signed)
1. Vaginal discharge Status post endometrial ablation in March 2019.  No menstrual periods or vaginal bleeding since then.  Increased vaginal discharge and vaginal and vulvar itching especially when sweating.  Wet prep negative today.  Decision to still send a prescription of Terazol 3 for possible yeast vaginitis not picked up by the wet prep today.  Recommend hydrocortisone cream 1% thin application x1 week on the vulva.  May use tampons to decrease the exposure to vaginal discharge on the vulva.  Probiotic tablet vaginally once a week recommended as needed. - WET PREP FOR [place], YEAST, CLUE  Other orders - terconazole (TERAZOL 3) 0.8 % vaginal cream; Place 1 applicator vaginally at bedtime for 3 days.  Sydell Axon, fue un placer verle hoy!

## 2018-02-01 NOTE — Progress Notes (Signed)
    Desiree Krueger 04/15/1971 [id number]        47 y.o.  [id number]  Single  RP: Vaginal/vulvar itching with yellowish discharge x a few weeks  HPI: Had an Endometrial Ablation 09/29/2017, no menses, no vaginal bleeding since then.  Vaginal/vulvar itching, worse when weather is hot and patient is sweating.  Sees a yellowish discharge on underwear.  No pelvic pain.  Abstinent.  No urinary tract infection symptoms.  Tendency for constipation.  No fever.   OB History  Gravida Para Term Preterm AB Living  4 2     2 2   SAB TAB Ectopic Multiple Live Births  2            # Outcome Date GA Lbr Len/2nd Weight Sex Delivery Anes PTL Lv  4 SAB           3 SAB           2 Para           1 Para             Past medical history,surgical history, problem list, medications, allergies, family history and social history were all reviewed and documented in the EPIC chart.   Directed ROS with pertinent positives and negatives documented in the history of present illness/assessment and plan.  Exam:  Vitals:   02/01/18 1450  BP: 112/70   General appearance:  Normal  Gynecologic exam: Vulva normal.  Speculum: Cervix and vagina normal.  Clear vaginal secretions.  Wet prep done.  Wet prep: Negative   Assessment/Plan:  47 y.o. [id number]   1. Vaginal discharge Status post endometrial ablation in March 2019.  No menstrual periods or vaginal bleeding since then.  Increased vaginal discharge and vaginal and vulvar itching especially when sweating.  Wet prep negative today.  Decision to still send a prescription of Terazol 3 for possible yeast vaginitis not picked up by the wet prep today.  Recommend hydrocortisone cream 1% thin application x1 week on the vulva.  May use tampons to decrease the exposure to vaginal discharge on the vulva.  Probiotic tablet vaginally once a week recommended as needed. - WET PREP FOR [place], YEAST, CLUE  Other orders - terconazole (TERAZOL 3) 0.8 % vaginal cream; Place  1 applicator vaginally at bedtime for 3 days.  Counseling on above issues and coordination of care more than 50% for 15 minutes.  Princess Bruins MD, 2:55 PM 02/01/2018

## 2018-02-02 LAB — WET PREP FOR TRICH, YEAST, CLUE

## 2018-03-19 ENCOUNTER — Encounter: Payer: Self-pay | Admitting: Gastroenterology

## 2018-03-19 ENCOUNTER — Ambulatory Visit (INDEPENDENT_AMBULATORY_CARE_PROVIDER_SITE_OTHER): Payer: BLUE CROSS/BLUE SHIELD | Admitting: Gastroenterology

## 2018-03-19 ENCOUNTER — Encounter

## 2018-03-19 VITALS — BP 88/54 | HR 79 | Ht 62.0 in | Wt 129.0 lb

## 2018-03-19 DIAGNOSIS — R140 Abdominal distension (gaseous): Secondary | ICD-10-CM | POA: Diagnosis not present

## 2018-03-19 DIAGNOSIS — K5909 Other constipation: Secondary | ICD-10-CM | POA: Diagnosis not present

## 2018-03-19 DIAGNOSIS — R1010 Upper abdominal pain, unspecified: Secondary | ICD-10-CM

## 2018-03-19 NOTE — Patient Instructions (Addendum)
If you are age 47 or older, your body mass index should be between 23-30. Your Body mass index is 23.59 kg/m. If this is out of the aforementioned range listed, please consider follow up with your Primary Care Provider.  If you are age 56 or younger, your body mass index should be between 19-25. Your Body mass index is 23.59 kg/m. If this is out of the aformentioned range listed, please consider follow up with your Primary Care Provider.   You have been scheduled for a colonoscopy. Please follow written instructions given to you at your visit today.  Please pick up your prep supplies at the pharmacy within the next 1-3 days. If you use inhalers (even only as needed), please bring them with you on the day of your procedure.  Constipation:  Increase your intake of water, fruits/vegetables and your activity level.  Miralax powder  1 capful in a glass of water or juice once daily.  Can increase to twice daily if needed.  It was a pleasure to see you today!  Dr. Loletha Carrow

## 2018-03-19 NOTE — Progress Notes (Signed)
[facility] Consult Note:  History: Desiree Krueger 03/19/2018  Referring physician: Rocky Morel, MD  Reason for consult/chief complaint: Abdominal Pain (pt reports epigatric pain and bloating; has had some improvement with Omeprazole 20mg )   Subjective  HPI:  This is a very pleasant 47 year old woman referred by primary care for upper abdominal pain with bloating.  She reports at least 10 years of chronic constipation where she might have a BM on average once a week.  She will take occasional magnesium tablets or other laxatives she buys over-the-counter.  For the last several months she has had upper abdominal pain that is burning with bloating and visible postprandial distention with early satiety.  She is recalls that it came on after drinking some liquor at her birthday a few months ago but has persisted since then.  She denies rectal bleeding or vomiting, and has had a few pounds of weight loss. She had a recent gynecologic examination for vaginal discharge and underwent, D&C earlier this year. As needed prilosec seems to be making this upper abdominal pain and bloating better recently, but she is still concerned about why it is occurring and why she needs medicine.    ROS:  Review of Systems  Constitutional: Negative for appetite change and unexpected weight change.  HENT: Negative for mouth sores and voice change.   Eyes: Negative for pain and redness.  Respiratory: Negative for cough and shortness of breath.   Cardiovascular: Negative for chest pain and palpitations.  Genitourinary: Negative for dysuria and hematuria.  Musculoskeletal: Negative for arthralgias and myalgias.  Skin: Negative for pallor and rash.  Neurological: Positive for headaches. Negative for weakness.  Hematological: Negative for adenopathy.     Past Medical History: Past Medical History:  Diagnosis Date  . Anemia   . Constipation   . Depression   .  Dysrhythmia   . GERD (gastroesophageal reflux disease)   . Headache    MIGRAINES  . Low blood pressure   . Lower back pain   . Thyroid disease   . Varicose vein of leg    BILATERAL     Past Surgical History: Past Surgical History:  Procedure Laterality Date  . DILATATION & CURETTAGE/HYSTEROSCOPY WITH MYOSURE N/A 01/31/2017   Procedure: DILATATION & CURETTAGE/HYSTEROSCOPY WITH MYOSURE;  Surgeon: Terrance Mass, MD;  Location: [facility];  Service: Gynecology;  Laterality: N/A;  . DILATATION & CURETTAGE/HYSTEROSCOPY WITH MYOSURE N/A 09/29/2017   Procedure: DILATATION & CURETTAGE/HYSTEROSCOPY WITH MYOSURE RESECTION OF SUBMUCOSAL MYOMA;  Surgeon: Princess Bruins, MD;  Location: [facility];  Service: Gynecology;  Laterality: N/A;  request to follow 2nd case around 10am in [place] block time Requests one hour OR time  . HYSTEROSCOPY WITH NOVASURE N/A 09/29/2017   Procedure: HYSTEROSCOPY WITH NOVASURE ENDOMETRIAL ABLATION;  Surgeon: Princess Bruins, MD;  Location: [facility];  Service: Gynecology;  Laterality: N/A;  . TUBAL LIGATION       Family History: Family History  Problem Relation Age of Onset  . Cancer Maternal Uncle        COLON  . Hypertension Father     Social History: Social History   Socioeconomic History  . Marital status: Single    Spouse name: Not on file  . Number of children: Not on file  . Years of education: Not on file  . Highest education level: Not on file  Occupational History  . Not on file  Social Needs  . Financial resource strain: Not  on file  . Food insecurity:    Worry: Not on file    Inability: Not on file  . Transportation needs:    Medical: Not on file    Non-medical: Not on file  Tobacco Use  . Smoking status: Former Smoker    Years: 6.00    Types: Cigarettes  . Smokeless tobacco: Never Used  Substance and Sexual Activity  . Alcohol use: Yes    Alcohol/week: 0.0 oz    Comment: OCC  . Drug  use: No  . Sexual activity: Yes    Partners: Male    Comment: TUBAL LIGATION   Lifestyle  . Physical activity:    Days per week: Not on file    Minutes per session: Not on file  . Stress: Not on file  Relationships  . Social connections:    Talks on phone: Not on file    Gets together: Not on file    Attends religious service: Not on file    Active member of club or organization: Not on file    Attends meetings of clubs or organizations: Not on file    Relationship status: Not on file  Other Topics Concern  . Not on file  Social History Narrative  . Not on file    Allergies: No Known Allergies  Outpatient Meds: Current Outpatient Medications  Medication Sig Dispense Refill  . bisacodyl (DULCOLAX) 5 MG EC tablet Take 5 mg by mouth daily as needed for moderate constipation.    . Magnesium 70 MG CAPS Take by mouth.    Marland Kitchen omeprazole (PRILOSEC) 20 MG capsule Take 1 capsule (20 mg total) by mouth 2 (two) times daily before a meal. 60 capsule 2   No current facility-administered medications for this visit.       ___________________________________________________________________ Objective   Exam:  BP (!) 88/54   Pulse 79   Ht 5\' 2"  (1.575 m)   Wt 129 lb (58.5 kg)   LMP 09/18/2017 (Exact Date)   BMI 23.59 kg/m    General: this is a(n) well-appearing woman  Eyes: sclera anicteric, no redness  ENT: oral mucosa moist without lesions, no cervical or supraclavicular lymphadenopathy, good dentition  CV: RRR without murmur, S1/S2, no JVD, no peripheral edema  Resp: clear to auscultation bilaterally, normal RR and effort noted  GI: soft, mild upper tenderness, with active bowel sounds. No guarding or palpable organomegaly noted.  No bruit  Skin; warm and dry, no rash or jaundice noted  Neuro: awake, alert and oriented x 3. Normal gross motor function and fluent speech  Labs:  Negative UBT 01/05/18  CBC Latest Ref Rng & Units 01/05/2018 09/29/2017 08/31/2017  WBC  3.4 - 10.8 x10E3/uL 5.9 4.0 5.3  Hemoglobin 11.1 - 15.9 g/dL 13.5 13.3 8.8(L)  Hematocrit 34.0 - 46.6 % 40.8 39.2 25.8(L)  Platelets 150 - 450 x10E3/uL 218 215 209   CMP Latest Ref Rng & Units 01/05/2018 01/26/2017 12/25/2014  Glucose 65 - 99 mg/dL 94 85 84  BUN 6 - 24 mg/dL 9 15 9   Creatinine 0.57 - 1.00 mg/dL 0.80 0.67 0.90  Sodium 134 - 144 mmol/L 139 136 137  Potassium 3.5 - 5.2 mmol/L 4.3 3.7 4.7  Chloride 96 - 106 mmol/L 100 99(L) 102  CO2 20 - 29 mmol/L 27 31 25   Calcium 8.7 - 10.2 mg/dL 9.5 9.4 9.5  Total Protein 6.0 - 8.5 g/dL 7.1 - 7.4  Total Bilirubin 0.0 - 1.2 mg/dL 0.4 - 0.4  Alkaline  Phos 39 - 117 IU/L 68 - 62  AST 0 - 40 IU/L 24 - 25  ALT 0 - 32 IU/L 30 - 25     Radiologic Studies:  None  Assessment: Encounter Diagnoses  Name Primary?  Marland Kitchen Upper abdominal pain Yes  . Abdominal bloating   . Chronic constipation     Long-standing constipation, more recent onset of upper abdominal bloating with early satiety and distention.  Later in the conversation, she seemed to recall that perhaps she had similar symptoms and some testing years ago living in [place]. It sounds most likely to be functional, though unclear why recently triggered.  Less likely obstructive or inflammatory or neoplastic. Plan:  Upper endoscopy and colonoscopy.  She is agreeable after thorough discussion of procedure and risks. Continue as needed use of Prilosec. Daily MiraLAX for relief of constipation.  Thank you for the courtesy of this consult.  Please call me with any questions or concerns.  Nelida Meuse III  CC: Rocky Morel, MD

## 2018-03-20 ENCOUNTER — Other Ambulatory Visit: Payer: Self-pay

## 2018-03-20 DIAGNOSIS — R140 Abdominal distension (gaseous): Secondary | ICD-10-CM

## 2018-03-20 DIAGNOSIS — R1010 Upper abdominal pain, unspecified: Secondary | ICD-10-CM

## 2018-03-20 DIAGNOSIS — K5909 Other constipation: Secondary | ICD-10-CM

## 2018-04-06 ENCOUNTER — Other Ambulatory Visit: Payer: Self-pay | Admitting: Family Medicine

## 2018-04-20 ENCOUNTER — Encounter: Payer: Self-pay | Admitting: Gastroenterology

## 2018-04-25 ENCOUNTER — Other Ambulatory Visit: Payer: Self-pay

## 2018-04-25 ENCOUNTER — Telehealth: Payer: Self-pay | Admitting: Gastroenterology

## 2018-04-25 MED ORDER — NA SULFATE-K SULFATE-MG SULF 17.5-3.13-1.6 GM/177ML PO SOLN: 1.0000 | ORAL | 0 refills | Status: DC

## 2018-04-25 NOTE — Telephone Encounter (Signed)
Sent Rx for Suprep to pharmacy.

## 2018-04-25 NOTE — Telephone Encounter (Signed)
Pt called stating that [facility] did not received prescription for suprep. Proc scheduled on 05/03/18. Pharmacy is located on [address] in [place].

## 2018-05-03 ENCOUNTER — Ambulatory Visit (AMBULATORY_SURGERY_CENTER): Payer: BLUE CROSS/BLUE SHIELD | Admitting: Gastroenterology

## 2018-05-03 ENCOUNTER — Encounter: Payer: Self-pay | Admitting: Gastroenterology

## 2018-05-03 VITALS — BP 97/51 | HR 88 | Temp 98.4°F | Resp 9 | Ht 62.0 in | Wt 129.0 lb

## 2018-05-03 DIAGNOSIS — R140 Abdominal distension (gaseous): Secondary | ICD-10-CM

## 2018-05-03 DIAGNOSIS — K5900 Constipation, unspecified: Secondary | ICD-10-CM | POA: Diagnosis not present

## 2018-05-03 MED ORDER — SODIUM CHLORIDE 0.9 % IV SOLN: 500.0000 mL | Freq: Once | INTRAVENOUS | Status: DC

## 2018-05-03 NOTE — Progress Notes (Signed)
Called to room to assist during endoscopic procedure.  Patient ID and intended procedure confirmed with present staff. Received instructions for my participation in the procedure from the performing physician.  

## 2018-05-03 NOTE — Progress Notes (Signed)
Report given to PACU, vss 

## 2018-05-03 NOTE — Op Note (Signed)
[facility] Patient Name: Desiree Krueger Procedure Date: 05/03/2018 2:06 PM MRN: [id number] Endoscopist: Mallie Mussel L. Loletha Carrow , MD Age: 47 Referring MD:  Date of Birth: 05/27/1971 Gender: Female Account #: [id number] Procedure:                Upper GI endoscopy Indications:              Abdominal bloating Medicines:                Monitored Anesthesia Care Procedure:                Pre-Anesthesia Assessment:                           - Prior to the procedure, a History and Physical                            was performed, and patient medications and                            allergies were reviewed. The patient's tolerance of                            previous anesthesia was also reviewed. The risks                            and benefits of the procedure and the sedation                            options and risks were discussed with the patient.                            All questions were answered, and informed consent                            was obtained. Prior Anticoagulants: The patient has                            taken no previous anticoagulant or antiplatelet                            agents. ASA Grade Assessment: II - A patient with                            mild systemic disease. After reviewing the risks                            and benefits, the patient was deemed in                            satisfactory condition to undergo the procedure.                           After obtaining informed consent, the endoscope was  passed under direct vision. Throughout the                            procedure, the patient's blood pressure, pulse, and                            oxygen saturations were monitored continuously. The                            Model GIF-[id number]) scope was introduced                            through the mouth, and advanced to the second part                            of duodenum. The upper GI  endoscopy was                            accomplished without difficulty. The patient                            tolerated the procedure well. Scope In: Scope Out: Findings:                 The esophagus was normal.                           The entire examined stomach was normal. This was                            biopsied with a cold forceps for Helicobacter                            pylori testing using CLOtest.                           The cardia and gastric fundus were normal on                            retroflexion.                           The examined duodenum was normal. Complications:            No immediate complications. Estimated Blood Loss:     Estimated blood loss was minimal. Impression:               - Normal esophagus.                           - Normal stomach. Biopsied.                           - Normal examined duodenum. Recommendation:           - Patient has a contact number available for  emergencies. The signs and symptoms of potential                            delayed complications were discussed with the                            patient. Return to normal activities tomorrow.                            Written discharge instructions were provided to the                            patient.                           - Resume previous diet.                           - Continue present medications.                           - Await pathology results.                           - See the other procedure note for documentation of                            additional recommendations. Desiree Krueger L. Loletha Carrow, MD 05/03/2018 3:02:23 PM This report has been signed electronically.

## 2018-05-03 NOTE — Patient Instructions (Signed)
YOU HAD AN ENDOSCOPIC PROCEDURE TODAY AT THE [facility]:   Refer to the procedure report that was given to you for any specific questions about what was found during the examination.  If the procedure report does not answer your questions, please call your gastroenterologist to clarify.  If you requested that your care partner not be given the details of your procedure findings, then the procedure report has been included in a sealed envelope for you to review at your convenience later.  YOU SHOULD EXPECT: Some feelings of bloating in the abdomen. Passage of more gas than usual.  Walking can help get rid of the air that was put into your GI tract during the procedure and reduce the bloating. If you had a lower endoscopy (such as a colonoscopy or flexible sigmoidoscopy) you may notice spotting of blood in your stool or on the toilet paper. If you underwent a bowel prep for your procedure, you may not have a normal bowel movement for a few days.  Please Note:  You might notice some irritation and congestion in your nose or some drainage.  This is from the oxygen used during your procedure.  There is no need for concern and it should clear up in a day or so.  SYMPTOMS TO REPORT IMMEDIATELY:   Following lower endoscopy (colonoscopy or flexible sigmoidoscopy):  Excessive amounts of blood in the stool  Significant tenderness or worsening of abdominal pains  Swelling of the abdomen that is new, acute  Fever of 100F or higher   For urgent or emergent issues, a gastroenterologist can be reached at any hour by calling [phone].   DIET:  We do recommend a small meal at first, but then you may proceed to your regular diet.  Drink plenty of fluids but you should avoid alcoholic beverages for 24 hours.  ACTIVITY:  You should plan to take it easy for the rest of today and you should NOT DRIVE or use heavy machinery until tomorrow (because of the sedation medicines used during the test).     FOLLOW UP: Our staff will call the number listed on your records the next business day following your procedure to check on you and address any questions or concerns that you may have regarding the information given to you following your procedure. If we do not reach you, we will leave a message.  However, if you are feeling well and you are not experiencing any problems, there is no need to return our call.  We will assume that you have returned to your regular daily activities without incident.  If any biopsies were taken you will be contacted by phone or by letter within the next 1-3 weeks.  Please call us at [phone] if you have not heard about the biopsies in 3 weeks.    SIGNATURES/CONFIDENTIALITY: You and/or your care partner have signed paperwork which will be entered into your electronic medical record.  These signatures attest to the fact that that the information above on your After Visit Summary has been reviewed and is understood.  Full responsibility of the confidentiality of this discharge information lies with you and/or your care-partner.   Resume medications.  

## 2018-05-03 NOTE — Op Note (Signed)
[facility] Patient Name: Desiree Krueger Procedure Date: 05/03/2018 2:06 PM MRN: [id number] Endoscopist: Mallie Mussel L. Loletha Carrow , MD Age: 47 Referring MD:  Date of Birth: 10-16-1970 Gender: Female Account #: [id number] Procedure:                Colonoscopy Indications:              Constipation, bloating Medicines:                Monitored Anesthesia Care Procedure:                Pre-Anesthesia Assessment:                           - Prior to the procedure, a History and Physical                            was performed, and patient medications and                            allergies were reviewed. The patient's tolerance of                            previous anesthesia was also reviewed. The risks                            and benefits of the procedure and the sedation                            options and risks were discussed with the patient.                            All questions were answered, and informed consent                            was obtained. Prior Anticoagulants: The patient has                            taken no previous anticoagulant or antiplatelet                            agents. ASA Grade Assessment: II - A patient with                            mild systemic disease. After reviewing the risks                            and benefits, the patient was deemed in                            satisfactory condition to undergo the procedure.                           After obtaining informed consent, the colonoscope  was passed under direct vision. Throughout the                            procedure, the patient's blood pressure, pulse, and                            oxygen saturations were monitored continuously. The                            Model [id number]-[id number] scope was introduced                            through the anus and advanced to the the cecum,                            identified by appendiceal  orifice and ileocecal                            valve. The colonoscopy was performed without                            difficulty. The patient tolerated the procedure                            well. The quality of the bowel preparation was                            good. The ileocecal valve, appendiceal orifice, and                            rectum were photographed. The quality of the bowel                            preparation was evaluated using the BBPS [facility]                            Bowel Preparation Scale) with scores of: Right                            Colon = 2, Transverse Colon = 2 and Left Colon = 2.                            The total BBPS score equals 6. Scope In: 2:44:07 PM Scope Out: 2:55:10 PM Scope Withdrawal Time: 0 hours 8 minutes 28 seconds  Total Procedure Duration: 0 hours 11 minutes 3 seconds  Findings:                 The perianal and digital rectal examinations were                            normal.                           The entire examined colon appeared normal on  direct                            and retroflexion views. Complications:            No immediate complications. Estimated Blood Loss:     Estimated blood loss: none. Impression:               - The entire examined colon is normal on direct and                            retroflexion views.                           - No specimens collected.                           Symptoms most consistent with IBS-C. Recommendation:           - Patient has a contact number available for                            emergencies. The signs and symptoms of potential                            delayed complications were discussed with the                            patient. Return to normal activities tomorrow.                            Written discharge instructions were provided to the                            patient.                           - Resume previous diet.                           -  Continue present medications.                           - Repeat colonoscopy in 10 years for screening                            purposes.                           - Return to my office at appointment to be                            scheduled to discuss treatment options. Keylan Costabile L. Loletha Carrow, MD 05/03/2018 3:07:56 PM This report has been signed electronically.

## 2018-05-04 ENCOUNTER — Telehealth: Payer: Self-pay

## 2018-05-04 ENCOUNTER — Telehealth: Payer: Self-pay | Admitting: *Deleted

## 2018-05-04 LAB — HELICOBACTER PYLORI SCREEN-BIOPSY: UREASE: NEGATIVE

## 2018-05-04 NOTE — Telephone Encounter (Signed)
  Follow up Call-  Call back number 05/03/2018  Post procedure Call Back phone  # [phone]  Permission to leave phone message Yes  Some recent data might be hidden     Patient questions:  Phone rang but then went dead.  Nothing was working. No message left.

## 2018-05-04 NOTE — Telephone Encounter (Signed)
Second post procedure follow up call, no answer 

## 2018-05-05 ENCOUNTER — Other Ambulatory Visit: Payer: Self-pay | Admitting: Family Medicine

## 2018-05-07 NOTE — Telephone Encounter (Signed)
Omeprazole 20 mg  refill Last Refill:04/06/18 # 60 Last OV: 01/05/18 PCP: [place]. [place]: [facility] [phone]

## 2018-05-09 ENCOUNTER — Telehealth: Payer: Self-pay

## 2018-05-09 NOTE — Telephone Encounter (Signed)
-----   Message from Doran Stabler, MD sent at 05/07/2018  5:33 PM EDT ----- Desiree Krueger,   Spanish-speaking patient  Stomach biopsy negative for H. pylori bacteria.  Please arrange an office follow-up regarding her ongoing symptoms   Kristen,  No path letter needed

## 2018-05-09 NOTE — Telephone Encounter (Signed)
Yesi, please contact this patient with results and schedule a follow up visit with Dr. Loletha Carrow. Thank you so much!

## 2018-05-16 ENCOUNTER — Encounter: Payer: Self-pay | Admitting: Gastroenterology

## 2018-05-16 NOTE — Telephone Encounter (Signed)
I have tried to reach Ms Desiree Krueger twice. Left a detailed message in [place]. Will send her a letter in the mail.

## 2018-05-16 NOTE — Telephone Encounter (Signed)
Thank you! Please include her results in the letter. I appreciate the help.

## 2018-05-23 ENCOUNTER — Encounter: Payer: Self-pay | Admitting: Gastroenterology

## 2018-05-23 ENCOUNTER — Ambulatory Visit (INDEPENDENT_AMBULATORY_CARE_PROVIDER_SITE_OTHER): Payer: BLUE CROSS/BLUE SHIELD | Admitting: Gastroenterology

## 2018-05-23 ENCOUNTER — Encounter (INDEPENDENT_AMBULATORY_CARE_PROVIDER_SITE_OTHER): Payer: Self-pay

## 2018-05-23 VITALS — BP 88/58 | HR 77 | Ht 63.0 in | Wt 125.8 lb

## 2018-05-23 DIAGNOSIS — R1010 Upper abdominal pain, unspecified: Secondary | ICD-10-CM

## 2018-05-23 DIAGNOSIS — R140 Abdominal distension (gaseous): Secondary | ICD-10-CM

## 2018-05-23 DIAGNOSIS — K5904 Chronic idiopathic constipation: Secondary | ICD-10-CM

## 2018-05-23 NOTE — Progress Notes (Signed)
     [facility] Progress Note  Chief Complaint: Bloating and constipation.  Subjective  History:  Desiree Krueger was seen in follow-up for her constipation and bloating after recent endoscopic procedures.  An interpreter was present for the entire encounter. If she does not take 3 magnesium tablets, she will not have a BM for at least a week.  During that time, she does not have any station of need for a bowel movement, urgency or pressure.  She is tried multiple other OTC remedies without improvement.  She is also bothered by upper abdominal bloating that seems to worsen when she lays down at night or if she lifts something heavy.  She was concerned because she believes she had lost a lot of weight, however records show she is down 3 pounds since May of this year and only 4 pounds overall since June 2018.  ROS: Cardiovascular:  no chest pain Respiratory: no dyspnea Remainder of systems negative except as above  the patient's Past Medical, Family and Social History were reviewed and are on file in the EMR.  Objective:  Med list reviewed  Current Outpatient Medications:  Marland Kitchen  Magnesium 70 MG CAPS, Take by mouth., Disp: , Rfl:  .  omeprazole (PRILOSEC) 20 MG capsule, TAKE ONE CAPSULE BY MOUTH TWICE DAILY BEFORE A MEAL, Disp: 60 capsule, Rfl: 0   Vital signs in last 24 hrs: Vitals:   05/23/18 1559  BP: (!) 88/58  Pulse: 77    Physical Exam  She is well-appearing without muscle wasting  HEENT: sclera anicteric, oral mucosa moist without lesions  Neck: supple, no thyromegaly, JVD or lymphadenopathy  Cardiac: RRR without murmurs, S1S2 heard, no peripheral edema  Pulm: clear to auscultation bilaterally, normal RR and effort noted  Abdomen: soft, no tenderness, with active bowel sounds. No guarding or palpable hepatosplenomegaly.  Skin; warm and dry, no jaundice or rash  EGD and colonoscopy normal  Radiologic studies:  Clo test  negative  @ASSESSMENTPLANBEGIN @ Assessment: Encounter Diagnoses  Name Primary?  . Chronic idiopathic constipation Yes  . Upper abdominal pain   . Abdominal bloating    I think she has chronic idiopathic constipation.  From her initial history, it also was apparently bothering her years ago when she was living in [place].  I did my best to explain this condition in detail, as well as her limited understanding of it and available therapies.  Many OTC therapies have not worked for her, so I offered her a trial of prescription medicine.  She is disappointed at the lack of a clear cause/cure for this, but is willing to try some therapies.   Plan: Linzess at both 72 mcg and 145 mcg doses were given trial over the next couple of weeks. I would like to hear from her soon about whether or not that is helping.  Total time 25 minutes, over half spent face-to-face with patient in counseling and coordination of care.  Time required for which barrier.  Nelida Meuse III

## 2018-05-23 NOTE — Patient Instructions (Signed)
If you are age 47 or older, your body mass index should be between 23-30. Your Body mass index is 22.28 kg/m. If this is out of the aforementioned range listed, please consider follow up with your Primary Care Provider.  If you are age 47 or younger, your body mass index should be between 19-25. Your Body mass index is 22.28 kg/m. If this is out of the aformentioned range listed, please consider follow up with your Primary Care Provider.   We have given you samples of the following medication to take: Linzess 60mcg [id number] exp 09-2020                                                                                                        Linzess 130mcg [id number] exp 10-2018      Please call for refills on the Linzess.   It was a pleasure to see you today!  Dr. Loletha Carrow

## 2018-06-20 ENCOUNTER — Other Ambulatory Visit: Payer: Self-pay

## 2018-06-20 ENCOUNTER — Telehealth: Payer: Self-pay | Admitting: Gastroenterology

## 2018-06-20 MED ORDER — LINACLOTIDE 72 MCG PO CAPS: 72.0000 ug | ORAL_CAPSULE | Freq: Every day | ORAL | 1 refills | Status: DC

## 2018-06-20 NOTE — Telephone Encounter (Signed)
Pt tolerating the Linzess well and is requesting a Rx sent to the pharmacy. Linzess 72 mcg #30 1 rf. Rx sent

## 2018-11-29 ENCOUNTER — Telehealth: Payer: BLUE CROSS/BLUE SHIELD | Admitting: Family Medicine

## 2019-09-04 ENCOUNTER — Ambulatory Visit (INDEPENDENT_AMBULATORY_CARE_PROVIDER_SITE_OTHER): Payer: 59 | Admitting: Obstetrics & Gynecology

## 2019-09-04 ENCOUNTER — Other Ambulatory Visit: Payer: Self-pay

## 2019-09-04 ENCOUNTER — Encounter: Payer: Self-pay | Admitting: Obstetrics & Gynecology

## 2019-09-04 VITALS — BP 104/70

## 2019-09-04 DIAGNOSIS — N898 Other specified noninflammatory disorders of vagina: Secondary | ICD-10-CM | POA: Diagnosis not present

## 2019-09-04 DIAGNOSIS — N393 Stress incontinence (female) (male): Secondary | ICD-10-CM

## 2019-09-04 DIAGNOSIS — R339 Retention of urine, unspecified: Secondary | ICD-10-CM | POA: Diagnosis not present

## 2019-09-04 NOTE — Progress Notes (Signed)
    Desiree Krueger [birth year] [id number]        48 y.o.  [id number]   RP: Stress urinary incontinence and incomplete emptying of the of the bladder.  HPI: Endometrial Ablation 09/2017.  No vaginal bleeding.  Complains of stress urinary incontinence especially with coughing.  Her main problem is that she feels she is not able to completely empty her bladder.  No burning with urination.  Complains of vaginal itching.  No blood in urine.  No fever.   OB History  Gravida Para Term Preterm AB Living  4 2     2 2   SAB TAB Ectopic Multiple Live Births  2            # Outcome Date GA Lbr Len/2nd Weight Sex Delivery Anes PTL Lv  4 SAB           3 SAB           2 Para           1 Para             Past medical history,surgical history, problem list, medications, allergies, family history and social history were all reviewed and documented in the EPIC chart.   Directed ROS with pertinent positives and negatives documented in the history of present illness/assessment and plan.  Exam:  Vitals:   09/04/19 1626  BP: 104/70   General appearance:  Normal  Abdomen: Normal  Gynecologic exam: Vulva normal.  Speculum:  Cervix/Vagina normal.  Vaginal d/c wnl.  Wet prep done.  Bimanual exam:  Uterus AV, normal volume, mobile, NT.  No adnexal mass, NT.  Cystocele grade 1/4 with valsalva.  Wet prep Negative U/A:  Yellow clear, protein negative, nitrite negative, WBC neg, RBC 3-10, Bacteria negative.     Assessment/Plan:  49 y.o. [id number]   1. SUI (stress urinary incontinence, female) Avoid pelvic pressure, especially avoiding heavy lifting, treating cough and constipation.  Avoid overfilling the bladder.  Recommend Kegel exercises.  Refer to Urology Dr Berdine Dance at [facility] for Urodynamic testing and management. - Urinalysis,Complete w/RFL Culture  2. Incomplete emptying of bladder Cystocele grade 1/4 only.  May need to change position and stay longer at the restroom to  completely empty the bladder.  Urine analysis negative.  Refer to Urology for Urodynamic testing and management. - Urinalysis,Complete w/RFL Culture  3. Vaginal itching Wet prep negative, patient reassured. - WET PREP FOR TRICH, YEAST, CLUE  Princess Bruins MD, 4:46 PM 09/04/2019

## 2019-09-05 LAB — WET PREP FOR TRICH, YEAST, CLUE

## 2019-09-06 ENCOUNTER — Encounter: Payer: Self-pay | Admitting: Obstetrics & Gynecology

## 2019-09-06 NOTE — Patient Instructions (Signed)
1. SUI (stress urinary incontinence, female) Avoid pelvic pressure, especially avoiding heavy lifting, treating cough and constipation.  Avoid overfilling the bladder.  Recommend Kegel exercises.  Refer to Urology Dr Berdine Dance at [facility] for Urodynamic testing and management. - Urinalysis,Complete w/RFL Culture  2. Incomplete emptying of bladder Cystocele grade 1/4 only.  May need to change position and stay longer at the restroom to completely empty the bladder.  Urine analysis negative.  Refer to Urology for Urodynamic testing and management. - Urinalysis,Complete w/RFL Culture  3. Vaginal itching Wet prep negative, patient reassured. - WET PREP FOR [place], YEAST, CLUE  Desiree Krueger, it was a pleasure seeing you today!

## 2019-09-07 LAB — URINALYSIS, COMPLETE W/RFL CULTURE
Bacteria, UA: NONE SEEN /HPF
Bilirubin Urine: NEGATIVE
Glucose, UA: NEGATIVE
Hyaline Cast: NONE SEEN /LPF
Ketones, ur: NEGATIVE
Leukocyte Esterase: NEGATIVE
Nitrites, Initial: NEGATIVE
Protein, ur: NEGATIVE
Specific Gravity, Urine: 1.015 (ref 1.001–1.03)
WBC, UA: NONE SEEN /HPF (ref 0–5)
pH: 6 (ref 5.0–8.0)

## 2019-09-07 LAB — URINE CULTURE
MICRO NUMBER:: 10066112
Result:: NO GROWTH
SPECIMEN QUALITY:: ADEQUATE

## 2019-09-07 LAB — CULTURE INDICATED

## 2019-09-11 ENCOUNTER — Telehealth: Payer: Self-pay | Admitting: *Deleted

## 2019-09-11 NOTE — Telephone Encounter (Signed)
Patient scheduled with Dr. Royce Macadamia 09/27/19 @ 9:30am , notes faxed to [phone]. Left message for patient

## 2019-09-11 NOTE — Telephone Encounter (Signed)
-----   Message from Princess Bruins, MD sent at 09/04/2019  5:11 PM EST ----- Regarding: Refer to Urologist Dr Berdine Dance [facility] SUI/Difficulty completely emptying her bladder.  Cystocele Grade 1/4 only with valsalva.  Berdine Dance [facility], [place] [id number].

## 2019-09-11 NOTE — Telephone Encounter (Signed)
Claudia informed patient in Spanish

## 2020-01-02 ENCOUNTER — Ambulatory Visit: Payer: Self-pay | Attending: Internal Medicine

## 2020-01-23 ENCOUNTER — Ambulatory Visit: Payer: Self-pay | Attending: Internal Medicine

## 2020-01-23 DIAGNOSIS — Z23 Encounter for immunization: Secondary | ICD-10-CM

## 2020-01-23 NOTE — Progress Notes (Signed)
   Covid-19 Vaccination Clinic  Name:  Desiree Krueger    MRN: [id number] DOB: [birth year]  01/23/2020  Desiree Krueger was observed post Covid-19 immunization for 15 minutes without incident. She was provided with Vaccine Information Sheet and instruction to access the V-Safe system.   Desiree Krueger was instructed to call 911 with any severe reactions post vaccine: Marland Kitchen Difficulty breathing  . Swelling of face and throat  . A fast heartbeat  . A bad rash all over body  . Dizziness and weakness   Immunizations Administered    Name Date Dose VIS Date Route   Pfizer COVID-19 Vaccine 01/23/2020  3:13 PM 0.3 mL 10/09/2018 Intramuscular   Manufacturer: [employer], [employer]   Lot: [id number]   [place]: [id number]

## 2020-02-04 ENCOUNTER — Other Ambulatory Visit: Payer: Self-pay

## 2020-02-04 ENCOUNTER — Ambulatory Visit: Payer: 59 | Admitting: Emergency Medicine

## 2020-02-04 ENCOUNTER — Encounter: Payer: Self-pay | Admitting: Emergency Medicine

## 2020-02-04 VITALS — BP 100/62 | HR 86 | Temp 98.3°F | Resp 18 | Ht 63.0 in | Wt 129.8 lb

## 2020-02-04 DIAGNOSIS — R300 Dysuria: Secondary | ICD-10-CM | POA: Diagnosis not present

## 2020-02-04 DIAGNOSIS — M549 Dorsalgia, unspecified: Secondary | ICD-10-CM

## 2020-02-04 DIAGNOSIS — N390 Urinary tract infection, site not specified: Secondary | ICD-10-CM | POA: Diagnosis not present

## 2020-02-04 LAB — POCT URINALYSIS DIP (MANUAL ENTRY)
Bilirubin, UA: NEGATIVE
Glucose, UA: NEGATIVE mg/dL
Ketones, POC UA: NEGATIVE mg/dL
Nitrite, UA: NEGATIVE
Protein Ur, POC: NEGATIVE mg/dL
Spec Grav, UA: 1.025 (ref 1.010–1.025)
Urobilinogen, UA: 0.2 E.U./dL
pH, UA: 6.5 (ref 5.0–8.0)

## 2020-02-04 MED ORDER — AMOXICILLIN-POT CLAVULANATE 875-125 MG PO TABS: 1.0000 | ORAL_TABLET | Freq: Two times a day (BID) | ORAL | 0 refills | Status: AC

## 2020-02-04 NOTE — Patient Instructions (Addendum)
If you have lab work done today you will be contacted with your lab results within the next 2 weeks.  If you have not heard from [place] then please contact us. The fastest way to get your results is to register for My Chart.   IF you received an x-ray today, you will receive an invoice from [facility]. Please contact [facility] at [phone] with questions or concerns regarding your invoice.   IF you received labwork today, you will receive an invoice from [place]. Please contact [facility] at [phone] with questions or concerns regarding your invoice.   Our billing staff will not be able to assist you with questions regarding bills from these companies.  You will be contacted with the lab results as soon as they are available. The fastest way to get your results is to activate your My Chart account. Instructions are located on the last page of this paperwork. If you have not heard from [place] regarding the results in 2 weeks, please contact this office.      Infeccin urinaria en los adultos Urinary Tract Infection, Adult Una infeccin urinaria (IU) puede ocurrir en Clinical cytogeneticist de las vas [place]. Las vas urinarias incluyen lo siguiente:  Los riones.  Los urteres.  La vejiga.  La uretra. Estos rganos fabrican, almacenan y eliminan el pis (orina) del cuerpo. Cules son las causas? La causa es la presencia de grmenes (bacterias) en la zona genital. Estos grmenes proliferan y causan hinchazn (inflamacin) de las vas urinarias. Qu incrementa el riesgo? Es ms probable que contraiga esta afeccin si:  Tiene colocado un tubo delgado y pequeo (catter) para drenar el pis.  Nopuede controlar la evacuacin de pis ni de materia fecal (incontinencia).  Es Art therapist y, adems: ? [place] estos mtodos para Therapist, occupational:  Un medicamento que [employer] espermatozoides (espermicida).  Un dispositivo que impide el paso de los espermatozoides  (diafragma). ? Tieneniveles bajos de una hormona femenina (estrgeno). ? Est embarazada.  Tiene genes que [employer].  Es sexualmente activa.  Toma antibiticos.  Tiene dificultad para orinar debido a: ? Su prstata es ms grande de lo normal, si usted es hombre. ? Obstruccin en la parte del cuerpo que drena el pis de la vejiga (uretra). ? Clculo renal. ? Untrastorno nervioso que afecta la vejiga (vejiga neurgena). ? No bebe una cantidad suficiente de lquido. ? No hace pis con la frecuencia suficiente.  Tiene otras afecciones, como: ? Diabetes. ? Un sistema que combate las enfermedades (sistema inmunitario) debilitado. ? Anemia drepanoctica. ? Gota. ? Lesin en la columna vertebral. Cules son los signos o los sntomas? Los sntomas de esta afeccin incluyen:  Necesidad inmediata (urgente) de hacer pis.  Hacer pis con frecuencia.  Hacer poca cantidad de pis con mucha frecuencia.  Dolor o ardor al [employer].  Sangre en el pis.  Pis que huele mal o anormal.  Dificultad para hacer pis.  Pis turbio.  Lquido que sale de la vagina, si es [place].  Dolor en la barriga o en la parte baja de la espalda. Otros sntomas pueden incluir los siguientes:  Vmitos.  No sentir deseos de comer.  Sentirse confundido (confuso).  Sentirse cansado y malhumorado (irritable).  Cristy Hilts.  Materia fecal lquida (diarrea). Cmo se trata? El tratamiento de esta afeccin puede incluir:  Antibiticos.  Otros medicamentos.  Beber una cantidad suficiente de agua. Sigue estas instrucciones en tu casa:  [employer] [place] y  los recetados solamente como se lo haya indicado el mdico.  Si le recetaron un antibitico, tmelo como se lo haya indicado el mdico. No deje de tomarlo aunque comience a sentirse mejor. Indicaciones generales  Asegrese de hacer lo siguiente: ? Haga pis hasta que la vejiga quede vaca. ? Nocontenga el  pis durante mucho tiempo. ? Vace la vejiga despus de Clinical biochemist. ? Lmpiese de adelante hacia atrs despus de defecar, si es mujer. Use cada trozo de papel higinico solo una vez cuando se limpie.  Beba suficiente lquido como para Theatre manager la orina de color [place]illo plido.  Concurra a todas las visitas de seguimiento como se lo haya indicado el mdico. Esto es importante. Comunquese con un mdico si:  No mejora despus de 1 o 2das de tratamiento.  Los sntomas desaparecen y Teacher, adult education. Solicite ayuda inmediatamente si:  Tiene un dolor muy intenso en la espalda.  Tiene dolor muy intenso en la parte baja de la barriga.  Tener fiebre.  Tiene Higher education careers adviser (nuseas).  Tiene vmitos. Resumen  Una infeccin urinaria (IU) puede ocurrir en Clinical cytogeneticist de las vas [place].  Esta afeccin es causada por la presencia de grmenes en la zona genital.  Existen muchos factores de riesgo de sufrir una IU. Estos incluyen tener colocado un tubo delgado y pequeo para drenar el pis y no poder controlar cundo hace pis y materia fecal.  El tratamiento incluye antibiticos contra los grmenes.  Beba suficiente lquido como para Theatre manager la orina de color [place]illo plido. Esta informacin no tiene Marine scientist el consejo del mdico. Asegrese de hacerle al mdico cualquier pregunta que tenga. Document Revised: 07/25/2018 Document Reviewed: 07/25/2018 Elsevier Patient Education  2020 [employer].

## 2020-02-04 NOTE — Progress Notes (Signed)
3/9

## 2020-02-04 NOTE — Progress Notes (Signed)
Desiree Krueger 49 y.o.   Chief Complaint  Patient presents with  . Back Pain    Patient states she has been having lower back pain and burning when urinating for 3 weeks. Patient has been drinking alot of fluids to see if it will goes away. Per patient its now itching or discharge but does have a very strong smell.    HISTORY OF PRESENT ILLNESS: This is a 49 y.o. female complaining of burning on urination that started 3 weeks ago. Also complaining of lumbar pain.  Has a very physical job. Denies nausea, vomiting, fever or chills.  Noticed blood in the urine last week. Had intermittent episodes of urinary incontinence earlier this year.  Saw her GYN doctor.  Normal pelvic exam and work-up.  Was referred to urologist but never saw him due to insurance issues. No other complaints or medical concerns today.  HPI   Prior to Admission medications   Not on File    No Known Allergies  Patient Active Problem List   Diagnosis Date Noted  . Anemia due to chronic blood loss 03/16/2016  . 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 DUB (dysfunctional uterine bleeding) 02/22/2016    Past Medical History:  Diagnosis Date  . Anemia   . Constipation   . Depression   . Dysrhythmia   . GERD (gastroesophageal reflux disease)   . Headache    MIGRAINES  . Low blood pressure   . Lower back pain   . Thyroid disease   . Varicose vein of leg    BILATERAL    Past Surgical History:  Procedure Laterality Date  . DILATATION & CURETTAGE/HYSTEROSCOPY WITH MYOSURE N/A 01/31/2017   Procedure: DILATATION & CURETTAGE/HYSTEROSCOPY WITH MYOSURE;  Surgeon: Terrance Mass, MD;  Location: [facility];  Service: Gynecology;  Laterality: N/A;  . DILATATION & CURETTAGE/HYSTEROSCOPY WITH MYOSURE N/A 09/29/2017   Procedure: DILATATION & CURETTAGE/HYSTEROSCOPY WITH MYOSURE RESECTION OF SUBMUCOSAL MYOMA;  Surgeon: Princess Bruins, MD;  Location: [facility];  Service: Gynecology;   Laterality: N/A;  request to follow 2nd case around 10am in [place] block time Requests one hour OR time  . HYSTEROSCOPY WITH NOVASURE N/A 09/29/2017   Procedure: HYSTEROSCOPY WITH NOVASURE ENDOMETRIAL ABLATION;  Surgeon: Princess Bruins, MD;  Location: [facility];  Service: Gynecology;  Laterality: N/A;  . TUBAL LIGATION      Social History   Socioeconomic History  . Marital status: Single    Spouse name: Not on file  . Number of children: Not on file  . Years of education: Not on file  . Highest education level: Not on file  Occupational History  . Not on file  Tobacco Use  . Smoking status: Former Smoker    Years: 6.00    Types: Cigarettes  . Smokeless tobacco: Never Used  Vaping Use  . Vaping Use: Never used  Substance and Sexual Activity  . Alcohol use: Yes    Alcohol/week: 0.0 standard drinks    Comment: OCC  . Drug use: No  . Sexual activity: Not Currently  Other Topics Concern  . Not on file  Social History Narrative  . Not on file   Social Determinants of Health   Financial Resource Strain:   . Difficulty of Paying Living Expenses:   Food Insecurity:   . Worried About Charity fundraiser in the Last Year:   . Arboriculturist in the Last Year:   Transportation Needs:   .  Lack of Transportation (Medical):   Marland Kitchen Lack of Transportation (Non-Medical):   Physical Activity:   . Days of Exercise per Week:   . Minutes of Exercise per Session:   Stress:   . Feeling of Stress :   Social Connections:   . Frequency of Communication with Friends and Family:   . Frequency of Social Gatherings with Friends and Family:   . Attends Religious Services:   . Active Member of Clubs or Organizations:   . Attends Archivist Meetings:   Marland Kitchen Marital Status:   Intimate Partner Violence:   . Fear of Current or Ex-Partner:   . Emotionally Abused:   Marland Kitchen Physically Abused:   . Sexually Abused:     Family History  Problem Relation Age of Onset    . Cancer Maternal Uncle        COLON  . Hypertension Father   . Colon cancer Neg Hx   . Esophageal cancer Neg Hx   . Rectal cancer Neg Hx   . Stomach cancer Neg Hx      Review of Systems  Constitutional: Negative.  Negative for chills and fever.  HENT: Negative.  Negative for congestion and sore throat.   Respiratory: Negative.  Negative for cough and shortness of breath.   Cardiovascular: Negative.  Negative for chest pain and palpitations.  Gastrointestinal: Negative.  Negative for abdominal pain, diarrhea, nausea and vomiting.  Genitourinary: Positive for dysuria, frequency, hematuria and urgency.  Skin: Negative.  Negative for rash.  Neurological: Negative.  Negative for dizziness and headaches.  All other systems reviewed and are negative.    Today's Vitals   02/04/20 1018  BP: 100/62  Pulse: 86  Resp: 18  Temp: 98.3 F (36.8 C)  TempSrc: Temporal  SpO2: 98%  Weight: 129 lb 12.8 oz (58.9 kg)  Height: 5\' 3"  (1.6 m)   Body mass index is 22.99 kg/m.   Physical Exam Vitals reviewed.  Constitutional:      Appearance: Normal appearance.  HENT:     Head: Normocephalic.     Mouth/Throat:     Mouth: Mucous membranes are moist.     Pharynx: Oropharynx is clear.  Eyes:     Extraocular Movements: Extraocular movements intact.     Conjunctiva/sclera: Conjunctivae normal.     Pupils: Pupils are equal, round, and reactive to light.  Cardiovascular:     Rate and Rhythm: Normal rate and regular rhythm.     Pulses: Normal pulses.     Heart sounds: Normal heart sounds.  Pulmonary:     Effort: Pulmonary effort is normal.     Breath sounds: Normal breath sounds.  Abdominal:     Palpations: Abdomen is soft.     Tenderness: There is no abdominal tenderness.  Musculoskeletal:        General: Normal range of motion.     Cervical back: Normal range of motion and neck supple.  Skin:    General: Skin is warm and dry.     Capillary Refill: Capillary refill takes less than  2 seconds.  Neurological:     General: No focal deficit present.     Mental Status: She is alert and oriented to person, place, and time.  Psychiatric:        Mood and Affect: Mood normal.        Behavior: Behavior normal.    Results for orders placed or performed in visit on 02/04/20 (from the past 24 hour(s))  POCT urinalysis dipstick  Status: Abnormal   Collection Time: 02/04/20 10:34 AM  Result Value Ref Range   Color, UA yellow yellow   Clarity, UA cloudy (A) clear   Glucose, UA negative negative mg/dL   Bilirubin, UA negative negative   Ketones, POC UA negative negative mg/dL   Spec Grav, UA 1.025 1.010 - 1.025   Blood, UA moderate (A) negative   pH, UA 6.5 5.0 - 8.0   Protein Ur, POC negative negative mg/dL   Urobilinogen, UA 0.2 0.2 or 1.0 E.U./dL   Nitrite, UA Negative Negative   Leukocytes, UA Large (3+) (A) Negative     ASSESSMENT & PLAN: Neetu was seen today for back pain.  Diagnoses and all orders for this visit:  Burning with urination -     POCT urinalysis dipstick  Acute UTI -     Urine Culture -     amoxicillin-clavulanate (AUGMENTIN) 875-125 MG tablet; Take 1 tablet by mouth 2 (two) times daily for 7 days.  Musculoskeletal back pain Comments: Lumbar area    Patient Instructions       If you have lab work done today you will be contacted with your lab results within the next 2 weeks.  If you have not heard from [place] then please contact us. The fastest way to get your results is to register for My Chart.   IF you received an x-ray today, you will receive an invoice from [facility]. Please contact [facility] at [phone] with questions or concerns regarding your invoice.   IF you received labwork today, you will receive an invoice from [facility]. Please contact [facility] at [phone] with questions or concerns regarding your invoice.   Our billing staff will not be able to assist you with questions regarding bills from  these companies.  You will be contacted with the lab results as soon as they are available. The fastest way to get your results is to activate your My Chart account. Instructions are located on the last page of this paperwork. If you have not heard from [place] regarding the results in 2 weeks, please contact this office.      Infeccin urinaria en los adultos Urinary Tract Infection, Adult Una infeccin urinaria (IU) puede ocurrir en Clinical cytogeneticist de las vas [place]. Las vas urinarias incluyen lo siguiente:  Los riones.  Los urteres.  La vejiga.  La uretra. Estos rganos fabrican, almacenan y eliminan el pis (orina) del cuerpo. Cules son las causas? La causa es la presencia de grmenes (bacterias) en la zona genital. Estos grmenes proliferan y causan hinchazn (inflamacin) de las vas urinarias. Qu incrementa el riesgo? Es ms probable que contraiga esta afeccin si:  Tiene colocado un tubo delgado y pequeo (catter) para drenar el pis.  Nopuede controlar la evacuacin de pis ni de materia fecal (incontinencia).  Es Art therapist y, adems: ? [place] estos mtodos para Therapist, occupational:  Un medicamento que [employer] espermatozoides (espermicida).  Un dispositivo que impide el paso de los espermatozoides (diafragma). ? Tieneniveles bajos de una hormona femenina (estrgeno). ? Est embarazada.  Tiene genes que [employer].  Es sexualmente activa.  Toma antibiticos.  Tiene dificultad para orinar debido a: ? Su prstata es ms grande de lo normal, si usted es hombre. ? Obstruccin en la parte del cuerpo que drena el pis de la vejiga (uretra). ? Clculo renal. ? Untrastorno nervioso que afecta la vejiga (vejiga neurgena). ? No bebe una cantidad suficiente de lquido. ? No hace pis con  la frecuencia suficiente.  Tiene otras afecciones, como: ? Diabetes. ? Un sistema que combate las enfermedades (sistema inmunitario) debilitado. ? Anemia  drepanoctica. ? Gota. ? Lesin en la columna vertebral. Cules son los signos o los sntomas? Los sntomas de esta afeccin incluyen:  Necesidad inmediata (urgente) de hacer pis.  Hacer pis con frecuencia.  Hacer poca cantidad de pis con mucha frecuencia.  Dolor o ardor al [employer].  Sangre en el pis.  Pis que huele mal o anormal.  Dificultad para hacer pis.  Pis turbio.  Lquido que sale de la vagina, si es [place].  Dolor en la barriga o en la parte baja de la espalda. Otros sntomas pueden incluir los siguientes:  Vmitos.  No sentir deseos de comer.  Sentirse confundido (confuso).  Sentirse cansado y malhumorado (irritable).  Cristy Hilts.  Materia fecal lquida (diarrea). Cmo se trata? El tratamiento de esta afeccin puede incluir:  Antibiticos.  Otros medicamentos.  Beber una cantidad suficiente de agua. Sigue estas instrucciones en tu casa:  Medicamentos  [employer] de venta libre y los recetados solamente como se lo haya indicado el mdico.  Si le recetaron un antibitico, tmelo como se lo haya indicado el mdico. No deje de tomarlo aunque comience a sentirse mejor. Indicaciones generales  Asegrese de hacer lo siguiente: ? Haga pis hasta que la vejiga quede vaca. ? Nocontenga el pis durante mucho tiempo. ? Vace la vejiga despus de Clinical biochemist. ? Lmpiese de adelante hacia atrs despus de defecar, si es mujer. Use cada trozo de papel higinico solo una vez cuando se limpie.  Beba suficiente lquido como para Theatre manager la orina de color amarillo plido.  Concurra a todas las visitas de seguimiento como se lo haya indicado el mdico. Esto es importante. Comunquese con un mdico si:  No mejora despus de 1 o 2das de tratamiento.  Los sntomas desaparecen y Teacher, adult education. Solicite ayuda inmediatamente si:  Tiene un dolor muy intenso en la espalda.  Tiene dolor muy intenso en la parte baja de la  barriga.  Tener fiebre.  Tiene Higher education careers adviser (nuseas).  Tiene vmitos. Resumen  Una infeccin urinaria (IU) puede ocurrir en Clinical cytogeneticist de las vas [place].  Esta afeccin es causada por la presencia de grmenes en la zona genital.  Existen muchos factores de riesgo de sufrir una IU. Estos incluyen tener colocado un tubo delgado y pequeo para drenar el pis y no poder controlar cundo hace pis y materia fecal.  El tratamiento incluye antibiticos contra los grmenes.  Beba suficiente lquido como para Theatre manager la orina de color amarillo plido. Esta informacin no tiene Marine scientist el consejo del mdico. Asegrese de hacerle al mdico cualquier pregunta que tenga. Document Revised: 07/25/2018 Document Reviewed: 07/25/2018 Elsevier Patient Education  2020 Elsevier Inc.      Agustina Caroli, MD [facility]

## 2020-02-05 LAB — URINE CULTURE

## 2020-10-07 ENCOUNTER — Ambulatory Visit: Payer: 59 | Admitting: Emergency Medicine

## 2020-10-07 ENCOUNTER — Other Ambulatory Visit: Payer: Self-pay

## 2020-10-07 ENCOUNTER — Encounter: Payer: Self-pay | Admitting: Emergency Medicine

## 2020-10-07 VITALS — BP 104/66 | HR 86 | Temp 98.4°F | Resp 16 | Ht 63.0 in | Wt 135.0 lb

## 2020-10-07 DIAGNOSIS — G43819 Other migraine, intractable, without status migrainosus: Secondary | ICD-10-CM

## 2020-10-07 LAB — POCT URINALYSIS DIP (MANUAL ENTRY)
Bilirubin, UA: NEGATIVE
Glucose, UA: NEGATIVE mg/dL
Ketones, POC UA: NEGATIVE mg/dL
Leukocytes, UA: NEGATIVE
Nitrite, UA: NEGATIVE
Protein Ur, POC: NEGATIVE mg/dL
Spec Grav, UA: 1.015 (ref 1.010–1.025)
Urobilinogen, UA: 0.2 E.U./dL
pH, UA: 7 (ref 5.0–8.0)

## 2020-10-07 MED ORDER — SUMATRIPTAN SUCCINATE 50 MG PO TABS: ORAL_TABLET | ORAL | 1 refills | Status: DC

## 2020-10-07 NOTE — Patient Instructions (Addendum)
If you have lab work done today you will be contacted with your lab results within the next 2 weeks.  If you have not heard from [place] then please contact us. The fastest way to get your results is to register for My Chart.   IF you received an x-ray today, you will receive an invoice from [facility]. Please contact [facility] at [phone] with questions or concerns regarding your invoice.   IF you received labwork today, you will receive an invoice from [place]. Please contact [facility] at [phone] with questions or concerns regarding your invoice.   Our billing staff will not be able to assist you with questions regarding bills from these companies.  You will be contacted with the lab results as soon as they are available. The fastest way to get your results is to activate your My Chart account. Instructions are located on the last page of this paperwork. If you have not heard from [place] regarding the results in 2 weeks, please contact this office.     Cefalea migraosa Migraine Headache Una cefalea migraosa es un dolor muy intenso y punzante en uno o ambos lados de la cabeza. Este tipo de dolor de cabeza tambin puede causar otros sntomas. Puede durar desde 4horas hasta 3das. Hable con su mdico sobre las cosas que pueden causar (desencadenar) esta afeccin. Cules son las causas? Se desconoce la causa exacta de esta afeccin. Esta afeccin puede desencadenarse o ser causada por lo siguiente:  Consumo de alcohol.  Consumo de cigarrillos.  Tomar medicamentos como por ejemplo: ? Medicamentos para Best boy torcico (nitroglicerina). ? Anticonceptivos orales. ? Estrgeno. ? Algunos medicamentos para la presin arterial.  Comer o beber ciertos productos.  Hacer actividad fsica. Otros factores que pueden provocar cefalea migraosa son los siguientes:  Tener el perodo menstrual.  Glennis Brink.  Hambre.  Estrs.  No dormir lo suficiente o  dormir demasiado.  Cambios climticos.  Cansancio (fatiga). Qu incrementa el riesgo?  Tener entre 25 y 16aos de edad.  Ser mujer.  Tener antecedentes familiares de cefalea migraosa.  Ser de Science writer.  Tener depresin o ansiedad.  Tener mucho sobrepeso. Cules son los signos o los sntomas?  Un dolor punzante. Este dolor puede [employer] siguientes caractersticas: ? Audiological scientist en cualquier regin de la cabeza, tanto de un lado como de [place]. ? Puede dificultar las actividades cotidianas. ? Puede empeorar con la actividad fsica. ? Puede empeorar con las luces brillantes o los ruidos fuertes.  Otros sntomas pueden incluir: ? Ganas de vomitar (nuseas). ? Vmitos. ? Mareos. ? Sensibilidad a las luces brillantes, los ruidos fuertes o [employer].  Antes de tener una cefalea migraosa, puede recibir seales de advertencia (aura). Un aura puede incluir: ? Ver luces intermitentes o tener puntos ciegos. ? Ver puntos brillantes, halos o lneas en zigzag. ? Tener una visin en tnel o visin borrosa. ? Sentir entumecimiento u hormigueo. ? Tener dificultad para hablar. ? Tener msculos dbiles.  Algunas personas tienen sntomas despus de una cefalea migraosa (fase posdromal), como los siguientes: ? Cansancio. ? Dificultad para pensar (concentrarse). Cmo se trata?  Tomar medicamentos para: ? Best boy. ? Aliviar la sensacin de Higher education careers adviser. ? Prevenir las Psychologist, occupational.  El tratamiento tambin puede incluir lo siguiente: ? Tomar sesiones de acupuntura. ? Evitar los alimentos que provocan las cefaleas migraosas. ? Aprender Orland Jarred de controlar las funciones corporales (biorretroalimentacin). ? [place] para ayudarlo a Civil engineer, contracting y lidiar con los pensamientos  negativos (terapia cognitivo conductual). Siga estas instrucciones en su casa: Medicamentos  [employer] de venta libre y los recetados solamente como se lo haya  indicado el mdico.  Consulte a su mdico si el medicamento que le recetaron: ? Hace que sea necesario que evite conducir o usar maquinaria pesada. ? Puede causarle dificultad para defecar (estreimiento). Es posible que deba tomar estas medidas para prevenir o tratar los problemas para defecar:  Electronics engineer suficiente lquido para Contractor pis (la orina) de color amarillo plido.  Tomar medicamentos recetados o de [employer].  Comer alimentos ricos en fibra. Entre ellos, frijoles, cereales integrales y frutas y verduras frescas.  Limitar los alimentos con alto contenido de grasa y Location manager. Estos incluyen alimentos fritos o dulces. Estilo de vida  No beba alcohol.  No consuma ningn producto que contenga nicotina o tabaco, como cigarrillos, cigarrillos electrnicos y tabaco de Higher education careers adviser. Si necesita ayuda para dejar de fumar, consulte al mdico.  Duerma como mnimo 8horas todas las noches.  Limite el estrs y [place]. Indicaciones generales  Lleve un registro diario para Neurosurgeon lo que Financial trader. Registre, por ejemplo, lo siguiente: ? Lo que usted come y bebe. ? El tiempo que duerme. ? Algn cambio en lo que come o bebe. ? Algn cambio en sus medicamentos.  Si tiene una cefalea migraosa: ? Evite los factores que [employer] sntomas, como las luces brillantes. ? Resulta til acostarse en una habitacin oscura y silenciosa. ? No conduzca vehculos ni opere maquinaria pesada. ? Pregntele al mdico qu actividades son seguras para usted.  Concurra a todas las visitas de seguimiento como se lo haya indicado el mdico. Esto es importante.      Comunquese con un mdico si:  Tiene una cefalea migraosa que es diferente o peor que otras que ha tenido.  Tiene ms de [address] de cefalea por mes. Solicite ayuda inmediatamente si:  La cefalea migraosa [employer].  La cefalea migraosa dura ms de 72 horas.  Tiene fiebre.  Presenta rigidez en  el cuello.  Tiene dificultad para ver.  Siente debilidad en los msculos o que no puede controlarlos.  Comienza a perder el equilibrio continuamente.  Comienza a tener dificultad para caminar.  Pierde el conocimiento (se desmaya).  Tiene una convulsin. Resumen  [place] cefalea migraosa es un dolor muy intenso y punzante en uno o ambos lados de la cabeza. Estos dolores de [place] tambin pueden causar otros sntomas.  Esta afeccin puede tratarse con medicamentos y cambios en el estilo de vida.  Lleve un registro diario para Neurosurgeon lo que Financial trader.  Comunquese con un mdico si tiene una cefalea migraosa que es diferente o peor que otras que ha tenido.  Comunquese con el mdico si tiene ms de 15 das de cefalea en un mes. Esta informacin no tiene Marine scientist el consejo del mdico. Asegrese de hacerle al mdico cualquier pregunta que tenga. Document Revised: 10/12/2018 Document Reviewed: 10/12/2018 Elsevier Patient Education  2021 [employer].

## 2020-10-07 NOTE — Progress Notes (Signed)
Desiree Krueger 50 y.o.   Chief Complaint  Patient presents with  . Headache    Per patient for 3-4 months with hearing loss, nausea and dizziness. Patient states she takes medicine Excedrin it does not help.    HISTORY OF PRESENT ILLNESS: This is a 50 y.o. female with history of migraine headaches complaining of increased frequency and severity of episodes over the past several months.  Headaches are now unexpected without aura and not responding to Excedrin as they usually do.  Associated with dizzy spells.  Denies head injury.  Denies visual problems.  Mild nausea but no vomiting.  Denies any other associated symptoms. 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 No other complaints or medical concerns today Fully vaccinated against COVID.  HPI   Prior to Admission medications   Not on File    No Known Allergies  Patient Active Problem List   Diagnosis Date Noted  . Anemia due to chronic blood loss 03/16/2016  . 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 DUB (dysfunctional uterine bleeding) 02/22/2016    Past Medical History:  Diagnosis Date  . Anemia   . Constipation   . Depression   . Dysrhythmia   . GERD (gastroesophageal reflux disease)   . Headache    MIGRAINES  . Low blood pressure   . Lower back pain   . Thyroid disease   . Varicose vein of leg    BILATERAL    Past Surgical History:  Procedure Laterality Date  . DILATATION & CURETTAGE/HYSTEROSCOPY WITH MYOSURE N/A 01/31/2017   Procedure: DILATATION & CURETTAGE/HYSTEROSCOPY WITH MYOSURE;  Surgeon: Terrance Mass, MD;  Location: [facility];  Service: Gynecology;  Laterality: N/A;  . DILATATION & CURETTAGE/HYSTEROSCOPY WITH MYOSURE N/A 09/29/2017   Procedure: DILATATION & CURETTAGE/HYSTEROSCOPY WITH MYOSURE RESECTION OF SUBMUCOSAL MYOMA;  Surgeon: Princess Bruins, MD;  Location: [facility];  Service: Gynecology;  Laterality: N/A;  request to  follow 2nd case around 10am in [place] block time Requests one hour OR time  . HYSTEROSCOPY WITH NOVASURE N/A 09/29/2017   Procedure: HYSTEROSCOPY WITH NOVASURE ENDOMETRIAL ABLATION;  Surgeon: Princess Bruins, MD;  Location: [facility];  Service: Gynecology;  Laterality: N/A;  . TUBAL LIGATION      Social History   Socioeconomic History  . Marital status: Single    Spouse name: Not on file  . Number of children: Not on file  . Years of education: Not on file  . Highest education level: Not on file  Occupational History  . Not on file  Tobacco Use  . Smoking status: Former Smoker    Years: 6.00    Types: Cigarettes  . Smokeless tobacco: Never Used  Vaping Use  . Vaping Use: Never used  Substance and Sexual Activity  . Alcohol use: Yes    Alcohol/week: 0.0 standard drinks    Comment: OCC  . Drug use: No  . Sexual activity: Not Currently  Other Topics Concern  . Not on file  Social History Narrative  . Not on file   Social Determinants of Health   Financial Resource Strain: Not on file  Food Insecurity: Not on file  Transportation Needs: Not on file  Physical Activity: Not on file  Stress: Not on file  Social Connections: Not on file  Intimate Partner Violence: Not on file    Family History  Problem Relation Age of Onset  . Cancer Maternal Uncle  COLON  . Hypertension Father   . Colon cancer Neg Hx   . Esophageal cancer Neg Hx   . Rectal cancer Neg Hx   . Stomach cancer Neg Hx      Review of Systems  Constitutional: Negative.  Negative for chills and fever.  HENT: Positive for hearing loss. Negative for congestion and sore throat.   Respiratory: Negative.  Negative for cough and shortness of breath.   Cardiovascular: Negative.  Negative for chest pain and palpitations.  Gastrointestinal: Positive for nausea. Negative for abdominal pain, blood in stool, diarrhea and vomiting.  Skin: Negative.  Negative for rash.   Neurological: Positive for dizziness and headaches.  All other systems reviewed and are negative.  Today's Vitals   10/07/20 1548  BP: 104/66  Pulse: 86  Resp: 16  Temp: 98.4 F (36.9 C)  TempSrc: Temporal  SpO2: 96%  Weight: 135 lb (61.2 kg)  Height: _0  (1.6 m)   Body mass index is 23.91 kg/m. Wt Readings from Last 3 Encounters:  10/07/20 135 lb (61.2 kg)  02/04/20 129 lb 12.8 oz (58.9 kg)  05/23/18 125 lb 12.8 oz (57.1 kg)     Physical Exam Vitals reviewed.  Constitutional:      Appearance: Normal appearance.  HENT:     Head: Normocephalic and atraumatic.     Right Ear: There is impacted cerumen.     Left Ear: There is impacted cerumen.     Nose: Nose normal.  Eyes:     Extraocular Movements: Extraocular movements intact.     Conjunctiva/sclera: Conjunctivae normal.     Pupils: Pupils are equal, round, and reactive to light.  Neck:     Vascular: No carotid bruit.  Cardiovascular:     Rate and Rhythm: Normal rate and regular rhythm.     Pulses: Normal pulses.     Heart sounds: Normal heart sounds.  Pulmonary:     Effort: Pulmonary effort is normal.     Breath sounds: Normal breath sounds.  Musculoskeletal:        General: Normal range of motion.     Cervical back: Normal range of motion and neck supple. No tenderness.  Lymphadenopathy:     Cervical: No cervical adenopathy.  Skin:    General: Skin is warm and dry.     Capillary Refill: Capillary refill takes less than 2 seconds.  Neurological:     General: No focal deficit present.     Mental Status: She is alert and oriented to person, place, and time.     Cranial Nerves: No cranial nerve deficit.     Sensory: No sensory deficit.     Motor: No weakness.     Coordination: Coordination normal.     Gait: Gait normal.  Psychiatric:        Mood and Affect: Mood normal.        Behavior: Behavior normal.      ASSESSMENT & PLAN: Britta was seen today for headache.  Diagnoses and all orders for this  visit:  Other migraine without status migrainosus, intractable -     CMP14+EGFR -     Hemoglobin A1c -     CBC with Differential/Platelet -     TSH -     POCT urinalysis dipstick -     SUMAtriptan (IMITREX) 50 MG tablet; Take 50 mg at the onset of headache; may repeat every 2 hours but no more than 200 mg/24 hours. -     Ambulatory referral  to Neurology    Patient Instructions       If you have lab work done today you will be contacted with your lab results within the next 2 weeks.  If you have not heard from [place] then please contact us. The fastest way to get your results is to register for My Chart.   IF you received an x-ray today, you will receive an invoice from [facility]. Please contact [facility] at [phone] with questions or concerns regarding your invoice.   IF you received labwork today, you will receive an invoice from [facility]. Please contact [facility] at [phone] with questions or concerns regarding your invoice.   Our billing staff will not be able to assist you with questions regarding bills from these companies.  You will be contacted with the lab results as soon as they are available. The fastest way to get your results is to activate your My Chart account. Instructions are located on the last page of this paperwork. If you have not heard from [place] regarding the results in 2 weeks, please contact this office.     Cefalea migraosa Migraine Headache Una cefalea migraosa es un dolor muy intenso y punzante en uno o ambos lados de la cabeza. Este tipo de dolor de cabeza tambin puede causar otros sntomas. Puede durar desde 4horas hasta 3das. Hable con su mdico sobre las cosas que pueden causar (desencadenar) esta afeccin. Cules son las causas? Se desconoce la causa exacta de esta afeccin. Esta afeccin puede desencadenarse o ser causada por lo siguiente:  Consumo de alcohol.  Consumo de cigarrillos.  Tomar medicamentos como por  ejemplo: ? Medicamentos para Best boy torcico (nitroglicerina). ? Anticonceptivos orales. ? Estrgeno. ? Algunos medicamentos para la presin arterial.  Comer o beber ciertos productos.  Hacer actividad fsica. Otros factores que pueden provocar cefalea migraosa son los siguientes:  Tener el perodo menstrual.  Glennis Brink.  Hambre.  Estrs.  No dormir lo suficiente o dormir demasiado.  Cambios climticos.  Cansancio (fatiga). Qu incrementa el riesgo?  Tener entre 25 y 31aos de edad.  Ser mujer.  Tener antecedentes familiares de cefalea migraosa.  Ser de Science writer.  Tener depresin o ansiedad.  Tener mucho sobrepeso. Cules son los signos o los sntomas?  Un dolor punzante. Este dolor puede [employer] siguientes caractersticas: ? Audiological scientist en cualquier regin de la cabeza, tanto de un lado como de [place]. ? Puede dificultar las actividades cotidianas. ? Puede empeorar con la actividad fsica. ? Puede empeorar con las luces brillantes o los ruidos fuertes.  Otros sntomas pueden incluir: ? Ganas de vomitar (nuseas). ? Vmitos. ? Mareos. ? Sensibilidad a las luces brillantes, los ruidos fuertes o [employer].  Antes de tener una cefalea migraosa, puede recibir seales de advertencia (aura). Un aura puede incluir: ? Ver luces intermitentes o tener puntos ciegos. ? Ver puntos brillantes, halos o lneas en zigzag. ? Tener una visin en tnel o visin borrosa. ? Sentir entumecimiento u hormigueo. ? Tener dificultad para hablar. ? Tener msculos dbiles.  Algunas personas tienen sntomas despus de una cefalea migraosa (fase posdromal), como los siguientes: ? Cansancio. ? Dificultad para pensar (concentrarse). Cmo se trata?  Tomar medicamentos para: ? Best boy. ? Aliviar la sensacin de Higher education careers adviser. ? Prevenir las Psychologist, occupational.  El tratamiento tambin puede incluir lo siguiente: ? Tomar sesiones de  acupuntura. ? Evitar los alimentos que provocan las cefaleas migraosas. ? Aprender Orland Jarred de controlar las funciones corporales (biorretroalimentacin). ?  Terapia para ayudarlo a Civil engineer, contracting y Therapist, nutritional con los pensamientos negativos (terapia cognitivo conductual). Siga estas instrucciones en su casa: Medicamentos  [employer] de venta libre y los recetados solamente como se lo haya indicado el mdico.  Consulte a su mdico si el medicamento que le recetaron: ? Hace que sea necesario que evite conducir o usar maquinaria pesada. ? Puede causarle dificultad para defecar (estreimiento). Es posible que deba tomar estas medidas para prevenir o tratar los problemas para defecar:  Electronics engineer suficiente lquido para Contractor pis (la orina) de color amarillo plido.  Tomar medicamentos recetados o de [employer].  Comer alimentos ricos en fibra. Entre ellos, frijoles, cereales integrales y frutas y verduras frescas.  Limitar los alimentos con alto contenido de grasa y Location manager. Estos incluyen alimentos fritos o dulces. Estilo de vida  No beba alcohol.  No consuma ningn producto que contenga nicotina o tabaco, como cigarrillos, cigarrillos electrnicos y tabaco de Higher education careers adviser. Si necesita ayuda para dejar de fumar, consulte al mdico.  Duerma como mnimo 8horas todas las noches.  Limite el estrs y [place]. Indicaciones generales  Lleve un registro diario para Neurosurgeon lo que Financial trader. Registre, por ejemplo, lo siguiente: ? Lo que usted come y bebe. ? El tiempo que duerme. ? Algn cambio en lo que come o bebe. ? Algn cambio en sus medicamentos.  Si tiene una cefalea migraosa: ? Evite los factores que [employer] sntomas, como las luces brillantes. ? Resulta til acostarse en una habitacin oscura y silenciosa. ? No conduzca vehculos ni opere maquinaria pesada. ? Pregntele al mdico qu actividades son seguras para usted.  Concurra a todas las  visitas de seguimiento como se lo haya indicado el mdico. Esto es importante.      Comunquese con un mdico si:  Tiene una cefalea migraosa que es diferente o peor que otras que ha tenido.  Tiene ms de [address] de cefalea por mes. Solicite ayuda inmediatamente si:  La cefalea migraosa [employer].  La cefalea migraosa dura ms de 72 horas.  Tiene fiebre.  Presenta rigidez en el cuello.  Tiene dificultad para ver.  Siente debilidad en los msculos o que no puede controlarlos.  Comienza a perder el equilibrio continuamente.  Comienza a tener dificultad para caminar.  Pierde el conocimiento (se desmaya).  Tiene una convulsin. Resumen  [place] cefalea migraosa es un dolor muy intenso y punzante en uno o ambos lados de la cabeza. Estos dolores de [place] tambin pueden causar otros sntomas.  Esta afeccin puede tratarse con medicamentos y cambios en el estilo de vida.  Lleve un registro diario para Neurosurgeon lo que Financial trader.  Comunquese con un mdico si tiene una cefalea migraosa que es diferente o peor que otras que ha tenido.  Comunquese con el mdico si tiene ms de 15 das de cefalea en un mes. Esta informacin no tiene Marine scientist el consejo del mdico. Asegrese de hacerle al mdico cualquier pregunta que tenga. Document Revised: 10/12/2018 Document Reviewed: 10/12/2018 Elsevier Patient Education  2021 Elsevier Inc.       Agustina Caroli, MD [facility]

## 2020-10-08 LAB — CMP14+EGFR
ALT: 36 IU/L — ABNORMAL HIGH (ref 0–32)
AST: 31 IU/L (ref 0–40)
Albumin/Globulin Ratio: 2 (ref 1.2–2.2)
Albumin: 4.5 g/dL (ref 3.8–4.8)
Alkaline Phosphatase: 79 IU/L (ref 44–121)
BUN/Creatinine Ratio: 17 (ref 9–23)
BUN: 16 mg/dL (ref 6–24)
Bilirubin Total: <0.2 mg/dL (ref 0.0–1.2)
CO2: 26 mmol/L (ref 20–29)
Calcium: 9.6 mg/dL (ref 8.7–10.2)
Chloride: 97 mmol/L (ref 96–106)
Creatinine, Ser: 0.92 mg/dL (ref 0.57–1.00)
GFR calc Af Amer: 85 mL/min/{1.73_m2} (ref >59)
GFR calc non Af Amer: 73 mL/min/{1.73_m2} (ref >59)
Globulin, Total: 2.3 g/dL (ref 1.5–4.5)
Glucose: 85 mg/dL (ref 65–99)
Potassium: 3.8 mmol/L (ref 3.5–5.2)
Sodium: 138 mmol/L (ref 134–144)
Total Protein: 6.8 g/dL (ref 6.0–8.5)

## 2020-10-08 LAB — CBC WITH DIFFERENTIAL/PLATELET
Basophils Absolute: 0 x10E3/uL (ref 0.0–0.2)
Basos: 1 %
EOS (ABSOLUTE): 0.1 x10E3/uL (ref 0.0–0.4)
Eos: 2 %
Hematocrit: 40.7 % (ref 34.0–46.6)
Hemoglobin: 13.8 g/dL (ref 11.1–15.9)
Immature Grans (Abs): 0 x10E3/uL (ref 0.0–0.1)
Immature Granulocytes: 0 %
Lymphocytes Absolute: 1.9 x10E3/uL (ref 0.7–3.1)
Lymphs: 35 %
MCH: 31.9 pg (ref 26.6–33.0)
MCHC: 33.9 g/dL (ref 31.5–35.7)
MCV: 94 fL (ref 79–97)
Monocytes Absolute: 0.4 x10E3/uL (ref 0.1–0.9)
Monocytes: 8 %
Neutrophils Absolute: 2.9 x10E3/uL (ref 1.4–7.0)
Neutrophils: 54 %
Platelets: 222 x10E3/uL (ref 150–450)
RBC: 4.33 x10E6/uL (ref 3.77–5.28)
RDW: 12.3 % (ref 11.7–15.4)
WBC: 5.4 x10E3/uL (ref 3.4–10.8)

## 2020-10-08 LAB — HEMOGLOBIN A1C
Est. average glucose Bld gHb Est-mCnc: 97 mg/dL
Hgb A1c MFr Bld: 5 % (ref 4.8–5.6)

## 2020-10-08 LAB — TSH: TSH: 2.55 u[IU]/mL (ref 0.450–4.500)

## 2020-10-12 ENCOUNTER — Telehealth: Payer: Self-pay | Admitting: *Deleted

## 2020-10-12 LAB — LIPID PANEL W/O CHOL/HDL RATIO
Cholesterol, Total: 198 mg/dL (ref 100–199)
HDL: 63 mg/dL (ref >39)
LDL Chol Calc (NIH): 117 mg/dL — ABNORMAL HIGH (ref 0–99)
Triglycerides: 104 mg/dL (ref 0–149)
VLDL Cholesterol Cal: 18 mg/dL (ref 5–40)

## 2020-10-12 LAB — SPECIMEN STATUS REPORT

## 2020-10-12 NOTE — Telephone Encounter (Signed)
Called patient left message voice mail health screening form is ready for pick up at check in.

## 2020-11-26 ENCOUNTER — Telehealth: Payer: Self-pay | Admitting: Emergency Medicine

## 2020-11-26 NOTE — Telephone Encounter (Signed)
1.Medication Requested: SUMAtriptan (IMITREX) 50 MG tablet    2. Pharmacy (Name, Street, [facility]): [place], [place]  3. On Med List: yes   4. Last Visit with PCP: 10-07-20  5. Next visit date with PCP: n/a   Patient said that she is out of medication and that her pharmacy will be closed this weekend.    Agent: Please be advised that RX refills may take up to 3 business days. We ask that you follow-up with your pharmacy.

## 2020-11-30 ENCOUNTER — Other Ambulatory Visit: Payer: Self-pay

## 2020-11-30 DIAGNOSIS — G43819 Other migraine, intractable, without status migrainosus: Secondary | ICD-10-CM

## 2020-11-30 MED ORDER — SUMATRIPTAN SUCCINATE 50 MG PO TABS: ORAL_TABLET | ORAL | 1 refills | Status: DC

## 2020-11-30 NOTE — Telephone Encounter (Signed)
Patient is requesting a refill of the following medications: Requested Prescriptions   Pending Prescriptions Disp Refills  . SUMAtriptan (IMITREX) 50 MG tablet 10 tablet 1    Sig: Take 50 mg at the onset of headache; may repeat every 2 hours but no more than 200 mg/24 hours.    Date of patient request: 11/26/20  Last office visit: 10/07/20  Date of last refill: 10/07/20  Last refill amount: 10 Tab,1 refill  Follow up time period per chart: n/a

## 2020-12-24 ENCOUNTER — Encounter: Payer: Self-pay | Admitting: Neurology

## 2020-12-24 ENCOUNTER — Ambulatory Visit (INDEPENDENT_AMBULATORY_CARE_PROVIDER_SITE_OTHER): Payer: 59 | Admitting: Neurology

## 2020-12-24 ENCOUNTER — Other Ambulatory Visit: Payer: Self-pay

## 2020-12-24 VITALS — BP 101/59 | HR 75 | Ht 63.0 in | Wt 137.5 lb

## 2020-12-24 DIAGNOSIS — G4452 New daily persistent headache (NDPH): Secondary | ICD-10-CM | POA: Insufficient documentation

## 2020-12-24 DIAGNOSIS — G43709 Chronic migraine without aura, not intractable, without status migrainosus: Secondary | ICD-10-CM | POA: Diagnosis not present

## 2020-12-24 MED ORDER — RIZATRIPTAN BENZOATE 10 MG PO TBDP: 10.0000 mg | ORAL_TABLET | ORAL | 6 refills | Status: DC | PRN

## 2020-12-24 MED ORDER — ONDANSETRON 4 MG PO TBDP: 4.0000 mg | ORAL_TABLET | Freq: Three times a day (TID) | ORAL | 6 refills | Status: DC | PRN

## 2020-12-24 MED ORDER — NORTRIPTYLINE HCL 10 MG PO CAPS: 20.0000 mg | ORAL_CAPSULE | Freq: Every day | ORAL | 11 refills | Status: DC

## 2020-12-24 NOTE — Progress Notes (Signed)
Chief Complaint  Patient presents with  . New Patient (Initial Visit)    She is here with an interpreter from [facility]. Reports having at least one severe headache weekly that can last up to four days. She has not had good relief with sumatriptan. She has never been on any preventive medications.      ASSESSMENT AND PLAN  Desiree Krueger is a 50 y.o. female   Chronic migraine headaches  She reported change of her headache pattern, desired further evaluation, proceed with MRI of the brain without contrast  Likely a component of medication overuse,  Start preventive medication nortriptyline 10 mg titrating to 20 mg every night  Suboptimal response to Imitrex 50 mg, will try Maxalt 10 mg dissolvable, may combine it with Zofran, Aleve for severe prolonged headaches  Return to clinic with Desiree Krueger in 3 months   DIAGNOSTIC DATA (LABS, IMAGING, TESTING) - I reviewed patient records, labs, notes, testing and imaging myself where available.   HISTORICAL  Desiree Krueger is a 50 year old female, seen in request by her primary care physician Dr.   Mitchel Honour, [facility], for evaluation of frequent headaches, initial evaluation was on Dec 24, 2020   I reviewed and summarized the referring note. PMHx.  She reported a long history of chronic migraine headaches, started in her 16s, previous headache was very severe, over the years, she was able to identify triggers, weather change, stress, strong smells, bright light, excessive coffee intake, spicy food, cheese, red wine  She has made major changes in her diet, with some improvement of her headache, but she still has 4 headaches days in a week, she described lateralized, sometimes holoacranial pressure headaches, evolving to severe pounding headache with light noise smell sensitivity, lasting for hours  For a while, she was using frequent Excedrin Migraine almost daily basis, in February 2022, she was giving prescription of Imitrex 50 mg  as needed, she used up her monthly supply of 9 tablets, sometimes has to buy extra, and laid back on her over-the-counter medication  She never tried preventive medication in the past, her brother also suffered migraine  She denies visual loss, father passed away with Parkinson's disease, mother suffered dementia  With her frequent headaches, also complains of emotional outburst, her mood switch quickly  PHYSICAL EXAM:   Vitals:   12/24/20 1522  BP: (!) 101/59  Pulse: 75  Weight: 137 lb 8 oz (62.4 kg)  Height: 5\' 3"  (1.6 m)   Not recorded     Body mass index is 24.36 kg/m.  PHYSICAL EXAMNIATION:  Gen: NAD, conversant, well nourised, well groomed                     Cardiovascular: Regular rate rhythm, no peripheral edema, warm, nontender. Eyes: Conjunctivae clear without exudates or hemorrhage Neck: Supple, no carotid bruits. Pulmonary: Clear to auscultation bilaterally   NEUROLOGICAL EXAM:  MENTAL STATUS: Speech:    Speech is normal; fluent and spontaneous with normal comprehension.  Cognition:     Orientation to time, place and person     Normal recent and remote memory     Normal Attention span and concentration     Normal Language, naming, repeating,spontaneous speech     Fund of knowledge   CRANIAL NERVES: CN II: Visual fields are full to confrontation. Pupils are round equal and briskly reactive to light. CN III, IV, VI: extraocular movement are normal. No ptosis. CN V: Facial sensation is intact to light touch CN  VII: Face is symmetric with normal eye closure  CN VIII: Hearing is normal to causal conversation. CN IX, X: Phonation is normal. CN XI: Head turning and shoulder shrug are intact  MOTOR: There is no pronator drift of out-stretched arms. Muscle bulk and tone are normal. Muscle strength is normal.  REFLEXES: Reflexes are 2+ and symmetric at the biceps, triceps, knees, and ankles. Plantar responses are flexor.  SENSORY: Intact to light touch,  pinprick and vibratory sensation are intact in fingers and toes.  COORDINATION: There is no trunk or limb dysmetria noted.  GAIT/STANCE: Posture is normal. Gait is steady with normal steps, base, arm swing, and turning. Heel and toe walking are normal. Tandem gait is normal.  Romberg is absent.  REVIEW OF SYSTEMS:  Full 14 system review of systems performed and notable only for as above All other review of systems were negative.   ALLERGIES: No Known Allergies  HOME MEDICATIONS: Current Outpatient Medications  Medication Sig Dispense Refill  . aspirin-acetaminophen-caffeine (EXCEDRIN MIGRAINE) 250-250-65 MG tablet Take by mouth every 6 (six) hours as needed for headache.    . Calcium Carb-Cholecalciferol (CALCIUM + D3 PO) Take by mouth daily.    . Ferrous Sulfate (IRON PO) Take by mouth daily.    . SUMAtriptan (IMITREX) 50 MG tablet Take 50 mg at the onset of headache; may repeat every 2 hours but no more than 200 mg/24 hours. 10 tablet 1   No current facility-administered medications for this visit.    PAST MEDICAL HISTORY: Past Medical History:  Diagnosis Date  . Anemia   . Constipation   . Depression   . Dysrhythmia   . GERD (gastroesophageal reflux disease)   . Headache    MIGRAINES  . Low blood pressure   . Lower back pain   . Thyroid disease   . Varicose vein of leg    BILATERAL    PAST SURGICAL HISTORY: Past Surgical History:  Procedure Laterality Date  . DILATATION & CURETTAGE/HYSTEROSCOPY WITH MYOSURE N/A 01/31/2017   Procedure: DILATATION & CURETTAGE/HYSTEROSCOPY WITH MYOSURE;  Surgeon: Terrance Mass, MD;  Location: [facility];  Service: Gynecology;  Laterality: N/A;  . DILATATION & CURETTAGE/HYSTEROSCOPY WITH MYOSURE N/A 09/29/2017   Procedure: DILATATION & CURETTAGE/HYSTEROSCOPY WITH MYOSURE RESECTION OF SUBMUCOSAL MYOMA;  Surgeon: Princess Bruins, MD;  Location: [facility];  Service: Gynecology;  Laterality: N/A;  request to follow 2nd  case around 10am in [place] block time Requests one hour OR time  . HYSTEROSCOPY WITH NOVASURE N/A 09/29/2017   Procedure: HYSTEROSCOPY WITH NOVASURE ENDOMETRIAL ABLATION;  Surgeon: Princess Bruins, MD;  Location: [facility];  Service: Gynecology;  Laterality: N/A;  . TUBAL LIGATION      FAMILY HISTORY: Family History  Problem Relation Age of Onset  . Cancer Maternal Uncle        COLON  . Parkinson's disease Father   . Varicose Veins Mother   . Colon cancer Neg Hx   . Esophageal cancer Neg Hx   . Rectal cancer Neg Hx   . Stomach cancer Neg Hx     SOCIAL HISTORY: Social History   Socioeconomic History  . Marital status: Single    Spouse name: Not on file  . Number of children: 2  . Years of education: two years college  . Highest education level: Not on file  Occupational History  . Occupation: Proofreader  Tobacco Use  . Smoking status: Former Smoker    Years: 6.00  Types: Cigarettes  . Smokeless tobacco: Never Used  Vaping Use  . Vaping Use: Never used  Substance and Sexual Activity  . Alcohol use: Yes    Alcohol/week: 0.0 standard drinks    Comment: OCC  . Drug use: Never  . Sexual activity: Not Currently  Other Topics Concern  . Not on file  Social History Narrative   Lives at home with her daughter.   Right-handed.   Caffeine use: 3 cups per day.   Social Determinants of Health   Financial Resource Strain: Not on file  Food Insecurity: Not on file  Transportation Needs: Not on file  Physical Activity: Not on file  Stress: Not on file  Social Connections: Not on file  Intimate Partner Violence: Not on file      Marcial Pacas, M.D. Ph.D.  [facility] [address] [place], [place] [address] Ph: [phone] Fax: [phone]  CC:  Horald Pollen, MD [place],  [place] [id number]  Horald Pollen, MD

## 2020-12-30 ENCOUNTER — Telehealth: Payer: Self-pay | Admitting: Neurology

## 2020-12-30 NOTE — Telephone Encounter (Signed)
MRI Brain without contrast was sent to [facility]. They will obtain authorization if needed.

## 2021-01-03 ENCOUNTER — Other Ambulatory Visit: Payer: Self-pay

## 2021-01-03 ENCOUNTER — Ambulatory Visit
Admission: RE | Admit: 2021-01-03 | Discharge: 2021-01-03 | Disposition: A | Discharge status: Home / Self Care | Payer: Self-pay | Source: Ambulatory Visit | Attending: Neurology | Admitting: Neurology

## 2021-01-03 DIAGNOSIS — G43709 Chronic migraine without aura, not intractable, without status migrainosus: Secondary | ICD-10-CM

## 2021-01-03 DIAGNOSIS — G4452 New daily persistent headache (NDPH): Secondary | ICD-10-CM

## 2021-01-04 ENCOUNTER — Other Ambulatory Visit: Payer: Self-pay

## 2021-01-04 ENCOUNTER — Ambulatory Visit
Admission: RE | Admit: 2021-01-04 | Discharge: 2021-01-04 | Disposition: A | Discharge status: Home / Self Care | Payer: Managed Care, Other (non HMO) | Source: Ambulatory Visit | Attending: Neurology | Admitting: Neurology

## 2021-01-04 DIAGNOSIS — G43709 Chronic migraine without aura, not intractable, without status migrainosus: Secondary | ICD-10-CM

## 2021-01-05 ENCOUNTER — Telehealth: Payer: Self-pay | Admitting: Neurology

## 2021-01-05 ENCOUNTER — Encounter: Payer: Self-pay | Admitting: Neurology

## 2021-01-05 NOTE — Telephone Encounter (Signed)
Called  Patient, informed her the MRI of the brain showed no significant abnormalities.Patient verbalized understanding, appreciation.

## 2021-01-05 NOTE — Telephone Encounter (Signed)
  IMPRESSION: This MRI of the brain without contrast shows the following: 1.   Several T2/FLAIR hyperintense foci in the subcortical and deep white matter.  This is a nonspecific finding most likely represents either minimal chronic microvascular ischemic change or the sequela of migraine headache.  None of the foci appeared acute. 2.   No acute findings.   Please call patient, MRI of the brain showed no significant abnormalities,

## 2021-02-10 ENCOUNTER — Other Ambulatory Visit: Payer: Self-pay

## 2021-02-10 ENCOUNTER — Ambulatory Visit (INDEPENDENT_AMBULATORY_CARE_PROVIDER_SITE_OTHER): Payer: Managed Care, Other (non HMO) | Admitting: Emergency Medicine

## 2021-02-10 ENCOUNTER — Encounter: Payer: Self-pay | Admitting: Emergency Medicine

## 2021-02-10 VITALS — BP 90/60 | HR 94 | Temp 98.7°F | Ht 63.0 in | Wt 138.4 lb

## 2021-02-10 DIAGNOSIS — G8929 Other chronic pain: Secondary | ICD-10-CM | POA: Diagnosis not present

## 2021-02-10 DIAGNOSIS — Z1231 Encounter for screening mammogram for malignant neoplasm of breast: Secondary | ICD-10-CM

## 2021-02-10 DIAGNOSIS — H900 Conductive hearing loss, bilateral: Secondary | ICD-10-CM | POA: Diagnosis not present

## 2021-02-10 DIAGNOSIS — M545 Low back pain, unspecified: Secondary | ICD-10-CM

## 2021-02-10 NOTE — Progress Notes (Signed)
Desiree Krueger 50 y.o.   Chief Complaint  Patient presents with   Hearing Problem    Both for 3 months ago    HISTORY OF PRESENT ILLNESS: This is a 50 y.o. female complaining of bilateral hearing problem for the past 3 months. Has history of chronic bilateral ear impactions. Needs referral for mammogram. Also has history of chronic low back pain, seen by chiropractor who recommended orthopedic follow-up. Has history of chronic constipation.  Normal colonoscopy in 2019. No other complaints or medical concerns today.  HPI   Prior to Admission medications   Medication Sig Start Date End Date Taking? Authorizing Provider  aspirin-acetaminophen-caffeine (EXCEDRIN MIGRAINE) [phone] MG tablet Take by mouth every 6 (six) hours as needed for headache.   Yes [provider]  Calcium Carb-Cholecalciferol (CALCIUM + D3 PO) Take by mouth daily.   Yes [provider]  Ferrous Sulfate (IRON PO) Take by mouth daily.   Yes [provider]  nortriptyline (PAMELOR) 10 MG capsule Take 2 capsules (20 mg total) by mouth at bedtime. 12/24/20  Yes Marcial Pacas, MD  ondansetron (ZOFRAN ODT) 4 MG disintegrating tablet Take 1 tablet (4 mg total) by mouth every 8 (eight) hours as needed. 12/24/20  Yes Marcial Pacas, MD  rizatriptan (MAXALT-MLT) 10 MG disintegrating tablet Take 1 tablet (10 mg total) by mouth as needed. May repeat in 2 hours if needed 12/24/20  Yes Marcial Pacas, MD  SUMAtriptan (IMITREX) 50 MG tablet Take 50 mg at the onset of headache; may repeat every 2 hours but no more than 200 mg/24 hours. 11/30/20  Yes Horald Pollen, MD    No Known Allergies  Patient Active Problem List   Diagnosis Date Noted   Chronic migraine w/o aura w/o status migrainosus, not intractable 12/24/2020   Headache, new daily persistent (ND[facility]) 12/24/2020   Anemia due to chronic blood loss 03/16/2016   Submucous leiomyoma of uterus 03/16/2016   DUB (dysfunctional uterine bleeding)  02/22/2016    Past Medical History:  Diagnosis Date   Anemia    Constipation    Depression    Dysrhythmia    GERD (gastroesophageal reflux disease)    Headache    MIGRAINES   Low blood pressure    Lower back pain    Thyroid disease    Varicose vein of leg    BILATERAL    Past Surgical History:  Procedure Laterality Date   DILATATION & CURETTAGE/HYSTEROSCOPY WITH MYOSURE N/A 01/31/2017   Procedure: DILATATION & CURETTAGE/HYSTEROSCOPY WITH MYOSURE;  Surgeon: Terrance Mass, MD;  Location: [facility];  Service: Gynecology;  Laterality: N/A;   DILATATION & CURETTAGE/HYSTEROSCOPY WITH MYOSURE N/A 09/29/2017   Procedure: DILATATION & CURETTAGE/HYSTEROSCOPY WITH MYOSURE RESECTION OF SUBMUCOSAL MYOMA;  Surgeon: Princess Bruins, MD;  Location: [place];  Service: Gynecology;  Laterality: N/A;  request to follow 2nd case around 10am in [place] block time Requests one hour OR time   HYSTEROSCOPY WITH NOVASURE N/A 09/29/2017   Procedure: [place];  Surgeon: Princess Bruins, MD;  Location: [facility];  Service: Gynecology;  Laterality: N/A;   TUBAL LIGATION      Social History   Socioeconomic History   Marital status: Single    Spouse name: Not on file   Number of children: 2   Years of education: two years college   Highest education level: Not on file  Occupational History   Occupation: warehouse  Tobacco Use   Smoking status: Former  Years: 6.00    Pack years: 0.00    Types: Cigarettes   Smokeless tobacco: Never  Vaping Use   Vaping Use: Never used  Substance and Sexual Activity   Alcohol use: Yes    Alcohol/week: 0.0 standard drinks    Comment: OCC   Drug use: Never   Sexual activity: Not Currently  Other Topics Concern   Not on file  Social History Narrative   Lives at home with her daughter.   Right-handed.   Caffeine use: 3 cups per day.   Social Determinants of Health    Financial Resource Strain: Not on file  Food Insecurity: Not on file  Transportation Needs: Not on file  Physical Activity: Not on file  Stress: Not on file  Social Connections: Not on file  Intimate Partner Violence: Not on file    Family History  Problem Relation Age of Onset   Cancer Maternal Uncle        COLON   Parkinson's disease Father    Varicose Veins Mother    Colon cancer Neg Hx    Esophageal cancer Neg Hx    Rectal cancer Neg Hx    Stomach cancer Neg Hx      Review of Systems  Constitutional: Negative.  Negative for chills and fever.  HENT:  Positive for hearing loss. Negative for congestion, ear discharge and ear pain.   Respiratory: Negative.  Negative for cough and shortness of breath.   Cardiovascular: Negative.  Negative for chest pain and palpitations.  Gastrointestinal:  Negative for abdominal pain, diarrhea, nausea and vomiting.  Genitourinary: Negative.   Skin: Negative.  Negative for rash.  Neurological: Negative.  Negative for dizziness and headaches.  All other systems reviewed and are negative.   Physical Exam Vitals reviewed.  Constitutional:      Appearance: Normal appearance.  HENT:     Head: Normocephalic.     Right Ear: There is impacted cerumen.     Left Ear: There is impacted cerumen.  Eyes:     Extraocular Movements: Extraocular movements intact.     Pupils: Pupils are equal, round, and reactive to light.  Cardiovascular:     Rate and Rhythm: Normal rate and regular rhythm.  Pulmonary:     Effort: Pulmonary effort is normal.     Breath sounds: Normal breath sounds.  Musculoskeletal:        General: Normal range of motion.     Cervical back: Normal range of motion and neck supple.  Skin:    General: Skin is warm.  Neurological:     General: No focal deficit present.     Mental Status: She is alert and oriented to person, place, and time.  Psychiatric:        Mood and Affect: Mood normal.        Behavior: Behavior normal.      ASSESSMENT & PLAN: Conductive hearing loss, bilateral Bilateral cerumen impaction.  Needs ENT evaluation and hearing test after cerumen disimpaction.  Chronic bilateral low back pain without sciatica Chronic pain affecting quality of life.  Recently seen by chiropractor.  Advised to follow-up with orthopedist.  Will refer to sports medicine for evaluation and possible physical therapy. Elgene was seen today for hearing problem.  Diagnoses and all orders for this visit:  Conductive hearing loss, bilateral -     Ambulatory referral to ENT  Encounter for screening mammogram for malignant neoplasm of breast -     MM Digital Screening; Future  Chronic bilateral low back pain without sciatica -     Ambulatory referral to Sports Medicine  Patient Instructions  Prdida auditiva Hearing Loss La prdida auditiva es la prdida total o parcial de la capacidad de or. Puedeser transitoria o permanente, y ocurrir en uno o en ambos odos. Se necesita atencin mdica para tratar correctamente la prdida [place] y para evitar que la afeccin empeore. Es posible recuperar la audicin parcial o totalmente, en funcin de la causa de la prdida [place] y de su gravedad. Enalgunos casos la prdida auditiva es permanente. Cules son las causas? Las causas frecuentes de la prdida auditiva incluyen lo siguiente: Exceso de cerumen en el conducto auditivo externo. Infeccin del conducto auditivo externo o del odo medio. Lquido en el odo medio. Lesin en el odo o en la zona alrededor del odo. Un objeto atascado en el odo. Antecedentes de exposicin prolongada a ruidos fuertes, Programmer, systems. Las causas menos frecuentes de la prdida auditiva incluyen lo siguiente: Tumores en el odo. Infecciones bacterianas o virales, como meningitis. Un orificio en la membrana del tmpano (membrana del tmpano perforada). Problemas en el nervio auditivo que enva las seales entre el cerebro y el  odo. Ciertos medicamentos. Cules son los signos o los sntomas? Los sntomas de esta afeccin pueden incluir los siguientes: Dificultad para Product manager diferencia entre los sonidos. Dificultad para seguir una conversacin cuando hay ruido ambiental. Ausencia de respuesta a los ruidos del entorno. Esto puede ser ms notorio cuando no hay respuesta a los ruidos inesperados. Necesidad de subir el volumen del Development worker, community, la radio u otros dispositivos. Pitidos en el odo. Mareos. Cmo se diagnostica? Esta afeccin se diagnostica en funcin de lo siguiente: Un examen fsico. Una prueba de audicin Lorel Monaco). Un especialista en audicin (audilogo) realizar la [place]. Es posible que lo deriven a un especialista en garganta, Doran Durand y odo (otorrinolaringlogo). Cmo se trata? El tratamiento para la prdida de la audicin incluye lo siguiente: Extraccin del cerumen. Medicamentos para tratar o prevenir una infeccin (antibiticos). Medicamentos para reducir la inflamacin (corticoesteroides). Audfonos para la prdida [place] relacionada con el dao nervioso. Siga estas instrucciones en su casa: Si le recetaron un antibitico, tmelo como se lo haya indicado el mdico. No deje de tomar los antibiticos aunque comience a Sports administrator. Tome los medicamentos de venta libre y los recetados solamente como se lo haya indicado el mdico. Evite los ruidos fuertes. Retome sus actividades normales segn lo indicado por el mdico. Pregntele al mdico qu actividades son seguras para usted. Concurra a todas las visitas de seguimiento como se lo haya indicado el mdico. Esto es importante. Comunquese con un mdico si: Siente mareos. Tiene nuevos sntomas. Vomita o siente nuseas. Tiene fiebre. Solicite ayuda inmediatamente si: Percibe cambios repentinos en la visin. Tiene dolor intenso de odos. Aumenta su fatiga o debilidad. Tiene un dolor de cabeza intenso. Resumen La prdida  [place] es una disminucin de la capacidad de or sonidos a su alrededor. Puede ser transitoria o permanente. El tratamiento depender de la causa de la prdida [place]. Puede incluir la extraccin de cerumen, medicamentos o un audfono. Es posible recuperar la audicin parcial o totalmente, en funcin de la causa de la prdida [place] y de su gravedad. Concurra a todas las visitas de seguimiento como se lo haya indicado el mdico. Esto es importante. Esta informacin no tiene Marine scientist el consejo del mdico. Asegresede hacerle al mdico cualquier pregunta que tenga. Document Revised: 06/14/2018 Document Reviewed: 06/14/2018 Elsevier Patient Education  2022 [place], MD [facility]

## 2021-02-10 NOTE — Patient Instructions (Signed)
Prdida auditiva Hearing Loss La prdida auditiva es la prdida total o parcial de la capacidad de or. Puedeser transitoria o permanente, y ocurrir en uno o en ambos odos. Se necesita atencin mdica para tratar correctamente la prdida [place] y para evitar que la afeccin empeore. Es posible recuperar la audicin parcial o totalmente, en funcin de la causa de la prdida [place] y de su gravedad. Enalgunos casos la prdida auditiva es permanente. Cules son las causas? Las causas frecuentes de la prdida auditiva incluyen lo siguiente: Exceso de cerumen en el conducto auditivo externo. Infeccin del conducto auditivo externo o del odo medio. Lquido en el odo medio. Lesin en el odo o en la zona alrededor del odo. Un objeto atascado en el odo. Antecedentes de exposicin prolongada a ruidos fuertes, Programmer, systems. Las causas menos frecuentes de la prdida auditiva incluyen lo siguiente: Tumores en el odo. Infecciones bacterianas o virales, como meningitis. Un orificio en la membrana del tmpano (membrana del tmpano perforada). Problemas en el nervio auditivo que enva las seales entre el cerebro y el odo. Ciertos medicamentos. Cules son los signos o los sntomas? Los sntomas de esta afeccin pueden incluir los siguientes: Dificultad para Product manager diferencia entre los sonidos. Dificultad para seguir una conversacin cuando hay ruido ambiental. Ausencia de respuesta a los ruidos del entorno. Esto puede ser ms notorio cuando no hay respuesta a los ruidos inesperados. Necesidad de subir el volumen del Development worker, community, la radio u otros dispositivos. Pitidos en el odo. Mareos. Cmo se diagnostica? Esta afeccin se diagnostica en funcin de lo siguiente: Un examen fsico. Una prueba de audicin Lorel Monaco). Un especialista en audicin (audilogo) realizar la [place]. Es posible que lo deriven a un especialista en garganta, Doran Durand y odo (otorrinolaringlogo). Cmo se  trata? El tratamiento para la prdida de la audicin incluye lo siguiente: Extraccin del cerumen. Medicamentos para tratar o prevenir una infeccin (antibiticos). Medicamentos para reducir la inflamacin (corticoesteroides). Audfonos para la prdida [place] relacionada con el dao nervioso. Siga estas instrucciones en su casa: Si le recetaron un antibitico, tmelo como se lo haya indicado el mdico. No deje de tomar los antibiticos aunque comience a Sports administrator. Tome los medicamentos de venta libre y los recetados solamente como se lo haya indicado el mdico. Evite los ruidos fuertes. Retome sus actividades normales segn lo indicado por el mdico. Pregntele al mdico qu actividades son seguras para usted. Concurra a todas las visitas de seguimiento como se lo haya indicado el mdico. Esto es importante. Comunquese con un mdico si: Siente mareos. Tiene nuevos sntomas. Vomita o siente nuseas. Tiene fiebre. Solicite ayuda inmediatamente si: Percibe cambios repentinos en la visin. Tiene dolor intenso de odos. Aumenta su fatiga o debilidad. Tiene un dolor de cabeza intenso. Resumen La prdida [place] es una disminucin de la capacidad de or sonidos a su alrededor. Puede ser transitoria o permanente. El tratamiento depender de la causa de la prdida [place]. Puede incluir la extraccin de cerumen, medicamentos o un audfono. Es posible recuperar la audicin parcial o totalmente, en funcin de la causa de la prdida [place] y de su gravedad. Concurra a todas las visitas de seguimiento como se lo haya indicado el mdico. Esto es importante. Esta informacin no tiene Marine scientist el consejo del mdico. Asegresede hacerle al mdico cualquier pregunta que tenga. Document Revised: 06/14/2018 Document Reviewed: 06/14/2018 Elsevier Patient Education  2022 [employer].

## 2021-02-10 NOTE — Assessment & Plan Note (Signed)
Bilateral cerumen impaction.  Needs ENT evaluation and hearing test after cerumen disimpaction.

## 2021-02-10 NOTE — Assessment & Plan Note (Signed)
Chronic pain affecting quality of life.  Recently seen by chiropractor.  Advised to follow-up with orthopedist.  Will refer to sports medicine for evaluation and possible physical therapy.

## 2021-02-16 ENCOUNTER — Encounter: Payer: Self-pay | Admitting: Family Medicine

## 2021-02-23 ENCOUNTER — Other Ambulatory Visit: Payer: Self-pay

## 2021-02-23 ENCOUNTER — Ambulatory Visit (INDEPENDENT_AMBULATORY_CARE_PROVIDER_SITE_OTHER): Payer: Managed Care, Other (non HMO) | Admitting: Family Medicine

## 2021-02-23 ENCOUNTER — Ambulatory Visit (INDEPENDENT_AMBULATORY_CARE_PROVIDER_SITE_OTHER): Payer: Managed Care, Other (non HMO)

## 2021-02-23 VITALS — BP 106/78 | Ht 63.0 in | Wt 139.4 lb

## 2021-02-23 DIAGNOSIS — M545 Low back pain, unspecified: Secondary | ICD-10-CM

## 2021-02-23 DIAGNOSIS — M25579 Pain in unspecified ankle and joints of unspecified foot: Secondary | ICD-10-CM | POA: Diagnosis not present

## 2021-02-23 DIAGNOSIS — G8929 Other chronic pain: Secondary | ICD-10-CM | POA: Diagnosis not present

## 2021-02-23 DIAGNOSIS — M25552 Pain in left hip: Secondary | ICD-10-CM | POA: Diagnosis not present

## 2021-02-23 NOTE — Patient Instructions (Signed)
Thank you for coming in today.   Please get an Xray today before you leave   I've referred you to Physical Therapy.  Let us know if you don't hear from them in one week.   Recheck in 6 weeks  Regresse con 6 semanas con Dr Georgina Snell

## 2021-02-23 NOTE — Progress Notes (Signed)
I, Peterson Lombard, LAT, ATC acting as a scribe for Lynne Leader, MD.  Subjective:    I'm seeing this patient as a consultation for: Dr. Agustina Caroli. Note will be routed back to referring provider/PCP.  CC: Low back pain  HPI: Pt is a 50 y/o female c/o low back pain x 2 years. Pt lifts a lot of weight for her work. Pt locates pain to bilat low back, intermittently pain will go from R to L, w/ radiating symptoms to foot/ankle. When she is sitting sometimes pain will go into L groin and R foot.  Radiating pain: yes LE numbness/tingling: no LE weakness: no Aggravates: getting up from bed, bending forward, lifting heavy things Treatments tried: chiro, ortho, exercises, stretching  Past medical history, Surgical history, Family history, Social history, Allergies, and medications have been entered into the medical record, reviewed.   Review of Systems: No new headache, visual changes, nausea, vomiting, diarrhea, constipation, dizziness, abdominal pain, skin rash, fevers, chills, night sweats, weight loss, swollen lymph nodes, body aches, joint swelling, muscle aches, chest pain, shortness of breath, mood changes, visual or auditory hallucinations.   Objective:    Vitals:   02/23/21 1442  BP: 106/78   General: Well Developed, well nourished, and in no acute distress.  Neuro/Psych: Alert and oriented x3, extra-ocular muscles intact, able to move all 4 extremities, sensation grossly intact. Skin: Warm and dry, no rashes noted.  Respiratory: Not using accessory muscles, speaking in full sentences, trachea midline.  Cardiovascular: Pulses palpable, no extremity edema. Abdomen: Does not appear distended. MSK: L-spine: Nontender midline.  Tender palpation lumbar paraspinal musculature. Normal lumbar motion. Lower extremity strength is intact.  Left hip normal-appearing Nontender. Normal hip motion pain with flexion and rotation. Strength is intact.  Ankles bilaterally  normal-appearing nontender normal motion.  Slight laxity.  Intact strength.   Lab and Radiology Results  X-ray images L-spine and left hip obtained today personally and independently interpreted  L-spine: Mild scoliosis convex left.  DDD L5-S1.  Left hip: No significant degenerative changes.  No acute fractures.  Possible cam type FAI pattern mild.  Await formal radiology review  Impression and Recommendations:    Assessment and Plan: 50 y.o. female with axial back pain low portion of lumbar spine.  This is a chronic issue ongoing for years.  Pain thought to be primarily due to muscle dysfunction although she does have DDD on recent x-ray.  Plan to treat with physical therapy and reassess in about 6 weeks or so.  Additionally she has left groin/anterior hip pain.  Fortunately x-ray does not show severe hip arthritis although based on her pain pattern I am concerned that she has either femoral acetabular impingement or labrum tear.  It is possible that some of her pain could be due to hip flexor dysfunction therefore will refer to physical therapy as well.  If no benefit neck step would probably be MRI arthrogram  Additionally she has a little bit of ankle laxity which probably does explain some of her ankle pain.  Will refer to PT as well for this.  Recheck 6 weeks.  Visit conducted using Spanish interpreter on the phone.Marland Kitchen  PDMP not reviewed this encounter. Orders Placed This Encounter  Procedures   DG Lumbar Spine 2-3 Views    Standing Status:   Future    Number of Occurrences:   1    Standing Expiration Date:   02/23/2022    Order Specific Question:   Reason for Exam (SYMPTOM  OR DIAGNOSIS REQUIRED)    Answer:   low back pain    Order Specific Question:   Preferred imaging location?    Answer:   [facility]    Order Specific Question:   Is patient pregnant?    Answer:   No   DG HIP UNILAT WITH PELVIS 2-3 VIEWS LEFT    Standing Status:   Future    Number of  Occurrences:   1    Standing Expiration Date:   02/23/2022    Order Specific Question:   Reason for Exam (SYMPTOM  OR DIAGNOSIS REQUIRED)    Answer:   eval left hip pain    Order Specific Question:   Is patient pregnant?    Answer:   No    Order Specific Question:   Preferred imaging location?    Answer:   [facility]   Ambulatory referral to Physical Therapy    Referral Priority:   Routine    Referral Type:   Physical Medicine    Referral Reason:   Specialty Services Required    Requested Specialty:   Physical Therapy    Number of Visits Requested:   1   No orders of the defined types were placed in this encounter.   Discussed warning signs or symptoms. Please see discharge instructions. Patient expresses understanding.   The above documentation has been reviewed and is accurate and complete Lynne Leader, M.D.

## 2021-02-26 NOTE — Progress Notes (Signed)
Left hip x-ray looks normal to radiology.  No arthritis is visible.  If not better with physical therapy MRI arthrogram may be helpful.

## 2021-02-26 NOTE — Progress Notes (Signed)
X-ray lumbar spine shows a tiny bit of scoliosis.  No significant arthritis changes are present.  No fractures are visible.  Okay looking lumbar spine.

## 2021-03-02 ENCOUNTER — Other Ambulatory Visit: Payer: Self-pay

## 2021-03-02 ENCOUNTER — Ambulatory Visit
Admission: RE | Admit: 2021-03-02 | Discharge: 2021-03-02 | Disposition: A | Discharge status: Home / Self Care | Payer: Managed Care, Other (non HMO) | Source: Ambulatory Visit | Attending: Emergency Medicine | Admitting: Emergency Medicine

## 2021-03-02 DIAGNOSIS — Z1231 Encounter for screening mammogram for malignant neoplasm of breast: Secondary | ICD-10-CM

## 2021-03-09 ENCOUNTER — Ambulatory Visit: Payer: Managed Care, Other (non HMO) | Attending: Family Medicine

## 2021-03-09 ENCOUNTER — Ambulatory Visit: Payer: Managed Care, Other (non HMO)

## 2021-03-09 ENCOUNTER — Other Ambulatory Visit: Payer: Self-pay

## 2021-03-09 DIAGNOSIS — M25571 Pain in right ankle and joints of right foot: Secondary | ICD-10-CM | POA: Diagnosis present

## 2021-03-09 DIAGNOSIS — M25572 Pain in left ankle and joints of left foot: Secondary | ICD-10-CM | POA: Diagnosis present

## 2021-03-09 DIAGNOSIS — M545 Low back pain, unspecified: Secondary | ICD-10-CM | POA: Insufficient documentation

## 2021-03-09 DIAGNOSIS — M25552 Pain in left hip: Secondary | ICD-10-CM

## 2021-03-09 DIAGNOSIS — G8929 Other chronic pain: Secondary | ICD-10-CM | POA: Insufficient documentation

## 2021-03-09 DIAGNOSIS — M6281 Muscle weakness (generalized): Secondary | ICD-10-CM | POA: Diagnosis present

## 2021-03-09 NOTE — Therapy (Signed)
[place] [place], [place], [address] Phone: [phone]   Fax:  [phone]  Physical Therapy Evaluation  Patient Details  Name: Desiree Krueger MRN: [id number] Date of Birth: Feb 04, 1971 Referring Provider (PT): Georgina Snell   Encounter Date: 03/09/2021   PT End of Session - 03/09/21 1811     Visit Number 1    Date for PT Re-Evaluation 06/01/21    PT Start Time 1815    PT Stop Time 1853    PT Time Calculation (min) 38 min    Activity Tolerance Patient tolerated treatment well    Behavior During Therapy [facility] for tasks assessed/performed             Past Medical History:  Diagnosis Date   Anemia    Constipation    Depression    Dysrhythmia    GERD (gastroesophageal reflux disease)    Headache    MIGRAINES   Low blood pressure    Lower back pain    Thyroid disease    Varicose vein of leg    BILATERAL    Past Surgical History:  Procedure Laterality Date   DILATATION & CURETTAGE/HYSTEROSCOPY WITH MYOSURE N/A 01/31/2017   Procedure: DILATATION & CURETTAGE/HYSTEROSCOPY WITH MYOSURE;  Surgeon: Terrance Mass, MD;  Location: [facility];  Service: Gynecology;  Laterality: N/A;   DILATATION & CURETTAGE/HYSTEROSCOPY WITH MYOSURE N/A 09/29/2017   Procedure: DILATATION & CURETTAGE/HYSTEROSCOPY WITH MYOSURE RESECTION OF SUBMUCOSAL MYOMA;  Surgeon: Princess Bruins, MD;  Location: [facility];  Service: Gynecology;  Laterality: N/A;  request to follow 2nd case around 10am in [place] block time Requests one hour OR time   HYSTEROSCOPY WITH NOVASURE N/A 09/29/2017   Procedure: [place];  Surgeon: Princess Bruins, MD;  Location: [facility];  Service: Gynecology;  Laterality: N/A;   TUBAL LIGATION      There were no vitals filed for this visit.    Subjective Assessment - 03/09/21 1815     Subjective hx of chronic low back pain.  Reports pain has been coming on and off for years.  Per MD note "Pain thought to be primarily due to muscle dysfunction although she does have DDD on recent x-ray.     Additionally she has left groin/anterior hip pain.  Fortunately x-ray does not show severe hip arthritis although based on her pain pattern I am concerned that she has either femoral acetabular impingement or labrum tear". Reports she has to start stepping, transitions into standing with right leg because the left leg and back begin to hurt more. 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   Ankle pain has been going on about 9 years on and off.  Does note increased ankle pain with wlaking alot, does also report cold sometimes increases the pain. Reports by end of day she begins to limp.    Currently in Pain? Yes   L ankle 5-6/10               [facility] PT Assessment - 03/09/21 1809       Assessment   Medical Diagnosis M54.50,G89.29 (ICD-10-CM) - Chronic bilateral low back pain without sciatica  M25.552 (ICD-10-CM) - Left hip pain  M25.579 (ICD-10-CM) - Pain in joint involving ankle and foot, unspecified laterality    Referring Provider (PT) [place]  Screen   Has the patient fallen in the past 6 months No    Has the patient had a decrease in activity level because of a fear of falling?  No    Is the patient reluctant to leave their home because of a fear of falling?  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      Strength   Overall Strength Comments LLE 4-/5. RLE grossly 4/5.      Flexibility   Soft Tissue Assessment /Muscle Length --   grossly WNL                        Objective measurements completed on examination: See above findings.               PT Education - 03/09/21 1909     Education Details PT POC and initial HEP. Benefit of exercises, strength and mobility on supporting joints. Access Code: [email or web address] . Exercises with emphasis on quality over quantitiy and slow controlled motion.  Safe  Use of heat to decrease mms spasm/tightness as needed    Person(s) Educated Patient    Methods Explanation;Demonstration;Handout    Comprehension Verbalized understanding;Returned demonstration;Need further instruction              PT Short Term Goals - 03/09/21 1811       PT SHORT TERM GOAL #1   Title Independent with initial HEP    Time 2    Period Weeks    Status New    Target Date 03/23/21               PT Long Term Goals - 03/09/21 1811       PT LONG TERM GOAL #1   Title Independent with advanced HEP    Time 12    Period Weeks    Status New    Target Date 06/01/21      PT LONG TERM GOAL #2   Title Decreased pain by at least 50% overall    Time 12    Period Weeks    Target Date 06/01/21      PT LONG TERM GOAL #3   Title Pt to be able to lift/carry various weighted objects with minimal pain    Time 12    Period Weeks    Status New    Target Date 06/01/21      PT LONG TERM GOAL #4   Title Pt to report </= 2/10 back hip or ankle pain at end of day at work    Time 12    Period Weeks    Status New    Target Date 06/01/21      PT LONG TERM GOAL #5   Title BLE strength improved to at least 4+/5 with </= 2/10 pain    Time 12    Period Weeks    Status New    Target Date 06/01/21                    Plan - 03/09/21 1812     Clinical Impression Statement Pt  is a 50 yo female who presents with hx of chronic LBP, L hip pain, and B ankle pain (today L>R) that has been on and off for years. recently, pain gets worse the longer she is on her feet and causes her to limp. She  currently presents with decreased LLE flexibility, decreased core/BLE strength and stability and mms guarding. She will benefit from skilled PT to work on improving functional strength and mobility. She does have a high copay which she states will prevent her from coming in on a weekly basis so plan is to reinforce HEP and progress as tolerated in each visit to improve upon functional strenght/mobility and decrease pain. Pt VU and in agreement with this plan at this time.    Examination-Activity Limitations Locomotion Level;Carry;Stand;Lift    Examination-Participation Restrictions Occupation    Stability/Clinical Decision Making Evolving/Moderate complexity    Clinical Decision Making Moderate    Rehab Potential Good    PT Frequency Biweekly    PT Duration 12 weeks    PT Treatment/Interventions ADLs/Self Care Home Management;Cryotherapy;Electrical Stimulation;Iontophoresis '4mg'$ /ml Dexamethasone;Moist Heat;Neuromuscular re-education;Balance training;Therapeutic exercise;Therapeutic activities;Functional mobility training;Patient/family education;Manual techniques;Taping    PT Next Visit Plan reassess HEP and progress as tolerated. Core/LE strengthening, LLE flexibility, ankle stability. Assess SL balance and body mechanics and provide education as needed    PT Home Exercise Plan see pt instructions    Consulted and Agree with Plan of Care Patient             Patient will benefit from skilled therapeutic intervention in order to improve the following deficits and impairments:  Increased muscle spasms, Pain, Improper body mechanics, Impaired flexibility, Decreased strength, Decreased mobility, Decreased balance  Visit Diagnosis: Chronic low back pain, unspecified back pain laterality, unspecified whether sciatica present  Pain in left hip  Pain in left ankle and joints of left foot  Pain in right ankle and joints of right foot  Muscle weakness (generalized)     Problem List Patient  Active Problem List   Diagnosis Date Noted   Conductive hearing loss, bilateral 02/10/2021   Chronic bilateral low back pain without sciatica 02/10/2021   Chronic migraine w/o aura w/o status migrainosus, not intractable 12/24/2020   Headache, new daily persistent (NDPH) 12/24/2020   Anemia due to chronic blood loss 03/16/2016   Submucous leiomyoma of uterus 03/16/2016   DUB (dysfunctional uterine bleeding) 02/22/2016    Hall Busing, PT, DPT 03/09/2021, 7:12 PM  [place]. [place], [place], [address] Phone: [phone]   Fax:  [phone]  Name: Desiree Krueger MRN: [id number] Date of Birth: 1970/09/07

## 2021-03-09 NOTE — Patient Instructions (Signed)
Access Code: [email or web address] URL: [email or web address] Date: 03/09/2021 Prepared by: Sherlynn Stalls  Exercises Supine Hamstring Stretch with Strap - 1 x daily - 7 x weekly - 4 sets - 30 seconds hold Supine Bridge - 1 x daily - 7 x weekly - 2 sets - 10 reps Supine March - 1 x daily - 7 x weekly - 2 sets - 10 reps Standing Heel Raise - 1 x daily - 7 x weekly - 2 sets - 10 reps Seated Ankle Eversion with Resistance - 1 x daily - 7 x weekly - 2 sets - 10 reps

## 2021-03-22 ENCOUNTER — Ambulatory Visit: Payer: 59 | Admitting: Neurology

## 2021-03-22 ENCOUNTER — Other Ambulatory Visit: Payer: Self-pay

## 2021-03-22 ENCOUNTER — Ambulatory Visit (INDEPENDENT_AMBULATORY_CARE_PROVIDER_SITE_OTHER): Payer: Managed Care, Other (non HMO) | Admitting: Otolaryngology

## 2021-03-22 DIAGNOSIS — H6123 Impacted cerumen, bilateral: Secondary | ICD-10-CM | POA: Diagnosis not present

## 2021-03-22 DIAGNOSIS — H903 Sensorineural hearing loss, bilateral: Secondary | ICD-10-CM | POA: Diagnosis not present

## 2021-03-22 NOTE — Progress Notes (Signed)
HPI: Desiree Krueger is a 50 y.o. female who presents is referred by by her PCP for evaluation of hearing problems and wax in her ears.  She has had her ears flushed before but did not like this.  She sometimes has sensation of something in her ear especially on the left side.  She is referred here for hearing evaluation and to have the ears cleaned..  Past Medical History:  Diagnosis Date   Anemia    Constipation    Depression    Dysrhythmia    GERD (gastroesophageal reflux disease)    Headache    MIGRAINES   Low blood pressure    Lower back pain    Thyroid disease    Varicose vein of leg    BILATERAL   Past Surgical History:  Procedure Laterality Date   DILATATION & CURETTAGE/HYSTEROSCOPY WITH MYOSURE N/A 01/31/2017   Procedure: DILATATION & CURETTAGE/HYSTEROSCOPY WITH MYOSURE;  Surgeon: Terrance Mass, MD;  Location: [facility];  Service: Gynecology;  Laterality: N/A;   DILATATION & CURETTAGE/HYSTEROSCOPY WITH MYOSURE N/A 09/29/2017   Procedure: DILATATION & CURETTAGE/HYSTEROSCOPY WITH MYOSURE RESECTION OF SUBMUCOSAL MYOMA;  Surgeon: Princess Bruins, MD;  Location: [facility];  Service: Gynecology;  Laterality: N/A;  request to follow 2nd case around 10am in [place] block time Requests one hour OR time   HYSTEROSCOPY WITH NOVASURE N/A 09/29/2017   Procedure: [place];  Surgeon: Princess Bruins, MD;  Location: [facility];  Service: Gynecology;  Laterality: N/A;   TUBAL LIGATION     Social History   Socioeconomic History   Marital status: Single    Spouse name: Not on file   Number of children: 2   Years of education: two years college   Highest education level: Not on file  Occupational History   Occupation: warehouse  Tobacco Use   Smoking status: Former    Years: 6.00    Types: Cigarettes   Smokeless tobacco: Never  Vaping Use   Vaping Use: Never used  Substance and Sexual Activity    Alcohol use: Yes    Alcohol/week: 0.0 standard drinks    Comment: OCC   Drug use: Never   Sexual activity: Not Currently  Other Topics Concern   Not on file  Social History Narrative   Lives at home with her daughter.   Right-handed.   Caffeine use: 3 cups per day.   Social Determinants of Health   Financial Resource Strain: Not on file  Food Insecurity: Not on file  Transportation Needs: Not on file  Physical Activity: Not on file  Stress: Not on file  Social Connections: Not on file   Family History  Problem Relation Age of Onset   Cancer Maternal Uncle        COLON   Parkinson's disease Father    Varicose Veins Mother    Colon cancer Neg Hx    Esophageal cancer Neg Hx    Rectal cancer Neg Hx    Stomach cancer Neg Hx    No Known Allergies Prior to Admission medications   Medication Sig Start Date End Date Taking? Authorizing Provider  aspirin-acetaminophen-caffeine (EXCEDRIN MIGRAINE) [id number] MG tablet Take by mouth every 6 (six) hours as needed for headache.    [provider]  Calcium Carb-Cholecalciferol (CALCIUM + D3 PO) Take by mouth daily.    [provider]  Ferrous Sulfate (IRON PO) Take by mouth daily.    [provider]  nortriptyline (PAMELOR) 10 MG capsule Take 2 capsules (20 mg total) by mouth at bedtime. 12/24/20   Marcial Pacas, MD  ondansetron (ZOFRAN ODT) 4 MG disintegrating tablet Take 1 tablet (4 mg total) by mouth every 8 (eight) hours as needed. 12/24/20   Marcial Pacas, MD  rizatriptan (MAXALT-MLT) 10 MG disintegrating tablet Take 1 tablet (10 mg total) by mouth as needed. May repeat in 2 hours if needed 12/24/20   Marcial Pacas, MD  SUMAtriptan (IMITREX) 50 MG tablet Take 50 mg at the onset of headache; may repeat every 2 hours but no more than 200 mg/24 hours. 11/30/20   Horald Pollen, MD     Positive ROS: Otherwise negative  All other systems have been reviewed and were otherwise negative with the exception of those  mentioned in the HPI and as above.  Physical Exam: Constitutional: Alert, well-appearing, no acute distress Ears: External ears without lesions or tenderness.  She had a mod amount of wax in both ear canals that was cleaned with suction and forceps and curettes.  The TMs were clear bilaterally with good mobility on pneumatic otoscopy.  No middle ear space abnormality noted.  After cleaning the ears hearing screening with a tuning forks revealed a minor hearing loss with the 1024 tuning fork in both ears.  No conductive loss with AC > BC bilaterally.  She felt her hearing was better after cleaning the ears. Nasal: External nose without lesions. Septum with minimal deformity and mild rhinitis. Clear nasal passages otherwise with no signs of infection. Oral: Lips and gums without lesions. Tongue and palate mucosa without lesions. Posterior oropharynx clear. Neck: No palpable adenopathy or masses Respiratory: Breathing comfortably  Skin: No facial/neck lesions or rash noted.  Cerumen impaction removal  Date/Time: 03/22/2021 6:02 PM Performed by: Rozetta Nunnery, MD Authorized by: Rozetta Nunnery, MD   Consent:    Consent obtained:  Verbal   Consent given by:  Patient   Risks discussed:  Pain and bleeding Procedure details:    Location:  L ear and R ear   Procedure type: curette, suction and forceps   Post-procedure details:    Inspection:  TM intact and canal normal   Hearing quality:  Improved   Procedure completion:  Tolerated well, no immediate complications Comments:     Patient had a mod amount of wax in both ear canals that was cleaned with suction forceps and curettes.  TMs were clear bilaterally with good mobility on pneumatic otoscopy and no middle ear space abnormality noted.   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  Plan: After cleaning her ears she felt like she  heard better.  She will call back to get audiologic testing if she does not feel like she is hearing adequately at a later date.   Radene Journey, MD   CC:

## 2021-03-23 ENCOUNTER — Ambulatory Visit: Payer: Managed Care, Other (non HMO) | Admitting: Physical Therapy

## 2021-03-23 ENCOUNTER — Ambulatory Visit: Payer: 59 | Admitting: Neurology

## 2021-04-06 ENCOUNTER — Ambulatory Visit (INDEPENDENT_AMBULATORY_CARE_PROVIDER_SITE_OTHER): Payer: Managed Care, Other (non HMO) | Admitting: Emergency Medicine

## 2021-04-06 ENCOUNTER — Ambulatory Visit: Payer: Managed Care, Other (non HMO) | Admitting: Family Medicine

## 2021-04-06 ENCOUNTER — Encounter: Payer: Self-pay | Admitting: Emergency Medicine

## 2021-04-06 ENCOUNTER — Other Ambulatory Visit: Payer: Self-pay

## 2021-04-06 VITALS — BP 109/68 | HR 90 | Temp 98.2°F | Ht 63.0 in | Wt 139.0 lb

## 2021-04-06 DIAGNOSIS — B999 Unspecified infectious disease: Secondary | ICD-10-CM | POA: Diagnosis not present

## 2021-04-06 DIAGNOSIS — R300 Dysuria: Secondary | ICD-10-CM

## 2021-04-06 LAB — POCT URINALYSIS DIP (MANUAL ENTRY)
Bilirubin, UA: NEGATIVE
Glucose, UA: NEGATIVE mg/dL
Ketones, POC UA: NEGATIVE mg/dL
Leukocytes, UA: NEGATIVE
Nitrite, UA: NEGATIVE
Protein Ur, POC: NEGATIVE mg/dL
Spec Grav, UA: 1.01 (ref 1.010–1.025)
Urobilinogen, UA: 0.2 E.U./dL
pH, UA: 6.5 (ref 5.0–8.0)

## 2021-04-06 MED ORDER — CEFUROXIME AXETIL 500 MG PO TABS: 500.0000 mg | ORAL_TABLET | Freq: Two times a day (BID) | ORAL | 0 refills | Status: AC

## 2021-04-06 NOTE — Patient Instructions (Signed)
Disuria Dysuria La disuria es dolor o molestia durante la miccin. El dolor o la molestia se pueden sentir en la parte del cuerpo que transporta la orina fuera de la vejiga (uretra) o en el tejido que rodea los genitales. El dolor tambin se puede sentir enla zona de la ingle, en la parte inferior del abdomen y de la zona lumbar. Quizs tenga que orinar con frecuencia o la sensacin repentina de tener que orinar (tenesmo vesical). La disuria puede afectar a los hombres, pero es ms frecuente en las mujeres. La causa puede deberse a muchos problemas diferentes: Infeccin de las vas urinarias. Clculos renales o en la vejiga. Ciertas infecciones de transmisin sexual (ITS), como la clamidia. Deshidratacin. Inflamacin de los tejidos de la vagina. Uso de ciertos medicamentos. Uso de ciertos jabones o productos perfumados que provocan irritacin. Siga estas instrucciones en su casa: Medicamentos Use los medicamentos de venta libre y los recetados solamente como se lo haya indicado el mdico. Si le recetaron un antibitico, tmelo como se lo haya indicado el mdico. No deje de tomar el antibitico aunque comience a sentirse mejor. Qu debe comer y beber  Electronics engineer suficiente lquido como para Theatre manager la orina de color amarillo plido. Evite las bebidas con cafena, el t y el alcohol. Estas bebidas pueden irritar la vejiga y Actuary disuria. En los hombres, el alcohol puede irritar la prstata.  Instrucciones generales Controle su afeccin para detectar cualquier cambio. Orine con frecuencia. Evite retener la orina durante largos perodos. Si es [place], debe limpiarse de adelante hacia atrs despus de Garment/textile technologist o defecar. Use cada trozo de papel higinico una sola vez. Vaciar la vejiga despus de Adult nurse. Cumpla con todas las visitas de seguimiento. Esto es importante. Si le realizaron pruebas para Product manager causa de la disuria, es su responsabilidad retirar los resultados de [place].  Consulte al mdico o pregunte en el departamento donde se realiza la prueba cundo estarn [employer]. Comunquese con un mdico si: Tiene fiebre. Siente dolor en la espalda o a los costados del cuerpo. Tiene nuseas o vmitos. Observa sangre en la orina. Est orinando con ms frecuencia que lo habitual. Solicite ayuda de inmediato si: El dolor es intenso y no se Engineer, production con los medicamentos. No puede comer ni beber sin vomitar. Se siente confundido. Tiene el latido cardaco acelerado en reposo. Tiene temblores o escalofros. Se siente muy dbil. Resumen La disuria es dolor o molestia al Garment/textile technologist. Existen muchas afecciones que pueden causar disuria. Si tiene disuria, es posible que tenga que orinar con frecuencia o tenga la sensacin repentina de tener que orinar (tenesmo vesical). Controle su afeccin para Actuary cambio. Cumpla con todas las visitas de seguimiento. Asegrese de orinar con frecuencia y beber suficiente lquido para mantener la orina de color amarillo plido. Esta informacin no tiene Marine scientist el consejo del mdico. Asegresede hacerle al mdico cualquier pregunta que tenga. Document Revised: 05/22/2020 Document Reviewed: 05/22/2020 Elsevier Patient Education  2022 [employer].

## 2021-04-06 NOTE — Progress Notes (Signed)
Desiree Krueger 50 y.o.   Chief Complaint  Patient presents with   Dysuria    X 3 wks    HISTORY OF PRESENT ILLNESS: This is a 50 y.o. female complaining of burning on urination that started 3 weeks ago and persists. Denies hematuria.  Denies nausea or vomiting.  Able to eat and drink.  Denies fever or chills. Had similar episode 1 year ago.  Denies vaginal discharge.  Symptoms started shortly after sexual intercourse. No other complaints or medical concerns today.  Dysuria  Associated symptoms include frequency. Pertinent negatives include no chills, flank pain, hematuria, nausea or vomiting.    Prior to Admission medications   Medication Sig Start Date End Date Taking? Authorizing Provider  Calcium Carb-Cholecalciferol (CALCIUM + D3 PO) Take by mouth daily.   Yes [provider]  Ferrous Sulfate (IRON PO) Take by mouth daily.   Yes [provider]  nortriptyline (PAMELOR) 10 MG capsule Take 2 capsules (20 mg total) by mouth at bedtime. 12/24/20  Yes Marcial Pacas, MD  ondansetron (ZOFRAN ODT) 4 MG disintegrating tablet Take 1 tablet (4 mg total) by mouth every 8 (eight) hours as needed. 12/24/20  Yes Marcial Pacas, MD  rizatriptan (MAXALT-MLT) 10 MG disintegrating tablet Take 1 tablet (10 mg total) by mouth as needed. May repeat in 2 hours if needed 12/24/20  Yes Marcial Pacas, MD  aspirin-acetaminophen-caffeine [facility] MIGRAINE) [phone] MG tablet Take by mouth every 6 (six) hours as needed for headache. Patient not taking: Reported on 04/06/2021    [provider]  SUMAtriptan (IMITREX) 50 MG tablet Take 50 mg at the onset of headache; may repeat every 2 hours but no more than 200 mg/24 hours. Patient not taking: Reported on 04/06/2021 11/30/20   Horald Pollen, MD    No Known Allergies  Patient Active Problem List   Diagnosis Date Noted   Conductive hearing loss, bilateral 02/10/2021   Chronic bilateral low back pain without sciatica 02/10/2021    Chronic migraine w/o aura w/o status migrainosus, not intractable 12/24/2020   Headache, new daily persistent ([facility]) 12/24/2020   Anemia due to chronic blood loss 03/16/2016   Submucous leiomyoma of uterus 03/16/2016   DUB (dysfunctional uterine bleeding) 02/22/2016    Past Medical History:  Diagnosis Date   Anemia    Constipation    Depression    Dysrhythmia    GERD (gastroesophageal reflux disease)    Headache    MIGRAINES   Low blood pressure    Lower back pain    Thyroid disease    Varicose vein of leg    BILATERAL    Past Surgical History:  Procedure Laterality Date   DILATATION & CURETTAGE/HYSTEROSCOPY WITH MYOSURE N/A 01/31/2017   Procedure: DILATATION & CURETTAGE/HYSTEROSCOPY WITH MYOSURE;  Surgeon: Terrance Mass, MD;  Location: [facility];  Service: Gynecology;  Laterality: N/A;   DILATATION & CURETTAGE/HYSTEROSCOPY WITH MYOSURE N/A 09/29/2017   Procedure: DILATATION & CURETTAGE/HYSTEROSCOPY WITH MYOSURE RESECTION OF SUBMUCOSAL MYOMA;  Surgeon: Princess Bruins, MD;  Location: [facility];  Service: Gynecology;  Laterality: N/A;  request to follow 2nd case around 10am in [place] block time Requests one hour OR time   HYSTEROSCOPY WITH NOVASURE N/A 09/29/2017   Procedure: [place];  Surgeon: Princess Bruins, MD;  Location: [facility];  Service: Gynecology;  Laterality: N/A;   TUBAL LIGATION      Social History   Socioeconomic History   Marital status: Single  Spouse name: Not on file   Number of children: 2   Years of education: two years college   Highest education level: Not on file  Occupational History   Occupation: warehouse  Tobacco Use   Smoking status: Former    Years: 6.00    Types: Cigarettes   Smokeless tobacco: Never  Vaping Use   Vaping Use: Never used  Substance and Sexual Activity   Alcohol use: Yes    Alcohol/week: 0.0 standard drinks    Comment: OCC    Drug use: Never   Sexual activity: Not Currently  Other Topics Concern   Not on file  Social History Narrative   Lives at home with her daughter.   Right-handed.   Caffeine use: 3 cups per day.   Social Determinants of Health   Financial Resource Strain: Not on file  Food Insecurity: Not on file  Transportation Needs: Not on file  Physical Activity: Not on file  Stress: Not on file  Social Connections: Not on file  Intimate Partner Violence: Not on file    Family History  Problem Relation Age of Onset   Cancer Maternal Uncle        COLON   Parkinson's disease Father    Varicose Veins Mother    Colon cancer Neg Hx    Esophageal cancer Neg Hx    Rectal cancer Neg Hx    Stomach cancer Neg Hx      Review of Systems  Constitutional: Negative.  Negative for chills and fever.  HENT: Negative.  Negative for congestion and sore throat.   Respiratory: Negative.  Negative for cough and shortness of breath.   Cardiovascular:  Negative for chest pain and palpitations.  Gastrointestinal:  Negative for abdominal pain, diarrhea, nausea and vomiting.  Genitourinary:  Positive for dysuria and frequency. Negative for flank pain and hematuria.  Skin: Negative.  Negative for rash.  Neurological:  Negative for dizziness and headaches.  All other systems reviewed and are negative.   Physical Exam Vitals reviewed.  Constitutional:      Appearance: Normal appearance.  HENT:     Head: Normocephalic.  Eyes:     Extraocular Movements: Extraocular movements intact.     Pupils: Pupils are equal, round, and reactive to light.  Cardiovascular:     Rate and Rhythm: Normal rate and regular rhythm.     Pulses: Normal pulses.     Heart sounds: Normal heart sounds.  Pulmonary:     Effort: Pulmonary effort is normal.     Breath sounds: Normal breath sounds.  Abdominal:     Palpations: Abdomen is soft.     Tenderness: There is no abdominal tenderness. There is no right CVA tenderness or left  CVA tenderness.  Musculoskeletal:        General: Normal range of motion.     Cervical back: Normal range of motion and neck supple.  Skin:    General: Skin is warm and dry.     Capillary Refill: Capillary refill takes less than 2 seconds.  Neurological:     General: No focal deficit present.     Mental Status: She is alert and oriented to person, place, and time.  Psychiatric:        Mood and Affect: Mood normal.        Behavior: Behavior normal.    Results for orders placed or performed in visit on 04/06/21 (from the past 24 hour(s))  POCT urinalysis dipstick     Status:  Abnormal   Collection Time: 04/06/21  2:13 PM  Result Value Ref Range   Color, UA yellow yellow   Clarity, UA clear clear   Glucose, UA negative negative mg/dL   Bilirubin, UA negative negative   Ketones, POC UA negative negative mg/dL   Spec Grav, UA 1.010 1.010 - 1.025   Blood, UA small (A) negative   pH, UA 6.5 5.0 - 8.0   Protein Ur, POC negative negative mg/dL   Urobilinogen, UA 0.2 0.2 or 1.0 E.U./dL   Nitrite, UA Negative Negative   Leukocytes, UA Negative Negative    ASSESSMENT & PLAN: Clinically stable.  No red flag signs or symptoms. No pyelonephritis. Clinical infection.  Will start antibiotics today pending urine culture results. Advised to contact the office if no better or worse during the next several days. Kailiana was seen today for dysuria.  Diagnoses and all orders for this visit:  Dysuria -     POCT urinalysis dipstick -     Urine Culture  Clinical infection -     cefUROXime (CEFTIN) 500 MG tablet; Take 1 tablet (500 mg total) by mouth 2 (two) times daily with a meal for 7 days.  Patient Instructions  Disuria Dysuria La disuria es dolor o molestia durante la miccin. El dolor o la molestia se pueden sentir en la parte del cuerpo que transporta la orina fuera de la vejiga (uretra) o en el tejido que rodea los genitales. El dolor tambin se puede sentir enla zona de la ingle, en la  parte inferior del abdomen y de la zona lumbar. Quizs tenga que orinar con frecuencia o la sensacin repentina de tener que orinar (tenesmo vesical). La disuria puede afectar a los hombres, pero es ms frecuente en las mujeres. La causa puede deberse a muchos problemas diferentes: Infeccin de las vas urinarias. Clculos renales o en la vejiga. Ciertas infecciones de transmisin sexual (ITS), como la clamidia. Deshidratacin. Inflamacin de los tejidos de la vagina. Uso de ciertos medicamentos. Uso de ciertos jabones o productos perfumados que provocan irritacin. Siga estas instrucciones en su casa: Medicamentos Use los medicamentos de venta libre y los recetados solamente como se lo haya indicado el mdico. Si le recetaron un antibitico, tmelo como se lo haya indicado el mdico. No deje de tomar el antibitico aunque comience a sentirse mejor. Qu debe comer y beber  Electronics engineer suficiente lquido como para Theatre manager la orina de color amarillo plido. Evite las bebidas con cafena, el t y el alcohol. Estas bebidas pueden irritar la vejiga y Actuary disuria. En los hombres, el alcohol puede irritar la prstata.  Instrucciones generales Controle su afeccin para detectar cualquier cambio. Orine con frecuencia. Evite retener la orina durante largos perodos. Si es [place], debe limpiarse de adelante hacia atrs despus de Garment/textile technologist o defecar. Use cada trozo de papel higinico una sola vez. Vaciar la vejiga despus de Adult nurse. Cumpla con todas las visitas de seguimiento. Esto es importante. Si le realizaron pruebas para Product manager causa de la disuria, es su responsabilidad retirar los resultados de Spray. Consulte al mdico o pregunte en el departamento donde se realiza la prueba cundo estarn [employer]. Comunquese con un mdico si: Tiene fiebre. Siente dolor en la espalda o a los costados del cuerpo. Tiene nuseas o vmitos. Observa sangre en la orina. Est orinando  con ms frecuencia que lo habitual. Solicite ayuda de inmediato si: El dolor es intenso y no se Engineer, production con los medicamentos. No puede  comer ni beber sin vomitar. Se siente confundido. Tiene el latido cardaco acelerado en reposo. Tiene temblores o escalofros. Se siente muy dbil. Resumen La disuria es dolor o molestia al Garment/textile technologist. Existen muchas afecciones que pueden causar disuria. Si tiene disuria, es posible que tenga que orinar con frecuencia o tenga la sensacin repentina de tener que orinar (tenesmo vesical). Controle su afeccin para Actuary cambio. Cumpla con todas las visitas de seguimiento. Asegrese de orinar con frecuencia y beber suficiente lquido para mantener la orina de color amarillo plido. Esta informacin no tiene Marine scientist el consejo del mdico. Asegresede hacerle al mdico cualquier pregunta que tenga. Document Revised: 05/22/2020 Document Reviewed: 05/22/2020 Elsevier Patient Education  2022 [employer], MD [facility]

## 2021-04-07 ENCOUNTER — Ambulatory Visit: Payer: Managed Care, Other (non HMO) | Admitting: Physical Therapy

## 2021-04-07 LAB — URINE CULTURE: Result:: NO GROWTH

## 2021-04-16 ENCOUNTER — Encounter: Payer: Self-pay | Admitting: Emergency Medicine

## 2021-04-20 ENCOUNTER — Ambulatory Visit: Payer: Managed Care, Other (non HMO)

## 2021-08-18 ENCOUNTER — Telehealth: Payer: Self-pay | Admitting: Emergency Medicine

## 2021-08-18 ENCOUNTER — Encounter: Payer: Self-pay | Admitting: Emergency Medicine

## 2021-08-18 ENCOUNTER — Ambulatory Visit (INDEPENDENT_AMBULATORY_CARE_PROVIDER_SITE_OTHER): Payer: Managed Care, Other (non HMO) | Admitting: Emergency Medicine

## 2021-08-18 ENCOUNTER — Other Ambulatory Visit: Payer: Self-pay

## 2021-08-18 VITALS — BP 110/62 | HR 64 | Temp 98.0°F | Ht 63.0 in | Wt 136.0 lb

## 2021-08-18 DIAGNOSIS — Z780 Asymptomatic menopausal state: Secondary | ICD-10-CM | POA: Diagnosis not present

## 2021-08-18 DIAGNOSIS — G43709 Chronic migraine without aura, not intractable, without status migrainosus: Secondary | ICD-10-CM

## 2021-08-18 DIAGNOSIS — G43719 Chronic migraine without aura, intractable, without status migrainosus: Secondary | ICD-10-CM | POA: Diagnosis not present

## 2021-08-18 DIAGNOSIS — R300 Dysuria: Secondary | ICD-10-CM

## 2021-08-18 DIAGNOSIS — R3129 Other microscopic hematuria: Secondary | ICD-10-CM | POA: Diagnosis not present

## 2021-08-18 LAB — POCT URINALYSIS DIP (MANUAL ENTRY)
Bilirubin, UA: NEGATIVE
Glucose, UA: NEGATIVE mg/dL
Ketones, POC UA: NEGATIVE mg/dL
Leukocytes, UA: NEGATIVE
Nitrite, UA: NEGATIVE
Protein Ur, POC: NEGATIVE mg/dL
Spec Grav, UA: 1.01 (ref 1.010–1.025)
Urobilinogen, UA: 0.2 E.U./dL
pH, UA: 6.5 (ref 5.0–8.0)

## 2021-08-18 NOTE — Assessment & Plan Note (Signed)
Needs general evaluation by gynecologist.

## 2021-08-18 NOTE — Patient Instructions (Signed)

## 2021-08-18 NOTE — Assessment & Plan Note (Addendum)
Recurrent episodes.  Former smoker.  Needs evaluation by urologist.

## 2021-08-18 NOTE — Assessment & Plan Note (Signed)
Present medications helping some but still has frequent headaches interfering with quality of life and work schedule.  Needs FMLA papers Also needs to follow-up with neurologist to optimize medical therapy.

## 2021-08-18 NOTE — Telephone Encounter (Signed)
Type of form received (Home Health, FMLA, disability, handicapped placard, Surgical clearance) FMLA  Form placed in (E-fax folder, Provider mailbox) Provider  Additional instructions from the patient (mail, fax, notify by phone when complete) Notify by phone when complete   Things to remember: [place] office: If form received in person, remind patient that forms take 7-10 business days CMA should attach charge sheet and put on Supervisor's desk

## 2021-08-18 NOTE — Progress Notes (Signed)
Desiree Krueger 51 y.o.   Chief Complaint  Patient presents with   Migraine    Pt would like FMLA for migraine flare up also physical form needs to be completed   Dysuria    Burning and bleeding yesterday    HISTORY OF PRESENT ILLNESS: This is a 51 y.o. female complaining of the following: 1.  History of chronic migraines.  Needs FMLA paperwork. Sees neurologist on a regular basis.  Medications only helping marginally. 2.  Burning on urination with blood since yesterday.  Has history of similar recurrent episodes.  Non-smoker. Last urine culture was negative. 3.  COVID infection on 08/07/2021.  Still dealing with issues of congestion and cough. 4.  Requesting GYN referral.   Migraine  Pertinent negatives include no abdominal pain, coughing, fever, nausea, sore throat or vomiting.  Dysuria  Associated symptoms include hematuria. Pertinent negatives include no chills, nausea or vomiting.    Prior to Admission medications   Medication Sig Start Date End Date Taking? Authorizing Provider  aspirin-acetaminophen-caffeine (EXCEDRIN MIGRAINE) [phone] MG tablet Take by mouth every 6 (six) hours as needed for headache.   Yes [provider]  Calcium Carb-Cholecalciferol (CALCIUM + D3 PO) Take by mouth daily.   Yes [provider]  Ferrous Sulfate (IRON PO) Take by mouth daily.   Yes [provider]  nortriptyline (PAMELOR) 10 MG capsule Take 2 capsules (20 mg total) by mouth at bedtime. 12/24/20  Yes Marcial Pacas, MD  rizatriptan (MAXALT-MLT) 10 MG disintegrating tablet Take 1 tablet (10 mg total) by mouth as needed. May repeat in 2 hours if needed 12/24/20  Yes Marcial Pacas, MD  SUMAtriptan (IMITREX) 50 MG tablet Take 50 mg at the onset of headache; may repeat every 2 hours but no more than 200 mg/24 hours. 11/30/20  Yes Justun Anaya, Ines Bloomer, MD  ondansetron (ZOFRAN ODT) 4 MG disintegrating tablet Take 1 tablet (4 mg total) by mouth every 8 (eight) hours as  needed. Patient not taking: Reported on 08/18/2021 12/24/20   Marcial Pacas, MD    No Known Allergies  Patient Active Problem List   Diagnosis Date Noted   Conductive hearing loss, bilateral 02/10/2021   Chronic bilateral low back pain without sciatica 02/10/2021   Chronic migraine w/o aura w/o status migrainosus, not intractable 12/24/2020   Headache, new daily persistent (NDPH) 12/24/2020   Anemia due to chronic blood loss 03/16/2016   Submucous leiomyoma of uterus 03/16/2016   DUB (dysfunctional uterine bleeding) 02/22/2016    Past Medical History:  Diagnosis Date   Anemia    Constipation    Depression    Dysrhythmia    GERD (gastroesophageal reflux disease)    Headache    MIGRAINES   Low blood pressure    Lower back pain    Thyroid disease    Varicose vein of leg    BILATERAL    Past Surgical History:  Procedure Laterality Date   DILATATION & CURETTAGE/HYSTEROSCOPY WITH MYOSURE N/A 01/31/2017   Procedure: DILATATION & CURETTAGE/HYSTEROSCOPY WITH MYOSURE;  Surgeon: Terrance Mass, MD;  Location: [facility];  Service: Gynecology;  Laterality: N/A;   DILATATION & CURETTAGE/HYSTEROSCOPY WITH MYOSURE N/A 09/29/2017   Procedure: DILATATION & CURETTAGE/HYSTEROSCOPY WITH MYOSURE RESECTION OF SUBMUCOSAL MYOMA;  Surgeon: Princess Bruins, MD;  Location: [facility];  Service: Gynecology;  Laterality: N/A;  request to follow 2nd case around 10am in [place] block time Requests one hour OR time   HYSTEROSCOPY WITH NOVASURE N/A 09/29/2017  Procedure: HYSTEROSCOPY WITH NOVASURE ENDOMETRIAL ABLATION;  Surgeon: Princess Bruins, MD;  Location: [facility];  Service: Gynecology;  Laterality: N/A;   TUBAL LIGATION      Social History   Socioeconomic History   Marital status: Single    Spouse name: Not on file   Number of children: 2   Years of education: two years college   Highest education level: Not on file  Occupational History   Occupation:  warehouse  Tobacco Use   Smoking status: Former    Years: 6.00    Types: Cigarettes   Smokeless tobacco: Never  Vaping Use   Vaping Use: Never used  Substance and Sexual Activity   Alcohol use: Yes    Alcohol/week: 0.0 standard drinks    Comment: OCC   Drug use: Never   Sexual activity: Not Currently  Other Topics Concern   Not on file  Social History Narrative   Lives at home with her daughter.   Right-handed.   Caffeine use: 3 cups per day.   Social Determinants of Health   Financial Resource Strain: Not on file  Food Insecurity: Not on file  Transportation Needs: Not on file  Physical Activity: Not on file  Stress: Not on file  Social Connections: Not on file  Intimate Partner Violence: Not on file    Family History  Problem Relation Age of Onset   Cancer Maternal Uncle        COLON   Parkinson's disease Father    Varicose Veins Mother    Colon cancer Neg Hx    Esophageal cancer Neg Hx    Rectal cancer Neg Hx    Stomach cancer Neg Hx      Review of Systems  Constitutional: Negative.  Negative for chills and fever.  HENT: Negative.  Negative for congestion and sore throat.   Respiratory: Negative.  Negative for cough and shortness of breath.   Cardiovascular: Negative.  Negative for chest pain and palpitations.  Gastrointestinal: Negative.  Negative for abdominal pain, diarrhea, nausea and vomiting.  Genitourinary:  Positive for dysuria and hematuria.  Skin: Negative.  Negative for rash.  Neurological:  Positive for headaches.  All other systems reviewed and are negative.   Physical Exam Vitals reviewed.  Constitutional:      Appearance: Normal appearance.  HENT:     Head: Normocephalic.  Eyes:     Extraocular Movements: Extraocular movements intact.     Conjunctiva/sclera: Conjunctivae normal.     Pupils: Pupils are equal, round, and reactive to light.  Cardiovascular:     Rate and Rhythm: Normal rate and regular rhythm.     Pulses: Normal  pulses.     Heart sounds: Normal heart sounds.  Pulmonary:     Effort: Pulmonary effort is normal.     Breath sounds: Normal breath sounds.  Musculoskeletal:        General: Normal range of motion.     Cervical back: Normal range of motion and neck supple.  Skin:    General: Skin is warm and dry.     Capillary Refill: Capillary refill takes less than 2 seconds.  Neurological:     General: No focal deficit present.     Mental Status: She is alert and oriented to person, place, and time.  Psychiatric:        Mood and Affect: Mood normal.        Behavior: Behavior normal.     ASSESSMENT & PLAN: Problem List Items  Addressed This Visit       Cardiovascular and Mediastinum   Chronic migraine w/o aura w/o status migrainosus, not intractable    Present medications helping some but still has frequent headaches interfering with quality of life and work schedule.  Needs FMLA papers Also needs to follow-up with neurologist to optimize medical therapy.      Intractable chronic migraine without aura and without status migrainosus     Genitourinary   Microscopic hematuria - Primary    Recurrent episodes.  Former smoker.  Needs evaluation by urologist.      Relevant Orders   Ambulatory referral to Urology     Other   Dysuria   Relevant Orders   POCT urinalysis dipstick (Completed)   Postmenopausal    Needs general evaluation by gynecologist.      Relevant Orders   Ambulatory referral to Gynecology   Patient Instructions  Mantenimiento de la salud en [place] Maintenance, Female Adoptar un estilo de vida saludable y recibir atencin preventiva son importantes para promover la salud y Musician. Consulte al mdico sobre: El esquema adecuado para hacerse pruebas y exmenes peridicos. Cosas que puede hacer por su cuenta para prevenir enfermedades y [place] sano. Qu debo saber sobre la dieta, el peso y el ejercicio? Consuma una dieta saludable  Consuma una  dieta que incluya muchas verduras, frutas, productos lcteos con bajo contenido de [place] y Advertising account planner. No consuma muchos alimentos ricos en grasas slidas, azcares agregados o sodio. Mantenga un peso saludable El ndice de masa muscular [facility]) se [place] and the [place] para identificar problemas de [place]. Proporciona una estimacin de la grasa corporal basndose en el peso y la altura. Su mdico puede ayudarle a Radiation protection practitioner [place] y a Scientist, forensic o Theatre manager un peso saludable. Haga ejercicio con regularidad Haga ejercicio con regularidad. Esta es una de las prcticas ms importantes que puede hacer por su salud. La [employer] de los adultos deben seguir estas pautas: Optometrist, al menos, 150 minutos de actividad fsica por semana. El ejercicio debe aumentar la frecuencia cardaca y Nature conservation officer transpirar (ejercicio de intensidad moderada). Hacer ejercicios de fortalecimiento por lo [employer] por semana. Agregue esto a su plan de ejercicio de intensidad moderada. Pase menos tiempo sentada. Incluso la actividad fsica ligera puede ser beneficiosa. Controle sus niveles de colesterol y lpidos en la sangre Comience a realizarse anlisis de lpidos y Research officer, trade union en la sangre a los 22 aos y luego reptalos cada 5 aos. Hgase controlar los niveles de colesterol con mayor frecuencia si: Sus niveles de lpidos y colesterol son altos. Es mayor de 52 aos. Presenta un alto riesgo de padecer enfermedades cardacas. Qu debo saber sobre las pruebas de deteccin del cncer? Segn su historia clnica y sus antecedentes familiares, es posible que deba realizarse pruebas de deteccin del cncer en diferentes edades. Esto puede incluir pruebas de deteccin de lo siguiente: Cncer de mama. Cncer de cuello uterino. Cncer colorrectal. Cncer de piel. Cncer de pulmn. Qu debo saber sobre la enfermedad cardaca, la diabetes y la hipertensin arterial? Presin arterial y enfermedad cardaca La hipertensin arterial causa  enfermedades cardacas y [place] el riesgo de accidente cerebrovascular. Es ms probable que esto se manifieste en las personas que tienen lecturas de presin arterial alta o tienen sobrepeso. Hgase controlar la presin arterial: Cada 3 a 5 aos si tiene entre 18 y 72 aos. Todos los aos si es mayor de 40 aos. Diabetes Realcese exmenes de deteccin de la diabetes con regularidad. Este anlisis revisa  el nivel de azcar en la sangre en ayunas. Hgase las pruebas de deteccin: Cada tres aos despus de los 73 aos de edad si tiene un peso normal y un bajo riesgo de padecer diabetes. Con ms frecuencia y a partir de Nixon edad inferior si tiene sobrepeso o un alto riesgo de padecer diabetes. Qu debo saber sobre la prevencin de infecciones? Hepatitis B Si tiene un riesgo ms alto de contraer hepatitis B, debe someterse a un examen de deteccin de este virus. Hable con el mdico para averiguar si tiene riesgo de contraer la infeccin por hepatitis B. Hepatitis C Se recomienda el anlisis a: [employer] 1945 y 1965. Todas las personas que tengan un riesgo de haber contrado hepatitis C. Enfermedades de transmisin sexual (ETS) Hgase las pruebas de Programme researcher, broadcasting/film/video de ITS, incluidas la gonorrea y la clamidia, si: Es sexualmente activa y es menor de 34 aos. Es mayor de 46 aos, y Investment banker, operational informa que corre riesgo de tener este tipo de infecciones. La actividad sexual ha cambiado desde que le hicieron la ltima prueba de deteccin y tiene un riesgo mayor de Best boy clamidia o Radio broadcast assistant. Pregntele al mdico si usted tiene riesgo. Pregntele al mdico si usted tiene un alto riesgo de Museum/gallery curator VIH. El mdico tambin puede recomendarle un medicamento recetado para ayudar a evitar la infeccin por el VIH. Si elige tomar medicamentos para prevenir el VIH, primero debe [employer] de deteccin del VIH. Luego debe hacerse anlisis cada 3 meses mientras est tomando los  medicamentos. Embarazo Si est por dejar de Librarian, academic (fase premenopusica) y usted puede quedar [place], busque asesoramiento antes de [place]. Tome de 400 a 800 microgramos (mcg) de cido [employer] si [place]. Pida mtodos de control de la natalidad (anticonceptivos) si desea evitar un embarazo no deseado. Osteoporosis y [place] La osteoporosis es una enfermedad en la que los huesos pierden los minerales y la fuerza por el avance de la edad. El resultado pueden ser fracturas en los [employer]. Si tiene 61 aos o ms, o si est en riesgo de sufrir osteoporosis y fracturas, pregunte a su mdico si debe: Hacerse pruebas de deteccin de prdida sea. Tomar un suplemento de calcio o de vitamina D para reducir el riesgo de fracturas. Recibir terapia de reemplazo hormonal (TRH) para tratar los sntomas de la menopausia. Siga estas indicaciones en su casa: Consumo de alcohol No beba alcohol si: Su mdico le indica no hacerlo. Est embarazada, puede estar embarazada o est tratando de [place]. Si bebe alcohol: Limite la cantidad que bebe a lo siguiente: De 0 a 1 bebida por da. Sepa cunta cantidad de alcohol hay en las bebidas que toma. En los [place], una medida equivale a una botella de cerveza de 12 oz (355 ml), un vaso de vino de 5 oz (148 ml) o un vaso de una bebida alcohlica de alta graduacin de 1 oz (44 ml). Estilo de vida No consuma ningn producto que contenga nicotina o tabaco. Estos productos incluyen cigarrillos, tabaco para Higher education careers adviser y aparatos de vapeo, como los Psychologist, sport and exercise. Si necesita ayuda para dejar de consumir estos productos, consulte al mdico. No consuma drogas. No comparta agujas. Solicite ayuda a su mdico si necesita apoyo o informacin para abandonar las drogas. Indicaciones generales Realcese los estudios de rutina de la salud, dentales y de Public librarian. [place]. Infrmele a su  mdico si: Se siente deprimida con frecuencia. Alguna vez  ha sido vctima de Orthoptist o no se siente seguro en su casa. Resumen Adoptar un estilo de vida saludable y recibir atencin preventiva son importantes para promover la salud y Musician. Siga las instrucciones del mdico acerca de una dieta saludable, el ejercicio y la realizacin de pruebas o exmenes para Engineer, building services. Siga las instrucciones del mdico con respecto al control del colesterol y la presin arterial. Esta informacin no tiene Marine scientist el consejo del mdico. Asegrese de hacerle al mdico cualquier pregunta que tenga. Document Revised: 01/07/2021 Document Reviewed: 01/07/2021 Elsevier Patient Education  2022 Hunter, MD [facility]

## 2021-08-25 DIAGNOSIS — Z0279 Encounter for issue of other medical certificate: Secondary | ICD-10-CM

## 2021-08-25 NOTE — Telephone Encounter (Signed)
Called and left a vm that forms have been completed and ready to pick up.

## 2021-09-13 ENCOUNTER — Ambulatory Visit (INDEPENDENT_AMBULATORY_CARE_PROVIDER_SITE_OTHER): Payer: 59 | Admitting: Neurology

## 2021-09-13 ENCOUNTER — Other Ambulatory Visit: Payer: Self-pay

## 2021-09-13 ENCOUNTER — Encounter: Payer: Self-pay | Admitting: Neurology

## 2021-09-13 VITALS — BP 120/73 | HR 65 | Wt 138.0 lb

## 2021-09-13 DIAGNOSIS — G43709 Chronic migraine without aura, not intractable, without status migrainosus: Secondary | ICD-10-CM | POA: Diagnosis not present

## 2021-09-13 MED ORDER — TIZANIDINE HCL 4 MG PO TABS: 4.0000 mg | ORAL_TABLET | Freq: Four times a day (QID) | ORAL | 6 refills | Status: DC | PRN

## 2021-09-13 MED ORDER — ONDANSETRON 4 MG PO TBDP: 4.0000 mg | ORAL_TABLET | Freq: Three times a day (TID) | ORAL | 6 refills | Status: DC | PRN

## 2021-09-13 MED ORDER — VENLAFAXINE HCL ER 37.5 MG PO CP24: 75.0000 mg | ORAL_CAPSULE | Freq: Every day | ORAL | 11 refills | Status: DC

## 2021-09-13 MED ORDER — AIMOVIG 70 MG/ML ~~LOC~~ SOAJ: 70.0000 mg | SUBCUTANEOUS | 11 refills | Status: DC

## 2021-09-13 MED ORDER — RIZATRIPTAN BENZOATE 10 MG PO TBDP: 10.0000 mg | ORAL_TABLET | ORAL | 11 refills | Status: DC | PRN

## 2021-09-13 NOTE — Patient Instructions (Signed)
Meds ordered this encounter    Erenumab-aooe (AIMOVIG) 70 MG/ML SOAJ-----preventive medications    Sig: Inject 70 mg into the skin every 30 (thirty) days.    Dispense:  1.12 mL    Refill:  11     venlafaxine XR (EFFEXOR XR) 37.5 MG 24 hr capsule---Daily preventive medications    Sig: Take 2 capsules (75 mg total) by mouth daily with breakfast.    Dispense:  60 capsule    Refill:  11     rizatriptan (MAXALT-MLT) 10 MG disintegrating tablet    Sig: Take 1 tablet (10 mg total) by mouth as needed. May repeat in 2 hours if needed    Dispense:  12 tablet    Refill:  11    Do not refill in less than 30 days   tiZANidine (ZANAFLEX) 4 MG tablet    Sig: Take 1 tablet (4 mg total) by mouth every 6 (six) hours as needed for muscle spasms.    Dispense:  30 tablet    Refill:  6   ondansetron (ZOFRAN-ODT) 4 MG disintegrating tablet    Sig: Take 1 tablet (4 mg total) by mouth every 8 (eight) hours as needed for nausea or vomiting.    Dispense:  20 tablet    Refill:  6   When you do have severe migraine headaches, you may take rizatriptan as needed, combine it with Zofran for nausea, tizanidine for muscle relaxant, Add on Aleve 1 to 2 tablets as needed, even Benadryl 25 mg as needed for sleep,   Limit Maxalt used to less than 2 tablets in 24 hours, less than 3 times in 1 week

## 2021-09-13 NOTE — Progress Notes (Signed)
Chief Complaint  Patient presents with   Follow-up    Rm 15, states migraines are worse, reports 3-4 migraines a week       ASSESSMENT AND PLAN  Desiree Krueger is a 51 y.o. female   Chronic migraine headaches   She reported change of her headache pattern, desired further evaluation, proceed with MRI of the brain without contrast  Likely a component of medication overuse,  She reported no benefit with nortriptyline up to 20 mg every night,  Will try Effexor Exar 37.5 mg, also added on aimovig 70 mg monthly as preventive medication  Suboptimal response to Imitrex 50 mg, Maxalt 10 mg dissolvable, may combine it with Zofran, Aleve for severe prolonged headaches  Return to clinic with Sarah in 3 months   DIAGNOSTIC DATA (LABS, IMAGING, TESTING) - I reviewed patient records, labs, notes, testing and imaging myself where available.   HISTORICAL  Desiree Krueger is a 51 year old female, seen in request by her primary care physician Dr.   Mitchel Honour, [facility], for evaluation of frequent headaches, initial evaluation was on Dec 24, 2020   I reviewed and summarized the referring note. PMHx.  She reported a long history of chronic migraine headaches, started in her 69s, previous headache was very severe, over the years, she was able to identify triggers, weather change, stress, strong smells, bright light, excessive coffee intake, spicy food, cheese, red wine  She has made major changes in her diet, with some improvement of her headache, but she still has 4 headaches days in a week, she described lateralized, sometimes holoacranial pressure headaches, evolving to severe pounding headache with light noise smell sensitivity, lasting for hours  For a while, she was using frequent Excedrin Migraine almost daily basis, in February 2022, she was giving prescription of Imitrex 50 mg as needed, she used up her monthly supply of 9 tablets, sometimes has to buy extra, and laid back on  her over-the-counter medication  She never tried preventive medication in the past, her brother also suffered migraine  She denies visual loss, father passed away with Parkinson's disease, mother suffered dementia  With her frequent headaches, also complains of emotional outburst, her mood switch quickly'  UPDATE Sep 13 2021: She is no longer taking Nortriptyline, up to 20mg  qhs, did not help her headache much, cause constipation, Maxalt prn was helpful, up to 2 tabs/one day up to 4 days a week, her headache usually improve after 1 hour, she is also taking magnesium and riboflavin   She continues to complains of mood change, depression, anxiety, in tear today. She has to stay in dark all the time.  Personally reviewed MRI of the brain without contrast in May 2022: Mild small vessel disease, no acute abnormality   PHYSICAL EXAM:   Vitals:   09/13/21 0737  BP: 120/73  Pulse: 65  Weight: 138 lb (62.6 kg)   Not recorded     Body mass index is 24.45 kg/m.  PHYSICAL EXAMNIATION:  Gen: NAD, conversant, well nourised, well groomed                     Cardiovascular: Regular rate rhythm, no peripheral edema, warm, nontender. Eyes: Conjunctivae clear without exudates or hemorrhage Neck: Supple, no carotid bruits. Pulmonary: Clear to auscultation bilaterally   NEUROLOGICAL EXAM:  MENTAL STATUS: Speech:    Speech is normal; fluent and spontaneous with normal comprehension.  Cognition:     Orientation to time, place and person  Normal recent and remote memory     Normal Attention span and concentration     Normal Language, naming, repeating,spontaneous speech     Fund of knowledge   CRANIAL NERVES: CN II: Visual fields are full to confrontation. Pupils are round equal and briskly reactive to light. CN III, IV, VI: extraocular movement are normal. No ptosis. CN V: Facial sensation is intact to light touch CN VII: Face is symmetric with normal eye closure  CN VIII: Hearing  is normal to causal conversation. CN IX, X: Phonation is normal. CN XI: Head turning and shoulder shrug are intact  MOTOR: There is no pronator drift of out-stretched arms. Muscle bulk and tone are normal. Muscle strength is normal.  REFLEXES: Reflexes are 2+ and symmetric at the biceps, triceps, knees, and ankles. Plantar responses are flexor.  SENSORY: Intact to light touch, pinprick and vibratory sensation are intact in fingers and toes.  COORDINATION: There is no trunk or limb dysmetria noted.  GAIT/STANCE: Posture is normal. Gait is steady with normal steps, base, arm swing, and turning. Heel and toe walking are normal. Tandem gait is normal.  Romberg is absent.  REVIEW OF SYSTEMS:  Full 14 system review of systems performed and notable only for as above All other review of systems were negative.   ALLERGIES: No Known Allergies  HOME MEDICATIONS: Current Outpatient Medications  Medication Sig Dispense Refill   Calcium Carb-Cholecalciferol (CALCIUM + D3 PO) Take by mouth daily.     rizatriptan (MAXALT-MLT) 10 MG disintegrating tablet Take 1 tablet (10 mg total) by mouth as needed. May repeat in 2 hours if needed 12 tablet 6   SUMAtriptan (IMITREX) 50 MG tablet Take 50 mg at the onset of headache; may repeat every 2 hours but no more than 200 mg/24 hours. 10 tablet 1   No current facility-administered medications for this visit.    PAST MEDICAL HISTORY: Past Medical History:  Diagnosis Date   Anemia    Constipation    Depression    Dysrhythmia    GERD (gastroesophageal reflux disease)    Headache    MIGRAINES   Low blood pressure    Lower back pain    Thyroid disease    Varicose vein of leg    BILATERAL    PAST SURGICAL HISTORY: Past Surgical History:  Procedure Laterality Date   DILATATION & CURETTAGE/HYSTEROSCOPY WITH MYOSURE N/A 01/31/2017   Procedure: DILATATION & CURETTAGE/HYSTEROSCOPY WITH MYOSURE;  Surgeon: Terrance Mass, MD;  Location: [facility];   Service: Gynecology;  Laterality: N/A;   DILATATION & CURETTAGE/HYSTEROSCOPY WITH MYOSURE N/A 09/29/2017   Procedure: DILATATION & CURETTAGE/HYSTEROSCOPY WITH MYOSURE RESECTION OF SUBMUCOSAL MYOMA;  Surgeon: Princess Bruins, MD;  Location: [facility];  Service: Gynecology;  Laterality: N/A;  request to follow 2nd case around 10am in [place] block time Requests one hour OR time   HYSTEROSCOPY WITH NOVASURE N/A 09/29/2017   Procedure: [place];  Surgeon: Princess Bruins, MD;  Location: [facility];  Service: Gynecology;  Laterality: N/A;   TUBAL LIGATION      FAMILY HISTORY: Family History  Problem Relation Age of Onset   Cancer Maternal Uncle        COLON   Parkinson's disease Father    Varicose Veins Mother    Colon cancer Neg Hx    Esophageal cancer Neg Hx    Rectal cancer Neg Hx    Stomach cancer Neg Hx  SOCIAL HISTORY: Social History   Socioeconomic History   Marital status: Single    Spouse name: Not on file   Number of children: 2   Years of education: two years college   Highest education level: Not on file  Occupational History   Occupation: warehouse  Tobacco Use   Smoking status: Former    Years: 6.00    Types: Cigarettes   Smokeless tobacco: Never  Vaping Use   Vaping Use: Never used  Substance and Sexual Activity   Alcohol use: Yes    Alcohol/week: 0.0 standard drinks    Comment: OCC   Drug use: Never   Sexual activity: Not Currently  Other Topics Concern   Not on file  Social History Narrative   Lives at home with her daughter.   Right-handed.   Caffeine use: 3 cups per day.   Social Determinants of Health   Financial Resource Strain: Not on file  Food Insecurity: Not on file  Transportation Needs: Not on file  Physical Activity: Not on file  Stress: Not on file  Social Connections: Not on file  Intimate Partner Violence: Not on file      Marcial Pacas, M.D.  Ph.D.  [facility] [address], [place], [place] [address] Ph: [phone] Fax: [phone]  CC:  Horald Pollen, [place],  [place] [address]  Horald Pollen, MD

## 2021-09-15 ENCOUNTER — Telehealth: Payer: Self-pay

## 2021-09-15 NOTE — Telephone Encounter (Signed)
PA started for Aimovig 70 mg on Covermymeds (Key: [id number]). Patient has coverage through [place] (ID#: [id number]). Decision pending.

## 2021-09-21 ENCOUNTER — Encounter: Payer: Self-pay | Admitting: Neurology

## 2021-09-21 NOTE — Telephone Encounter (Signed)
Called [employer] regarding PA for Aimovig 70 mg. PA is handled by third party verify [employer] (Phone #: [phone]; Fax: [phone]). Spoke with Myriam Jacobson at verify [employer]. PA Form can be found on [email or web address] and should be faxed back to verify allegiance. In progress.

## 2021-09-23 NOTE — Telephone Encounter (Addendum)
Her plan sent the incorrect form to our office after telling us there was no open case. Attempted to restart on covermymeds. It returned stating case FQ#[id number] was in progress. This was the original one submitted on 09/15/21. I called [employer] at [phone]. After an extended hold time, the representative said the case was stalled due to additional information being required. I spoke to the pharmacist, completed the case and it was approved through 03/22/2022.   The patient's [employer] plan has her as: Name - Desiree Krueger Correct ID# [id number]

## 2021-10-07 ENCOUNTER — Encounter: Payer: Self-pay | Admitting: Emergency Medicine

## 2021-10-07 ENCOUNTER — Other Ambulatory Visit: Payer: Self-pay | Admitting: Emergency Medicine

## 2021-10-07 DIAGNOSIS — H579 Unspecified disorder of eye and adnexa: Secondary | ICD-10-CM

## 2021-10-07 NOTE — Telephone Encounter (Signed)
She is having some visual problems and is requesting referral to ophthalmologist.  Referral placed today.  Thanks.

## 2021-10-30 IMAGING — MG DIGITAL SCREENING BILAT W/ CAD
4 series · 4 of 4 positions shown · non-contrast
Comparison: None.

CLINICAL DATA: Screening.

EXAM:
DIGITAL SCREENING BILATERAL MAMMOGRAM WITH CAD
TECHNIQUE: Bilateral screening digital craniocaudal and mediolateral oblique
mammograms were obtained. The images were evaluated with
computer-aided detection.

[R MLO]
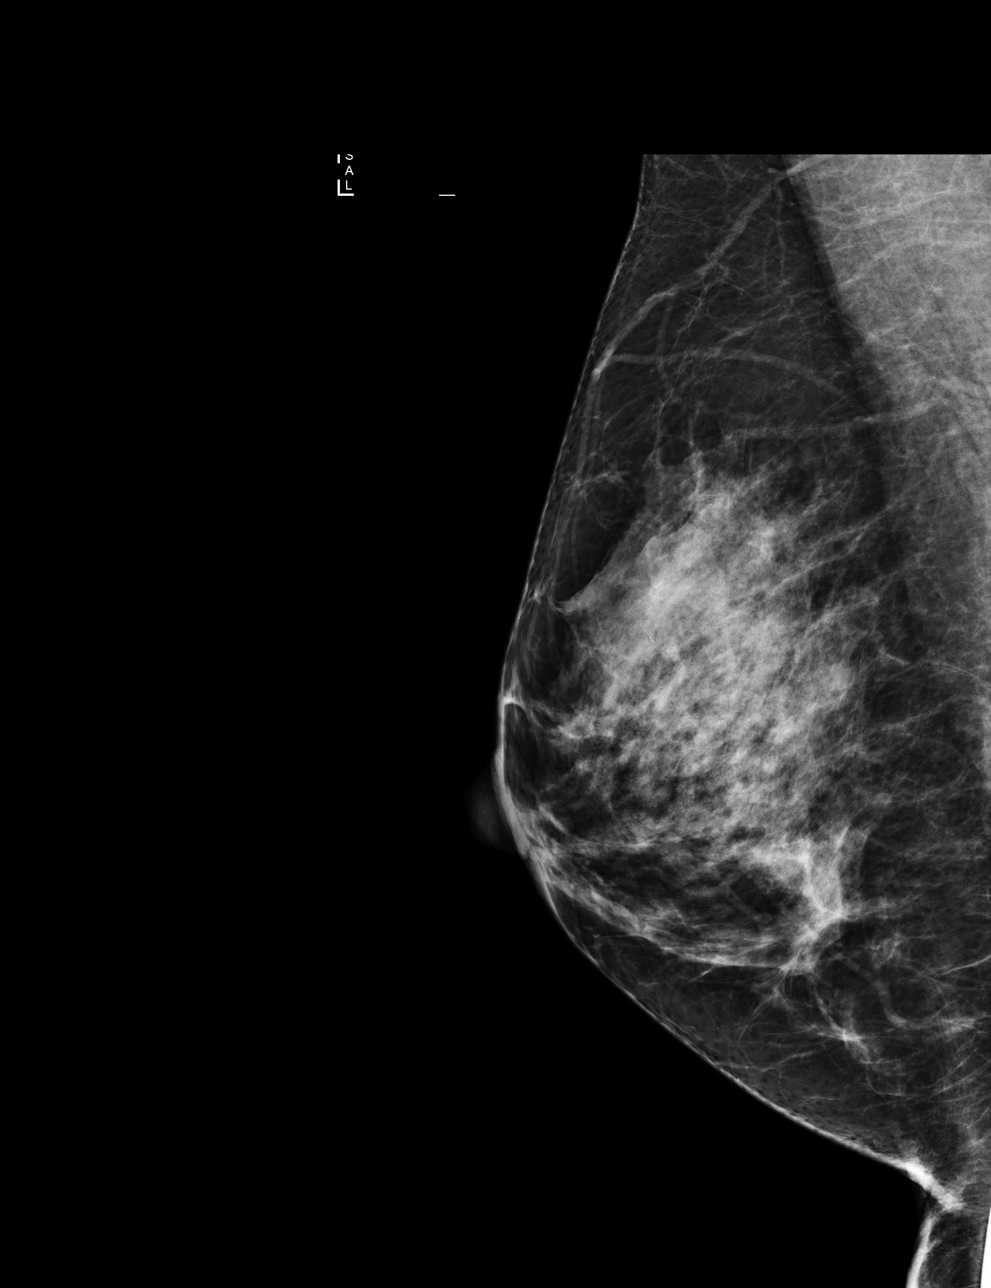

[L CC]
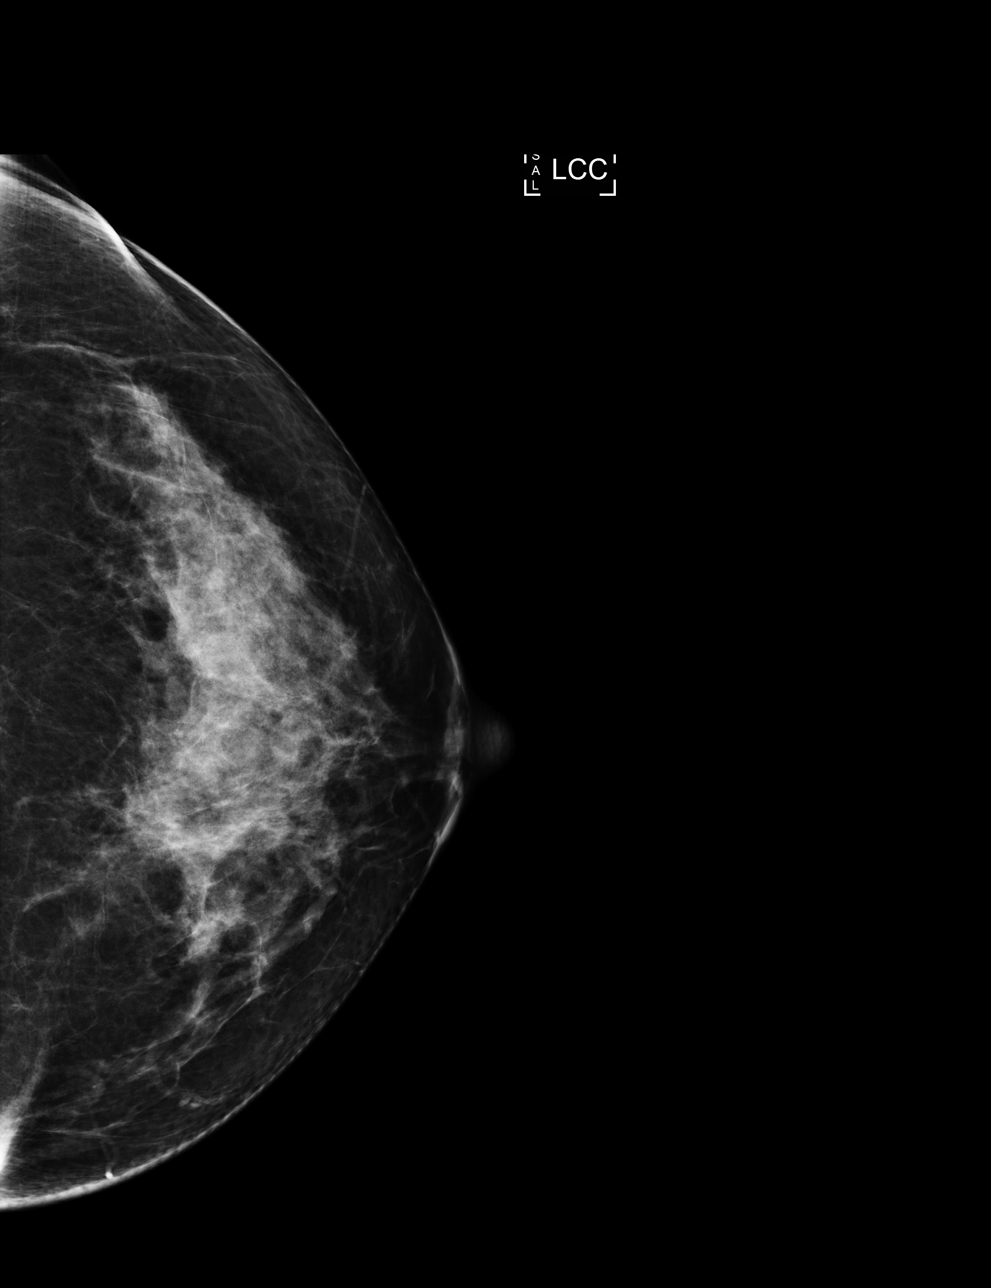

[R CC]
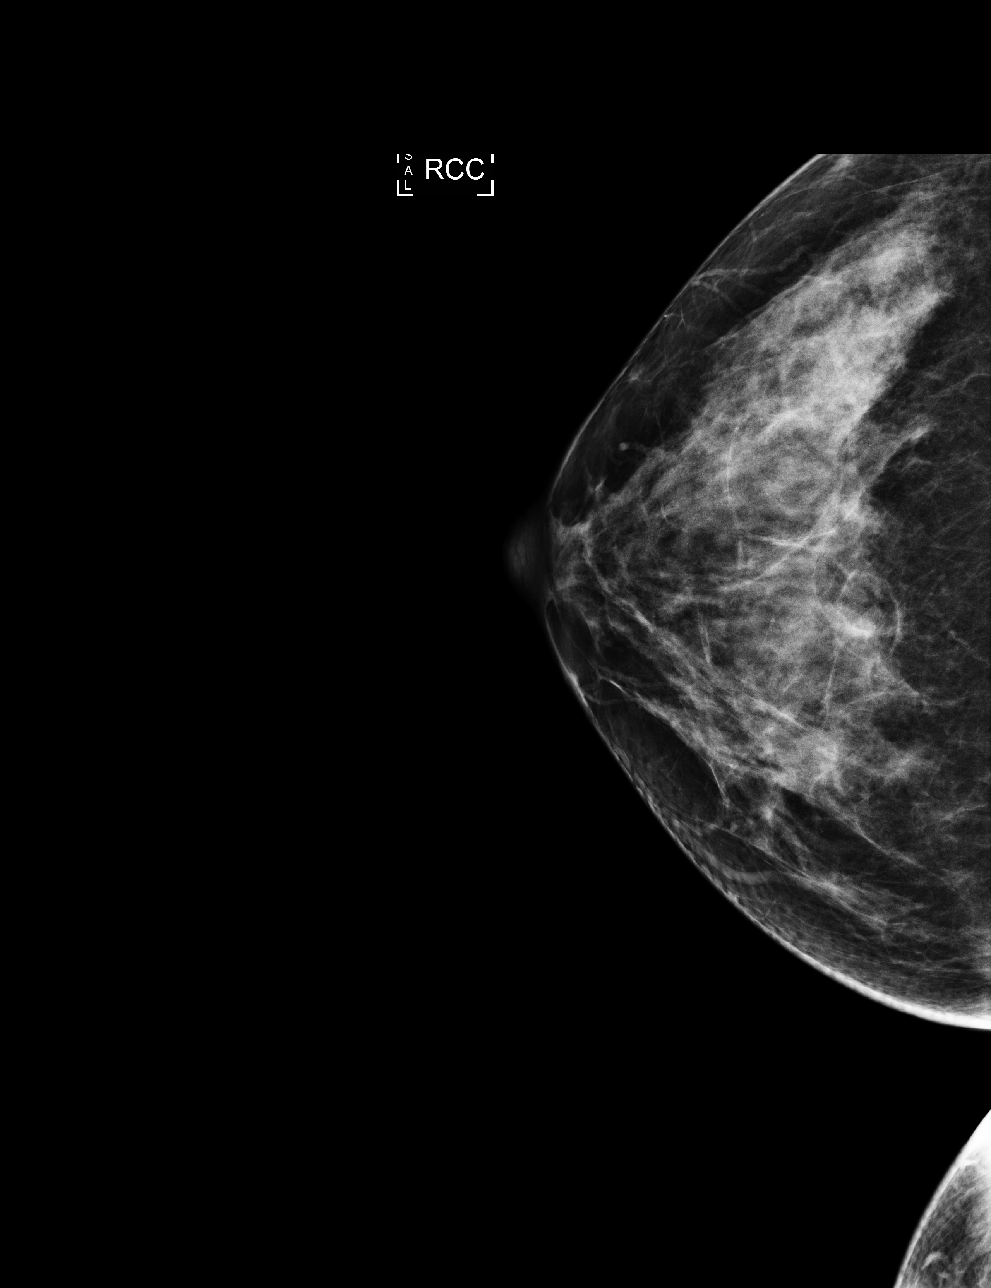

[L MLO]
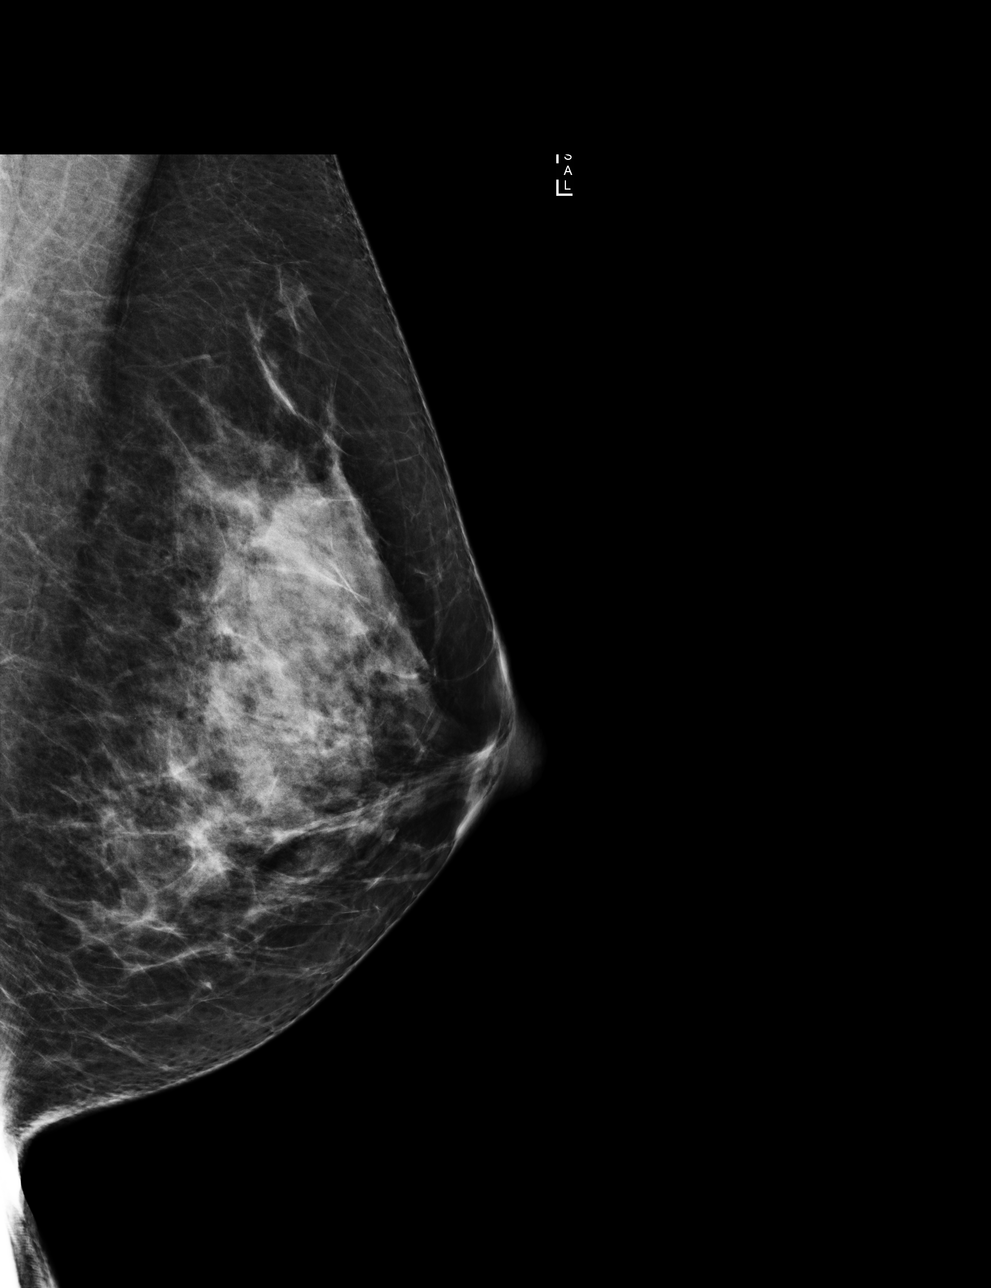

[4 of 4 positions shown; findings below may reference images not displayed]

ACR Breast Density Category c: The breast tissue is heterogeneously
dense, which may obscure small masses
FINDINGS: There are no findings suspicious for malignancy.
IMPRESSION: No mammographic evidence of malignancy. A result letter of this
screening mammogram will be mailed directly to the patient.

RECOMMENDATION:
Screening mammogram in one year. (Code:FP-P-CVD)

BI-RADS CATEGORY  1: Negative.

## 2021-11-03 ENCOUNTER — Encounter: Payer: Self-pay | Admitting: Obstetrics & Gynecology

## 2021-11-03 ENCOUNTER — Other Ambulatory Visit (HOSPITAL_COMMUNITY)
Admission: RE | Admit: 2021-11-03 | Discharge: 2021-11-03 | Disposition: A | Discharge status: Home / Self Care | Payer: Managed Care, Other (non HMO) | Source: Ambulatory Visit | Attending: Obstetrics & Gynecology | Admitting: Obstetrics & Gynecology

## 2021-11-03 ENCOUNTER — Ambulatory Visit (INDEPENDENT_AMBULATORY_CARE_PROVIDER_SITE_OTHER): Payer: 59 | Admitting: Obstetrics & Gynecology

## 2021-11-03 ENCOUNTER — Other Ambulatory Visit: Payer: Self-pay

## 2021-11-03 VITALS — BP 104/64 | HR 68 | Resp 16 | Ht 61.75 in | Wt 134.0 lb

## 2021-11-03 DIAGNOSIS — Z01419 Encounter for gynecological examination (general) (routine) without abnormal findings: Secondary | ICD-10-CM | POA: Insufficient documentation

## 2021-11-03 DIAGNOSIS — Z9851 Tubal ligation status: Secondary | ICD-10-CM | POA: Diagnosis not present

## 2021-11-03 DIAGNOSIS — Z9889 Other specified postprocedural states: Secondary | ICD-10-CM | POA: Diagnosis not present

## 2021-11-03 NOTE — Addendum Note (Signed)
Addended by: Princess Bruins on: 11/03/2021 03:38 PM ? ? Modules accepted: Orders ? ?

## 2021-11-03 NOTE — Progress Notes (Addendum)
? ? ?Desiree Krueger 10/14/1970 [id number] ? ? ?History:    51 y.o. . G4P2A2 S/P Bilateral TL. ?  ?RP:  Established patient presenting for annual gyn exam  ?  ?HPI:  Hysteroscopy resection D&C with NovaSure endometrial ablation in 09/2017.  No menses since then.  No pelvic pain.  No pain with IC.  Pap Neg in 2018.  Pap reflex today. Breasts normal.  Mammo Neg 02/2021.  Urine/BMs normal.  BMI 24.71.  Good fitness.  COLONOSCOPY: 05-03-18.  Health labs with Fam MD.   ? ?Past medical history,surgical history, family history and social history were all reviewed and documented in the EPIC chart. ? ?Gynecologic History ?Patient's last menstrual period was 09/18/2017 (exact date). ? ?Obstetric History ?OB History  ?Gravida Para Term Preterm AB Living  ?4 2     2 2  ?SAB IAB Ectopic Multiple Live Births  ?2          ?  ?# Outcome Date GA Lbr Len/2nd Weight Sex Delivery Anes PTL Lv  ?4 SAB           ?3 SAB           ?2 Para           ?1 Para           ? ? ? ?ROS: A ROS was performed and pertinent positives and negatives are included in the history. ? GENERAL: No fevers or chills. HEENT: No change in vision, no earache, sore throat or sinus congestion. NECK: No pain or stiffness. CARDIOVASCULAR: No chest pain or pressure. No palpitations. PULMONARY: No shortness of breath, cough or wheeze. GASTROINTESTINAL: No abdominal pain, nausea, vomiting or diarrhea, melena or bright red blood per rectum. GENITOURINARY: No urinary frequency, urgency, hesitancy or dysuria. MUSCULOSKELETAL: No joint or muscle pain, no back pain, no recent trauma. DERMATOLOGIC: No rash, no itching, no lesions. ENDOCRINE: No polyuria, polydipsia, no heat or cold intolerance. No recent change in weight. HEMATOLOGICAL: No anemia or easy bruising or bleeding. NEUROLOGIC: No headache, seizures, numbness, tingling or weakness. PSYCHIATRIC: No depression, no loss of interest in normal activity or change in sleep pattern.  ?  ? ?Exam: ? ? ?BP 104/64   Pulse 68    Resp 16   Ht 5' 1.75" (1.568 m)   Wt 134 lb (60.8 kg)   LMP 09/18/2017 (Exact Date) Comment: BTL  BMI 24.71 kg/m?  ? ?Body mass index is 24.71 kg/m?. ? ?General appearance : Well developed well nourished female. No acute distress ?HEENT: Eyes: no retinal hemorrhage or exudates,  Neck supple, trachea midline, no carotid bruits, no thyroidmegaly ?Lungs: Clear to auscultation, no rhonchi or wheezes, or rib retractions  ?Heart: Regular rate and rhythm, no murmurs or gallops ?Breast:Examined in sitting and supine position were symmetrical in appearance, no palpable masses or tenderness,  no skin retraction, no nipple inversion, no nipple discharge, no skin discoloration, no axillary or supraclavicular lymphadenopathy ?Abdomen: no palpable masses or tenderness, no rebound or guarding ?Extremities: no edema or skin discoloration or tenderness ? ?Pelvic: Vulva: Normal ?            Vagina: No gross lesions or discharge ? Cervix: No gross lesions or discharge.  Pap reflex done. ? Uterus  AV, normal size, shape and consistency, non-tender and mobile ? Adnexa  Without masses or tenderness ? Anus: Normal ? ? ?Assessment/Plan:  51 y.o. female for annual exam  ? ?1. Encounter for routine gynecological examination with Papanicolaou smear of   cervix ?Hysteroscopy resection D&C with NovaSure endometrial ablation in 09/2017.  No menses since then.  No pelvic pain.  No pain with IC.  Pap Neg in 2018.  Pap reflex today. Breasts normal.  Mammo Neg 02/2021.  Urine/BMs normal.  BMI 24.71.  Good fitness.  COLONOSCOPY: 05-03-18.  Health labs with Fam MD.   ?- Cytology - PAP( [place]) ?- CBC; Future ?- Comp Met (CMET); Future ?- TSH; Future ?- Lipid Profile; Future ?- Vitamin D (25 hydroxy); Future ? ?2. S/P tubal ligation ? ?3. S/P endometrial ablation  ?Amenorrhea x ablation. ? ?Princess Bruins MD, 3:17 PM 11/03/2021 ? ?  ?

## 2021-11-05 LAB — CYTOLOGY - PAP: Diagnosis: NEGATIVE

## 2021-11-12 ENCOUNTER — Other Ambulatory Visit: Payer: 59

## 2021-11-12 DIAGNOSIS — Z01419 Encounter for gynecological examination (general) (routine) without abnormal findings: Secondary | ICD-10-CM

## 2021-11-13 LAB — CBC
HCT: 41.2 % (ref 35.0–45.0)
Hemoglobin: 14 g/dL (ref 11.7–15.5)
MCH: 31.8 pg (ref 27.0–33.0)
MCHC: 34 g/dL (ref 32.0–36.0)
MCV: 93.6 fL (ref 80.0–100.0)
MPV: 11 fL (ref 7.5–12.5)
Platelets: 198 10*3/uL (ref 140–400)
RBC: 4.4 10*6/uL (ref 3.80–5.10)
RDW: 12.4 % (ref 11.0–15.0)
WBC: 4.5 10*3/uL (ref 3.8–10.8)

## 2021-11-13 LAB — COMPREHENSIVE METABOLIC PANEL
AG Ratio: 1.5 (calc) (ref 1.0–2.5)
ALT: 51 U/L — ABNORMAL HIGH (ref 6–29)
AST: 29 U/L (ref 10–35)
Albumin: 4.3 g/dL (ref 3.6–5.1)
Alkaline phosphatase (APISO): 85 U/L (ref 37–153)
BUN: 16 mg/dL (ref 7–25)
CO2: 30 mmol/L (ref 20–32)
Calcium: 9.5 mg/dL (ref 8.6–10.4)
Chloride: 102 mmol/L (ref 98–110)
Creat: 0.99 mg/dL (ref 0.50–1.03)
Globulin: 2.8 g/dL (calc) (ref 1.9–3.7)
Glucose, Bld: 86 mg/dL (ref 65–99)
Potassium: 4.6 mmol/L (ref 3.5–5.3)
Sodium: 138 mmol/L (ref 135–146)
Total Bilirubin: 0.6 mg/dL (ref 0.2–1.2)
Total Protein: 7.1 g/dL (ref 6.1–8.1)

## 2021-11-13 LAB — TSH: TSH: 1.74 mIU/L

## 2021-11-13 LAB — LIPID PANEL
Cholesterol: 190 mg/dL (ref <200)
HDL: 65 mg/dL (ref >=50)
LDL Cholesterol (Calc): 109 mg/dL (calc) — ABNORMAL HIGH
Non-HDL Cholesterol (Calc): 125 mg/dL (calc) (ref <130)
Total CHOL/HDL Ratio: 2.9 (calc) (ref <5.0)
Triglycerides: 69 mg/dL (ref <150)

## 2021-11-13 LAB — VITAMIN D 25 HYDROXY (VIT D DEFICIENCY, FRACTURES): Vit D, 25-Hydroxy: 28 ng/mL — ABNORMAL LOW (ref 30–100)

## 2021-11-24 ENCOUNTER — Other Ambulatory Visit: Payer: Self-pay | Admitting: Anesthesiology

## 2021-11-24 DIAGNOSIS — R748 Abnormal levels of other serum enzymes: Secondary | ICD-10-CM

## 2021-12-15 ENCOUNTER — Ambulatory Visit (INDEPENDENT_AMBULATORY_CARE_PROVIDER_SITE_OTHER): Payer: 59 | Admitting: Adult Health

## 2021-12-15 ENCOUNTER — Encounter: Payer: Self-pay | Admitting: Adult Health

## 2021-12-15 VITALS — BP 116/64 | HR 94 | Ht 61.75 in | Wt 136.0 lb

## 2021-12-15 DIAGNOSIS — G43709 Chronic migraine without aura, not intractable, without status migrainosus: Secondary | ICD-10-CM

## 2021-12-15 MED ORDER — TIZANIDINE HCL 4 MG PO TABS: 4.0000 mg | ORAL_TABLET | Freq: Four times a day (QID) | ORAL | 6 refills | Status: DC | PRN

## 2021-12-15 MED ORDER — VENLAFAXINE HCL ER 37.5 MG PO CP24: 37.5000 mg | ORAL_CAPSULE | Freq: Every day | ORAL | 5 refills | Status: DC

## 2021-12-15 NOTE — Progress Notes (Signed)
?[facility] ?[address] ?[place]. [place] [address] ?[phone] ? ?     OFFICE FOLLOW UP NOTE ? ?[name] ?Date of Birth:  [birth year] ?Medical Record Number:  [id number]  ? ? ?Primary neurologist: Dr. Krista Krueger ?Reason for visit: Migraine follow-up ? ? ? ?SUBJECTIVE: ? ? ?CHIEF COMPLAINT:  ?Chief Complaint  ?Patient presents with  ? Follow-up  ?  Rm 2 with interpreter here for 3 month f/u. Pt reports overall she has been doing well wanted to discuss possibly lowering the Effexor dosage since she has been doing well and wanted to review MRI resutls from last year.   ? ? ?HPI:  ? ?Update 12/15/2021 JM: Patient returns for follow-up visit after prior visit 09/13/2021 with Dr. Krista Krueger.  She is accompanied by interpreter. Migraines have greatly improved since prior visit. Reports about 1 migraine per month over the past couple of months. She continues on effexor '75mg'$  daily.  She questions decreasing dosage  as she feels she has had increased anxiety and tremulousness since starting.  She is otherwise tolerating without side effects. Unable to start CRG P injection as previously recommended due to price. Reports starting butterbur and Vit B2 a couple weeks ago with further improvement of her migraines (has mild headache today but no migraine over the past 3 weeks). Will use rizatriptan with benefit and occasionally will need to repeat rizatriptan in addition to tizanidine. Use of Zofran as needed.  She has questions regarding results of MRI that was completed 1 year ago. She does not further look into family history and has found extensive hx of migraines in her family.  no further concerns at this time. ? ? ? ? ? ?History copied from Dr. Rhea Krueger prior [place] note for reference purposes only ?Desiree Krueger is a 51 year old female, seen in request by her primary care physician Dr.   Mitchel Krueger, [facility], for evaluation of frequent headaches, initial evaluation was on Dec 24, 2020 ?  ?  ?I  reviewed and summarized the referring note. PMHx. ?  ?She reported a long history of chronic migraine headaches, started in her 3s, previous headache was very severe, over the years, she was able to identify triggers, weather change, stress, strong smells, bright light, excessive coffee intake, spicy food, cheese, red wine ?  ?She has made major changes in her diet, with some improvement of her headache, but she still has 4 headaches days in a week, she described lateralized, sometimes holoacranial pressure headaches, evolving to severe pounding headache with light noise smell sensitivity, lasting for hours ?  ?For a while, she was using frequent Excedrin Migraine almost daily basis, in February 2022, she was giving prescription of Imitrex 50 mg as needed, she used up her monthly supply of 9 tablets, sometimes has to buy extra, and laid back on her over-the-counter medication ?  ?She never tried preventive medication in the past, her brother also suffered migraine ?  ?She denies visual loss, father passed away with Parkinson's disease, mother suffered dementia ?  ?With her frequent headaches, also complains of emotional outburst, her mood switch quickly' ?  ?UPDATE Sep 13 2021: ?She is no longer taking Nortriptyline, up to '20mg'$  qhs, did not help her headache much, cause constipation, Maxalt prn was helpful, up to 2 tabs/one day up to 4 days a week, her headache usually improve after 1 hour, she is also taking magnesium and riboflavin  ?  ?She continues to complains of mood change, depression, anxiety, in  tear today. She has to stay in dark all the time. ?  ?Personally reviewed MRI of the brain without contrast in May 2022: Mild small vessel disease, no acute abnormality ? ? ? ? ? ? ?ROS:   ?14 system review of systems performed and negative with exception of this in HPI ? ?PMH:  ?Past Medical History:  ?Diagnosis Date  ? Anemia   ? Constipation   ? Depression   ? Dysrhythmia   ? GERD (gastroesophageal reflux  disease)   ? Headache   ? MIGRAINES  ? Low blood pressure   ? Lower back pain   ? Thyroid disease   ? 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 ? ?PSH:  ?Past Surgical History:  ?Procedure Laterality Date  ? DILATATION & CURETTAGE/HYSTEROSCOPY WITH MYOSURE N/A 01/31/2017  ? Procedure: DILATATION & CURETTAGE/HYSTEROSCOPY WITH MYOSURE;  Surgeon: Terrance Mass, MD;  Location: [facility];  Service: Gynecology;  Laterality: N/A;  ? DILATATION & CURETTAGE/HYSTEROSCOPY WITH MYOSURE N/A 09/29/2017  ? Procedure: DILATATION & CURETTAGE/HYSTEROSCOPY WITH MYOSURE RESECTION OF SUBMUCOSAL MYOMA;  Surgeon: Princess Bruins, MD;  Location: [facility];  Service: Gynecology;  Laterality: N/A;  request to follow 2nd case around 10am in [place] block time ?Requests one hour OR time  ? HYSTEROSCOPY WITH NOVASURE N/A 09/29/2017  ? Procedure: HYSTEROSCOPY WITH NOVASURE ENDOMETRIAL ABLATION;  Surgeon: Princess Bruins, MD;  Location: [facility];  Service: Gynecology;  Laterality: N/A;  ? TUBAL LIGATION    ? ? ?Social History:  ?Social History  ? ?Socioeconomic History  ? Marital status: Single  ?  Spouse name: Not on file  ? Number of children: 2  ? Years of education: two years college  ? Highest education level: Not on file  ?Occupational History  ? Occupation: warehouse  ?Tobacco Use  ? Smoking status: Former  ?  Years: 6.00  ?  Types: Cigarettes  ? Smokeless tobacco: Never  ?Vaping Use  ? Vaping Use: Never used  ?Substance and Sexual Activity  ? Alcohol use: Not on file  ? Drug use: Never  ? Sexual activity: Yes  ?  Partners: Male  ?  Birth control/protection: Post-menopausal, Surgical  ?  Comment: BTL  ?Other Topics Concern  ? Not on file  ?Social History Narrative  ? Lives at home with her daughter.  ? Right-handed.  ? Caffeine use: 3 cups per day.  ? ?Social Determinants of Health  ? ?Financial Resource Strain: Not on file  ?Food Insecurity: Not on file  ?Transportation Needs: Not on file   ?Physical Activity: Not on file  ?Stress: Not on file  ?Social Connections: Not on file  ?Intimate Partner Violence: Not on file  ? ? ?Family History:  ?Family History  ?Problem Relation Age of Onset  ? Varicose Veins Mother   ? Dementia Mother   ? Parkinson's disease Father   ? Cancer Maternal Uncle   ?     COLON  ? ? ?Medications:   ?Current Outpatient Medications on File Prior to Visit  ?Medication Sig Dispense Refill  ? Calcium Carb-Cholecalciferol (CALCIUM + D3 PO) Take by mouth daily.    ? NON FORMULARY Butterbur    ? ondansetron (ZOFRAN-ODT) 4 MG disintegrating tablet Take 1 tablet (4 mg total) by mouth every 8 (eight) hours as needed for nausea or vomiting. 20 tablet 6  ? RIBOFLAVIN PO Take by mouth.    ? rizatriptan (MAXALT-MLT) 10 MG disintegrating tablet Take 1  tablet (10 mg total) by mouth as needed. May repeat in 2 hours if needed 12 tablet 11  ? venlafaxine XR (EFFEXOR XR) 37.5 MG 24 hr capsule Take 2 capsules (75 mg total) by mouth daily with breakfast. 60 capsule 11  ? tiZANidine (ZANAFLEX) 4 MG tablet Take 1 tablet (4 mg total) by mouth every 6 (six) hours as needed for muscle spasms. (Patient not taking: Reported on 12/15/2021) 30 tablet 6  ? ?No current facility-administered medications on file prior to visit.  ? ? ?Allergies:  No Known Allergies ? ? ? ?OBJECTIVE: ? ?Physical Exam ? ?Vitals:  ? 12/15/21 1440  ?BP: 116/64  ?Pulse: 94  ?SpO2: 95%  ?Weight: 136 lb (61.7 kg)  ?Height: 5' 1.75" (1.568 m)  ? ?Body mass index is 25.08 kg/m?Marland Kitchen ?No results found. ? ?General: well developed, well nourished, very pleasant middle-age female, seated, in no evident distress ?Head: head normocephalic and atraumatic.   ?Neck: supple with no carotid or supraclavicular bruits ?Cardiovascular: regular rate and rhythm, no murmurs ?Musculoskeletal: no deformity ?Skin:  no rash/petichiae ?Vascular:  Normal pulses all extremities ?  ?Neurologic Exam ?Mental Status: Awake and fully alert.  Limited English but unable to  appreciate aphasia or dysarthria.  Oriented to place and time. Recent and remote memory intact. Attention span, concentration and fund of knowledge appropriate. Mood and affect appropriate.  ?Cranial Nerves:  Pupils equal

## 2021-12-15 NOTE — Patient Instructions (Addendum)
Can try to decrease effexor dosage down to 37.'5mg'$  daily (only 1 capsule) for at least 1 month. If still doing okay without any headaches, you can try to discontinue  ? ?Continue rizatriptan, zofran and tizanidine for abortive therapy  ? ?Can try "Migrelief" supplement (has feverfew, magnesium and b vitamin all in one capsule)  ? ? ? ? ?Follow up in 6 months or call earlier if needed ? ?

## 2022-02-16 ENCOUNTER — Other Ambulatory Visit: Payer: 59

## 2022-03-07 ENCOUNTER — Other Ambulatory Visit: Payer: 59

## 2022-03-07 DIAGNOSIS — R748 Abnormal levels of other serum enzymes: Secondary | ICD-10-CM

## 2022-03-08 LAB — ALT: ALT: 29 U/L (ref 6–29)

## 2022-03-08 LAB — AST: AST: 22 U/L (ref 10–35)

## 2022-04-21 ENCOUNTER — Other Ambulatory Visit: Payer: Self-pay | Admitting: Emergency Medicine

## 2022-04-21 DIAGNOSIS — Z1231 Encounter for screening mammogram for malignant neoplasm of breast: Secondary | ICD-10-CM

## 2022-05-10 ENCOUNTER — Ambulatory Visit
Admission: RE | Admit: 2022-05-10 | Discharge: 2022-05-10 | Disposition: A | Discharge status: Home / Self Care | Payer: Managed Care, Other (non HMO) | Source: Ambulatory Visit | Attending: Emergency Medicine | Admitting: Emergency Medicine

## 2022-05-10 DIAGNOSIS — Z1231 Encounter for screening mammogram for malignant neoplasm of breast: Secondary | ICD-10-CM

## 2022-06-22 NOTE — Progress Notes (Unsigned)
[facility] [address] [place]. [place] [address] [phone]       OFFICE FOLLOW UP NOTE  [name] Date of Birth:  1971/08/07 Medical Record Number:  [id number]    Primary neurologist: Dr. Krista Blue Reason for visit: Migraine follow-up    SUBJECTIVE:   CHIEF COMPLAINT:  No chief complaint on file.   HPI:   Update 06/23/2022 JM: Patient returns for 37-monthmigraine follow-up.      History provided for reference purposes only Update 12/15/2021 JM: Patient returns for follow-up visit after prior visit 09/13/2021 with Dr. YKrista Blue  She is accompanied by interpreter. Migraines have greatly improved since prior visit. Reports about 1 migraine per month over the past couple of months. She continues on effexor '75mg'$  daily.  She questions decreasing dosage  as she feels she has had increased anxiety and tremulousness since starting.  She is otherwise tolerating without side effects. Unable to start CRG P injection as previously recommended due to price. Reports starting butterbur and Vit B2 a couple weeks ago with further improvement of her migraines (has mild headache today but no migraine over the past 3 weeks). Will use rizatriptan with benefit and occasionally will need to repeat rizatriptan in addition to tizanidine. Use of Zofran as needed.  She has questions regarding results of MRI that was completed 1 year ago. She does not further look into family history and has found extensive hx of migraines in her family.  no further concerns at this time.   History copied from Dr. YRhea Beltonprior OAccovillenote for reference purposes only DJolyne Layeis a 51year old female, seen in request by her primary care physician Dr.   [facility], initial evaluation was on Dec 24, 2020     I reviewed and summarized the referring note. PMHx.   She reported a long history of chronic migraine headaches, started in her 350s  previous headache was very severe, over the years, she was able to identify triggers, weather change, stress, strong smells, bright light, excessive coffee intake, spicy food, cheese, red wine   She has made major changes in her diet, with some improvement of her headache, but she still has 4 headaches days in a week, she described lateralized, sometimes holoacranial pressure headaches, evolving to severe pounding headache with light noise smell sensitivity, lasting for hours   For a while, she was using frequent Excedrin Migraine almost daily basis, in February 2022, she was giving prescription of Imitrex 50 mg as needed, she used up her monthly supply of 9 tablets, sometimes has to buy extra, and laid back on her over-the-counter medication   She never tried preventive medication in the past, her brother also suffered migraine   She denies visual loss, father passed away with Parkinson's disease, mother suffered dementia   With her frequent headaches, also complains of emotional outburst, her mood switch quickly'   UPDATE Sep 13 2021: She is no longer taking Nortriptyline, up to '20mg'$  qhs, did not help her headache much, cause constipation, Maxalt prn was helpful, up to 2 tabs/one day up to 4 days a week, her headache usually improve after 1 hour, she is also taking magnesium and riboflavin    She continues to complains of mood change, depression, anxiety, in tear today. She has to stay in dark all the time.   Personally reviewed MRI of the brain without contrast in May 2022: Mild small vessel disease, no acute abnormality  ROS:   14 system review of systems performed and negative with exception of this in HPI  PMH:  Past Medical History:  Diagnosis Date   Anemia    Constipation    Depression    Dysrhythmia    GERD (gastroesophageal reflux disease)    Headache    MIGRAINES   Low blood pressure    Lower back pain    Thyroid disease    Varicose vein of leg    BILATERAL     PSH:  Past Surgical History:  Procedure Laterality Date   DILATATION & CURETTAGE/HYSTEROSCOPY WITH MYOSURE N/A 01/31/2017   Procedure: DILATATION & CURETTAGE/HYSTEROSCOPY WITH MYOSURE;  Surgeon: Terrance Mass, MD;  Location: [facility];  Service: Gynecology;  Laterality: N/A;   DILATATION & CURETTAGE/HYSTEROSCOPY WITH MYOSURE N/A 09/29/2017   Procedure: DILATATION & CURETTAGE/HYSTEROSCOPY WITH MYOSURE RESECTION OF SUBMUCOSAL MYOMA;  Surgeon: Princess Bruins, MD;  Location: [facility];  Service: Gynecology;  Laterality: N/A;  request to follow 2nd case around 10am in [place] block time Requests one hour OR time   HYSTEROSCOPY WITH NOVASURE N/A 09/29/2017   Procedure: [place];  Surgeon: Princess Bruins, MD;  Location: [place];  Service: Gynecology;  Laterality: N/A;   TUBAL LIGATION      Social History:  Social History   Socioeconomic History   Marital status: Single    Spouse name: Not on file   Number of children: 2   Years of education: two years college   Highest education level: Not on file  Occupational History   Occupation: warehouse  Tobacco Use   Smoking status: Former    Years: 6.00    Types: Cigarettes   Smokeless tobacco: Never  Scientific laboratory technician Use: Never used  Substance and Sexual Activity   Alcohol use: Not on file   Drug use: Never   Sexual activity: Yes    Partners: Male    Birth control/protection: Post-menopausal, Surgical    Comment: BTL  Other Topics Concern   Not on file  Social History Narrative   Lives at home with her daughter.   Right-handed.   Caffeine use: 3 cups per day.   Social Determinants of Health   Financial Resource Strain: Not on file  Food Insecurity: Not on file  Transportation Needs: Not on file  Physical Activity: Not on file  Stress: Not on file  Social Connections: Not on file  Intimate Partner Violence: Not on file    Family  History:  Family History  Problem Relation Age of Onset   Varicose Veins Mother    Dementia Mother    Parkinson's disease Father    Cancer Maternal Uncle        COLON    Medications:   Current Outpatient Medications on File Prior to Visit  Medication Sig Dispense Refill   Calcium Carb-Cholecalciferol (CALCIUM + D3 PO) Take by mouth daily.     NON FORMULARY Butterbur     ondansetron (ZOFRAN-ODT) 4 MG disintegrating tablet Take 1 tablet (4 mg total) by mouth every 8 (eight) hours as needed for nausea or vomiting. 20 tablet 6   RIBOFLAVIN PO Take by mouth.     rizatriptan (MAXALT-MLT) 10 MG disintegrating tablet Take 1 tablet (10 mg total) by mouth as needed. May repeat in 2 hours if needed 12 tablet 11   tiZANidine (ZANAFLEX) 4 MG tablet Take 1 tablet (4 mg total) by mouth every 6 (six) hours  as needed for muscle spasms. 30 tablet 6   venlafaxine XR (EFFEXOR XR) 37.5 MG 24 hr capsule Take 1 capsule (37.5 mg total) by mouth daily with breakfast. 30 capsule 5   No current facility-administered medications on file prior to visit.    Allergies:  No Known Allergies    OBJECTIVE:  Physical Exam  There were no vitals filed for this visit.  There is no height or weight on file to calculate BMI. No results found.  General: well developed, well nourished, very pleasant middle-age female, seated, in no evident distress Head: head normocephalic and atraumatic.   Neck: supple with no carotid or supraclavicular bruits Cardiovascular: regular rate and rhythm, no murmurs Musculoskeletal: no deformity Skin:  no rash/petichiae Vascular:  Normal pulses all extremities   Neurologic Exam Mental Status: Awake and fully alert.  Limited English but unable to appreciate aphasia or dysarthria.  Oriented to place and time. Recent and remote memory intact. Attention span, concentration and fund of knowledge appropriate. Mood and affect appropriate.  Cranial Nerves:  Pupils equal, briskly reactive to  light. Extraocular movements full without nystagmus. Visual fields full to confrontation. Hearing intact. Facial sensation intact. Face, tongue, palate moves normally and symmetrically.  Motor: Normal bulk and tone. Normal strength in all tested extremity muscles Sensory.: intact to touch , pinprick , position and vibratory sensation.  Coordination: Rapid alternating movements normal in all extremities. Finger-to-nose and heel-to-shin performed accurately bilaterally. Gait and Station: Arises from chair without difficulty. Stance is normal. Gait demonstrates normal stride length and balance without use of AD. Tandem walk and heel toe without difficulty.  Reflexes: 1+ and symmetric. Toes downgoing.         ASSESSMENT: Desiree Krueger is a 51 y.o. year old female chronic migraine headaches which have significantly improved since starting venlafaxine and additional benefit after vit b2 and butterbur supplement.      PLAN:  Chronic migraine headaches:   Preventative: can try to decrease venlafaxine dosage to 37.5 mg due to concern of increased anxiety since starting. Advised after 1 month, if no additional headaches but continues to have increase anxiety, can completely stop.  Continue supplementation.  Acute: Continue rizatriptan, Zofran and tizanidine as needed for acute management   Tried/failed: Nortriptyline, sumatriptan, Effexor, rizatriptan, Zofran, tizanidine   Follow up in 6 months or call earlier if needed   CC:  PCP: Horald Pollen, MD    I spent 24 minutes of face-to-face and non-face-to-face time with patient assisted by interpreter.  This included previsit chart review, lab review, study review, order entry, electronic health record documentation, patient education and discussion regarding chronic migraine headaches and again reviewed prior MRI, current treatment regimen and further options concerns as noted above and answered all other questions to patient  satisfaction   Frann Rider, AGNP-BC  [facility] [address] [place], [place] [address]  Phone [phone] Fax [phone] Note: This document was prepared with digital dictation and possible smart phrase technology. Any transcriptional errors that result from this process are unintentional.

## 2022-06-23 ENCOUNTER — Ambulatory Visit (INDEPENDENT_AMBULATORY_CARE_PROVIDER_SITE_OTHER): Payer: Managed Care, Other (non HMO) | Admitting: Adult Health

## 2022-06-23 ENCOUNTER — Ambulatory Visit: Payer: Managed Care, Other (non HMO)

## 2022-06-23 ENCOUNTER — Encounter: Payer: Self-pay | Admitting: Adult Health

## 2022-06-23 ENCOUNTER — Encounter: Payer: Self-pay | Admitting: Nurse Practitioner

## 2022-06-23 ENCOUNTER — Other Ambulatory Visit: Payer: Self-pay | Admitting: Nurse Practitioner

## 2022-06-23 ENCOUNTER — Ambulatory Visit (INDEPENDENT_AMBULATORY_CARE_PROVIDER_SITE_OTHER): Payer: Managed Care, Other (non HMO) | Admitting: Nurse Practitioner

## 2022-06-23 VITALS — BP 100/64 | HR 83 | Temp 98.0°F | Ht 61.75 in | Wt 141.1 lb

## 2022-06-23 VITALS — BP 99/55 | HR 89 | Ht 60.0 in | Wt 139.0 lb

## 2022-06-23 DIAGNOSIS — G43709 Chronic migraine without aura, not intractable, without status migrainosus: Secondary | ICD-10-CM | POA: Diagnosis not present

## 2022-06-23 DIAGNOSIS — K5909 Other constipation: Secondary | ICD-10-CM

## 2022-06-23 DIAGNOSIS — E876 Hypokalemia: Secondary | ICD-10-CM

## 2022-06-23 DIAGNOSIS — K5904 Chronic idiopathic constipation: Secondary | ICD-10-CM | POA: Diagnosis not present

## 2022-06-23 DIAGNOSIS — R42 Dizziness and giddiness: Secondary | ICD-10-CM | POA: Diagnosis not present

## 2022-06-23 LAB — BASIC METABOLIC PANEL
BUN: 13 mg/dL (ref 6–23)
CO2: 29 mEq/L (ref 19–32)
Calcium: 9.1 mg/dL (ref 8.4–10.5)
Chloride: 100 mEq/L (ref 96–112)
Creatinine, Ser: 0.83 mg/dL (ref 0.40–1.20)
GFR: 81.59 mL/min (ref >60.00)
Glucose, Bld: 85 mg/dL (ref 70–99)
Potassium: 3.4 mEq/L — ABNORMAL LOW (ref 3.5–5.1)
Sodium: 138 mEq/L (ref 135–145)

## 2022-06-23 LAB — HEMOCCULT GUIAC POC 1CARD (OFFICE): Fecal Occult Blood, POC: NEGATIVE

## 2022-06-23 LAB — TSH: TSH: 2.18 u[IU]/mL (ref 0.35–5.50)

## 2022-06-23 MED ORDER — TIZANIDINE HCL 4 MG PO TABS: 4.0000 mg | ORAL_TABLET | Freq: Four times a day (QID) | ORAL | 11 refills | Status: DC | PRN

## 2022-06-23 MED ORDER — ONDANSETRON 4 MG PO TBDP: 4.0000 mg | ORAL_TABLET | Freq: Three times a day (TID) | ORAL | 11 refills | Status: DC | PRN

## 2022-06-23 MED ORDER — POTASSIUM CHLORIDE CRYS ER 10 MEQ PO TBCR: 10.0000 meq | EXTENDED_RELEASE_TABLET | Freq: Every day | ORAL | 2 refills | Status: DC

## 2022-06-23 MED ORDER — RIZATRIPTAN BENZOATE 10 MG PO TBDP: 10.0000 mg | ORAL_TABLET | ORAL | 11 refills | Status: DC | PRN

## 2022-06-23 NOTE — Addendum Note (Signed)
Addended by: Jeralyn Ruths E on: 06/23/2022 12:00 PM   Modules accepted: Orders

## 2022-06-23 NOTE — Assessment & Plan Note (Addendum)
Chronic, overall physical exam reassuring.  Will order x-ray for further evaluation of her abdomen.  We will also order BMP and TSH for other causes of possible constipation.  Referral to GI placed today as well.  Pending these results we will consider treatment with daily MiraLAX until she is able to be evaluated with GI.  I also did discuss this patient case with her primary care provider Dr. Mitchel Honour who agrees with recommendations based on the report of her concerns and exam.

## 2022-06-23 NOTE — Patient Instructions (Addendum)
Will hold off on daily preventative medication but if headaches should worsen, please let me know  If you would like to change rescue medication in the future, please let me know   Can try "Migrelief" supplement (has feverfew, magnesium and b vitamin all in one capsule)   Please increase water intake as this could be contributing to dizziness, if symptoms persist, please follow up with your primary doctor for further evaluation      Follow up in 9 months or call earlier if needed

## 2022-06-23 NOTE — Progress Notes (Addendum)
Established Patient Office Visit  Subjective   Patient ID: Desiree Krueger, female    DOB: June 27, 1971  Age: 51 y.o. MRN: [id number]  Chief Complaint  Patient presents with   Constipation   Interpreter present during patient encounter.  Patient has today for the above.  Reports she has dealt with constipation for approximately 14 years.  Over the last 2 weeks it seems to have gotten acutely worse.  She reports last bowel movement was yesterday but she feels like she is not emptying her bowels completely.  Also reports that she has fecal urgency after eating a meal, and is experiencing pain with defecation. Reports that bowel movements are liquidy/soft when they do occur.  Has recently tried herbal teas, increasing water intake, increasing fruit, and increasing fiber intake.  Does report history of having tried senna and MiraLAX in the past.  Also reports having been evaluated by gastroenterology in the past.   Per chart review I do see that she was evaluated by Dr. Loletha Carrow, last office it was October 2019 at which point she was recommended to trial Linzess.  She was asked to follow-up to see how she is tolerating medication but I do not see where follow-up occurred.  Prior to this office visit she underwent colonoscopy and upper endoscopy with no abnormal findings noted.  She was ultimately diagnosed with chronic idiopathic constipation.    Review of Systems  Gastrointestinal:  Positive for constipation and nausea. Negative for vomiting.      Objective:     BP 100/64   Pulse 83   Temp 98 F (36.7 C) (Oral)   Ht 5' 1.75" (1.568 m)   Wt 141 lb 2 oz (64 kg)   LMP 09/18/2017 (Exact Date) Comment: BTL  SpO2 95%   BMI 26.02 kg/m    Physical Exam Vitals reviewed.  Constitutional:      General: She is not in acute distress.    Appearance: Normal appearance.  HENT:     Head: Normocephalic and atraumatic.  Neck:     Vascular: No carotid bruit.  Cardiovascular:     Rate and  Rhythm: Normal rate and regular rhythm.     Pulses: Normal pulses.     Heart sounds: Normal heart sounds.  Pulmonary:     Effort: Pulmonary effort is normal.     Breath sounds: Normal breath sounds.  Abdominal:     General: Abdomen is flat. Bowel sounds are normal. There is no distension.     Palpations: Abdomen is soft. There is no mass.     Tenderness: There is abdominal tenderness in the left lower quadrant.  Genitourinary:    Rectum: Guaiac result negative. No mass, tenderness or external hemorrhoid. Normal anal tone.     Comments: Chaperone present [name], CMA) Skin:    General: Skin is warm and dry.  Neurological:     General: No focal deficit present.     Mental Status: She is alert and oriented to person, place, and time.  Psychiatric:        Mood and Affect: Mood normal.        Behavior: Behavior normal.        Judgment: Judgment normal.      Results for orders placed or performed in visit on 06/23/22  POCT Occult Blood Stool  Result Value Ref Range   Fecal Occult Blood, POC Negative Negative   Card #1 Date     Card #2 Fecal Occult Blod, POC  Card #2 Date     Card #3 Fecal Occult Blood, POC     Card #3 Date        The 10-year ASCVD risk score (Arnett DK, et al., 2019) is: 0.6%    Assessment & Plan:   Problem List Items Addressed This Visit       Digestive   Chronic idiopathic constipation    Chronic, overall physical exam reassuring.  Will order x-ray for further evaluation of her abdomen.  We will also order BMP and TSH for other causes of possible constipation.  Referral to GI placed today as well.  Pending these results we will consider treatment with daily MiraLAX until she is able to be evaluated with GI.  I also did discuss this patient case with her primary care provider Dr. Mitchel Honour who agrees with recommendations based on the report of her concerns and exam.      Relevant Orders   POCT Occult Blood Stool (Completed)   Other Visit  Diagnoses     Chronic constipation    -  Primary   Relevant Orders   Basic metabolic panel   TSH   DG Abd 2 Views   Ambulatory referral to Gastroenterology       Return if symptoms worsen or fail to improve.  Total time spent today with this patient encounter was greater than 30 minutes.  This included face-to-face evaluation of the patient, reviewing previous medical records, consulting with patient's PCP, and formulating plan of care as well as discussing this plan of care with patient.   Ailene Ards, NP

## 2022-07-05 ENCOUNTER — Ambulatory Visit (INDEPENDENT_AMBULATORY_CARE_PROVIDER_SITE_OTHER): Payer: Managed Care, Other (non HMO) | Admitting: Emergency Medicine

## 2022-07-05 ENCOUNTER — Encounter: Payer: Self-pay | Admitting: Emergency Medicine

## 2022-07-05 VITALS — BP 110/72 | HR 74 | Temp 98.0°F | Ht 60.0 in | Wt 140.1 lb

## 2022-07-05 DIAGNOSIS — H6123 Impacted cerumen, bilateral: Secondary | ICD-10-CM | POA: Diagnosis not present

## 2022-07-05 DIAGNOSIS — K5904 Chronic idiopathic constipation: Secondary | ICD-10-CM

## 2022-07-05 DIAGNOSIS — F341 Dysthymic disorder: Secondary | ICD-10-CM | POA: Diagnosis not present

## 2022-07-05 MED ORDER — TRULANCE 3 MG PO TABS: 3.0000 mg | ORAL_TABLET | Freq: Every day | ORAL | 1 refills | Status: DC

## 2022-07-05 MED ORDER — ESCITALOPRAM OXALATE 10 MG PO TABS: 10.0000 mg | ORAL_TABLET | Freq: Every day | ORAL | 1 refills | Status: DC

## 2022-07-05 NOTE — Assessment & Plan Note (Signed)
Clinically depressed and affecting quality of life. Would like to go back on medication. Recommend to start Lexapro 10 mg daily. Follow-up in 3 months but earlier as needed.

## 2022-07-05 NOTE — Assessment & Plan Note (Signed)
Has tried and failed multiple medications in the past. Recommend high-fiber diet and better hydration. Recommend to start Trulance 3 mg daily.

## 2022-07-05 NOTE — Progress Notes (Signed)
PRE-PROCEDURE EXAM: Bilateral  TM cannot be visualized due to total occlusion/impaction of the ear canal.  PROCEDURE INDICATION: remove wax to visualize ear drum & relieve discomfort  CONSENT:  Verbal     PROCEDURE NOTE:     Bilateral  EAR:  I used warm water irrigation under direct visualization with the otoscope to free the wax bolus from the ear canal.    POST- PROCEDURE EXAM: TMs successfully not visualized.Patient has some discomfort. Provider aware.     The patient tolerated the procedure well.

## 2022-07-05 NOTE — Progress Notes (Signed)
Desiree Krueger 51 y.o.   Chief Complaint  Patient presents with   Follow-up    F/u appt, patient was sick 2 weeks ago left ear feels clogged     HISTORY OF PRESENT ILLNESS: This is a 51 y.o. female recently seen here on 06/23/2022 by Jeralyn Ruths for constipation. Patient has history of chronic idiopathic constipation.  Tried and failed many medications. Improved today.  Blood work showed mild hypokalemia.  No concerns.  Normal thyroid test. Abdominal x-ray unremarkable. Seen by neurologist also on 06/23/2022 for chronic migraine headaches.  Doing well.  Stable. She mentioned depression to neurologist and was recommended to follow-up with me. Also complaining of clogged ears. No other comp points or medical concerns today.  HPI   Prior to Admission medications   Medication Sig Start Date End Date Taking? Authorizing Provider  Calcium Carb-Cholecalciferol (CALCIUM + D3 PO) Take by mouth daily.   Yes [provider]  escitalopram (LEXAPRO) 10 MG tablet Take 1 tablet (10 mg total) by mouth daily. 07/05/22 01/01/23 Yes Maksym Pfiffner, Ines Bloomer, MD  NON FORMULARY [place]   Yes [provider]  ondansetron (ZOFRAN-ODT) 4 MG disintegrating tablet Take 1 tablet (4 mg total) by mouth every 8 (eight) hours as needed for nausea or vomiting. 06/23/22  Yes McCue, Janett Billow, NP  potassium chloride (KLOR-CON M) 10 MEQ tablet Take 1 tablet (10 mEq total) by mouth daily. 06/23/22  Yes Ailene Ards, NP  RIBOFLAVIN PO Take by mouth.   Yes [provider]  rizatriptan (MAXALT-MLT) 10 MG disintegrating tablet Take 1 tablet (10 mg total) by mouth as needed. May repeat in 2 hours if needed 06/23/22  Yes McCue, Janett Billow, NP  tiZANidine (ZANAFLEX) 4 MG tablet Take 1 tablet (4 mg total) by mouth every 6 (six) hours as needed for muscle spasms. 06/23/22  Yes Frann Rider, NP    No Known Allergies  Patient Active Problem List   Diagnosis Date Noted   Chronic idiopathic constipation  06/23/2022   Microscopic hematuria 08/18/2021   Intractable chronic migraine without aura and without status migrainosus 08/18/2021   Postmenopausal 08/18/2021   Conductive hearing loss, bilateral 02/10/2021   Chronic bilateral low back pain without sciatica 02/10/2021   Chronic migraine w/o aura w/o status migrainosus, not intractable 12/24/2020   Headache, new daily persistent (NDPH) 12/24/2020   Iron deficiency 08/24/2017   Elevated liver function tests 03/16/2017   Gastroesophageal reflux disease without esophagitis 01/11/2017   High risk medication use 01/11/2017   History of hyperthyroidism 01/11/2017   Hyperthyroidism 01/11/2017   Anemia due to chronic blood loss 03/16/2016   Submucous leiomyoma of uterus 03/16/2016   DUB (dysfunctional uterine bleeding) 02/22/2016    Past Medical History:  Diagnosis Date   Anemia    Constipation    Depression    Dysrhythmia    GERD (gastroesophageal reflux disease)    Headache    MIGRAINES   Low blood pressure    Lower back pain    Thyroid disease    Varicose vein of leg    BILATERAL    Past Surgical History:  Procedure Laterality Date   DILATATION & CURETTAGE/HYSTEROSCOPY WITH MYOSURE N/A 01/31/2017   Procedure: DILATATION & CURETTAGE/HYSTEROSCOPY WITH MYOSURE;  Surgeon: Terrance Mass, MD;  Location: [facility];  Service: Gynecology;  Laterality: N/A;   DILATATION & CURETTAGE/HYSTEROSCOPY WITH MYOSURE N/A 09/29/2017   Procedure: DILATATION & CURETTAGE/HYSTEROSCOPY WITH MYOSURE RESECTION OF SUBMUCOSAL MYOMA;  Surgeon: Princess Bruins, MD;  Location: [facility];  Service: Gynecology;  Laterality: N/A;  request to follow 2nd case around 10am in [place] block time Requests one hour OR time   HYSTEROSCOPY WITH NOVASURE N/A 09/29/2017   Procedure: [place];  Surgeon: Princess Bruins, MD;  Location: [facility];  Service: Gynecology;  Laterality: N/A;   TUBAL  LIGATION      Social History   Socioeconomic History   Marital status: Single    Spouse name: Not on file   Number of children: 2   Years of education: two years college   Highest education level: Not on file  Occupational History   Occupation: warehouse  Tobacco Use   Smoking status: Former    Years: 6.00    Types: Cigarettes   Smokeless tobacco: Never  Scientific laboratory technician Use: Never used  Substance and Sexual Activity   Alcohol use: Not on file   Drug use: Never   Sexual activity: Yes    Partners: Male    Birth control/protection: Post-menopausal, Surgical    Comment: BTL  Other Topics Concern   Not on file  Social History Narrative   Lives at home with her daughter.   Right-handed.   Caffeine use: 3 cups per day.   Social Determinants of Health   Financial Resource Strain: Not on file  Food Insecurity: Not on file  Transportation Needs: Not on file  Physical Activity: Not on file  Stress: Not on file  Social Connections: Not on file  Intimate Partner Violence: Not on file    Family History  Problem Relation Age of Onset   Varicose Veins Mother    Dementia Mother    Parkinson's disease Father    Cancer Maternal Uncle        COLON     Review of Systems  Constitutional: Negative.  Negative for fever.  HENT:  Positive for hearing loss.   Respiratory: Negative.  Negative for cough and shortness of breath.   Cardiovascular: Negative.  Negative for chest pain and palpitations.  Gastrointestinal:  Positive for constipation.  Genitourinary: Negative.   Psychiatric/Behavioral:  Positive for depression.   All other systems reviewed and are negative.  Today's Vitals   07/05/22 1444  BP: 110/72  Pulse: 74  Temp: 98 F (36.7 C)  TempSrc: Oral  SpO2: 97%  Weight: 140 lb 2 oz (63.6 kg)  Height: 5' (1.524 m)   Body mass index is 27.37 kg/m.   Physical Exam Vitals reviewed.  Constitutional:      Appearance: Normal appearance.  HENT:     Head:  Normocephalic and atraumatic.     Right Ear: There is impacted cerumen.     Left Ear: There is impacted cerumen.     Mouth/Throat:     Mouth: Mucous membranes are moist.     Pharynx: Oropharynx is clear.  Eyes:     Extraocular Movements: Extraocular movements intact.     Conjunctiva/sclera: Conjunctivae normal.     Pupils: Pupils are equal, round, and reactive to light.  Cardiovascular:     Rate and Rhythm: Normal rate and regular rhythm.     Pulses: Normal pulses.     Heart sounds: Normal heart sounds.  Pulmonary:     Effort: Pulmonary effort is normal.     Breath sounds: Normal breath sounds.  Abdominal:     Palpations: Abdomen is soft.     Tenderness: There is no abdominal tenderness.  Musculoskeletal:  Cervical back: No tenderness.  Lymphadenopathy:     Cervical: No cervical adenopathy.  Skin:    General: Skin is warm and dry.  Neurological:     General: No focal deficit present.     Mental Status: She is alert and oriented to person, place, and time.  Psychiatric:        Mood and Affect: Mood normal.        Behavior: Behavior normal.    Ceruminosis is noted.  Disimpaction with irrigation attempted but unsuccessful.  Will refer to ENT.   ASSESSMENT & PLAN: A total of 42 minutes was spent with the patient and counseling/coordination of care regarding preparing for this visit, review of most recent office visit notes, review of most recent neurologist office visit note, review of most recent blood work results, review of most recent abdominal x-ray report, review of multiple chronic medical problems under management, review of all medications and changes made, diagnosis of depression and need to start antidepressant, prognosis, documentation, and need for follow-up.  Problem List Items Addressed This Visit       Digestive   Chronic idiopathic constipation    Has tried and failed multiple medications in the past. Recommend high-fiber diet and better  hydration. Recommend to start Trulance 3 mg daily.      Relevant Medications   Plecanatide (TRULANCE) 3 MG TABS     Nervous and Auditory   Impacted cerumen of both ears   Relevant Orders   Ear Lavage   Ambulatory referral to ENT     Other   Dysthymia - Primary    Clinically depressed and affecting quality of life. Would like to go back on medication. Recommend to start Lexapro 10 mg daily. Follow-up in 3 months but earlier as needed.      Relevant Medications   escitalopram (LEXAPRO) 10 MG tablet   Patient Instructions  Trastorno depresivo mayor en los adultos Major Depressive Disorder, Adult El trastorno depresivo mayor (TDM) es una afeccin de salud mental. [place] trastorno se sienten muy tristes, desesperanzadas y pierden inters en las cosas. [place], casi todos [place], [place]. El TDM puede afectar: [employer]. El trabajo y la escuela. Las cosas que IT sales professional. Cules son las causas? No se conoce la causa del TDM. Qu incrementa el riesgo? Tener familiares con depresin. Ser mujer. Problemas familiares. Consumo indebido de alcohol o drogas. Mucho estrs en la vida, por ejemplo: Vivir sin cubrir las necesidades bsicas, como alimentos y vivienda. Ser tratado mal debido a su raza, sexo o religin (discriminacin). Cosas que le causaron dolor en la niez, especialmente si perdi a un padre o fue vctima de [place]. Problemas de salud fsica y mental que haya tenido durante [place]. Cules son los signos o sntomas? Los sntomas principales de esta afeccin son los siguientes: Estar triste todo [place]. Estar de mal humor (irritable) todo el tiempo. No disfrutar de las cosas que [employer]. Dormir demasiado o muy poco. [place]. Otros sntomas pueden incluir los siguientes: Aumentar o bajar de peso sin saber por qu. Estar inquieto y  dbil. Sentir desesperanza, inutilidad o culpa. Problemas para pensar o tomar decisiones. Pensamientos acerca de lastimarse a usted o a Producer, television/film/video, o pensamientos de acabar con su vida. [place] solo. Incapacidad para realizar las tareas diarias. Si tiene un TDM muy grave, puede: Creer cosas que no son  verdaderas. Or, ver, saborear o sentir cosas que no existen. Tener depresin leve que dura al menos 2 aos. Sentirse muy triste y desesperanzado. Tener dificultad para hablar o moverse. Sentirse muy triste durante algunas estaciones. Cmo se trata? Psicoterapia. Esta terapia le [employer] pensamientos, sentimientos y [place], y a Engineer, civil (consulting). Tambin puede ayudarlo a [employer]. Puede realizarse con miembros de su familia. Medicamentos. Cambios en el estilo de vida. Puede que deba hacer lo siguiente: Limitar el alcohol. Dejar de consumir drogas, si lo hace. Realizar actividad fsica. Dormir lo suficiente. Consumir una dieta saludable. Pasar ms tiempo al [employer]. Estimulacin cerebral. Esto puede realizarse cuando los sntomas son Orlene Erm graves o no han mejorado. Siga estas indicaciones en su casa: Consumo de alcohol No beba alcohol si: Su mdico le indica no hacerlo. Est embarazada, puede estar embarazada o est tratando de [place]. Si bebe alcohol: Limite la cantidad que bebe: De 0 a 1 medida por da para las mujeres. De 0 a 2 medidas por da para los hombres. Sepa cunta cantidad de alcohol hay en las bebidas que toma. En los [place], una medida equivale a una botella de cerveza de 12 oz (355 ml), un vaso de vino de 5 oz (148 ml) o un vaso de una bebida alcohlica de alta graduacin de 1 oz (44 ml). Actividad Haga ejercicios tal como le indic el mdico. Pase tiempo al [place]. Tmese tiempo para hacer las cosas que disfruta. Encuentre formas de lidiar con Dealer. Intente lo siguiente: Meditar. Practicar la  respiracin profunda. Pasar ms tiempo al [employer]. Llevar un diario. Retome sus actividades normales cuando el mdico le diga que es seguro. Indicaciones generales  Use los medicamentos de venta libre y los recetados solamente como se lo haya indicado el mdico. Hable con su mdico acerca de: Su consumo de alcohol. Este [employer] Si consume drogas. Consuma alimentos saludables. Duerma mucho. Considere participar en un grupo de apoyo. Consulte al mdico al respecto. Concurra a [place]. Su mdico tendr que comprobar su estado de nimo, comportamiento y [place], y Quarry manager su tratamiento segn sea necesario. Dnde obtener ms informacin: [employer] on Mental Illness ([place]): nami.[place] ([place] Mental): https://www.frey.org/ American Psychiatric Association (Asociacin Estadounidense de Psiquiatra): www.psychiatry.org Comunquese con un mdico si: Se siente peor. Tiene sntomas nuevos. Solicite ayuda de inmediato si: Se lastima a propsito (se autolesiona). Tiene pensamientos acerca de Sport and exercise psychologist a Producer, television/film/video. Ve, oye, saborea, huele o siente cosas que no existen. Busque ayuda de inmediato si alguna vez siente que puede hacerse dao a usted mismo o a otros, o tiene pensamientos de Doctor, hospital a su vida. Dirjase al centro de urgencias ms cercano o: Llame al 911. Llame a National Suicide Prevention Lifeline ([place] para la Prevencin del Suicidio) al (925)850-9272 o al 988. Est disponible las 24 horas del da. Enve un mensaje de texto a la lnea para casos de crisis al [phone]. Esta informacin no tiene Marine scientist el consejo del mdico. Asegrese de hacerle al mdico cualquier pregunta que tenga. Document Revised: 12/28/2021 Document Reviewed: 12/28/2021 Elsevier Patient Education  [place], MD [facility]

## 2022-07-05 NOTE — Patient Instructions (Signed)
Trastorno depresivo [employer] adultos Major Depressive Disorder, Adult El trastorno depresivo mayor (TDM) es una afeccin de salud mental. [place] trastorno se sienten muy tristes, desesperanzadas y pierden inters en las cosas. [place], casi todos [place], [place] 2 [place]. El TDM puede afectar: [employer]. El trabajo y la escuela. Las cosas que IT sales professional. Cules son las causas? No se conoce la causa del TDM. Qu incrementa el riesgo? Tener familiares con depresin. Ser mujer. Problemas familiares. Consumo indebido de alcohol o drogas. Mucho estrs en la vida, por ejemplo: Vivir sin cubrir las necesidades bsicas, como alimentos y vivienda. Ser tratado mal debido a su raza, sexo o religin (discriminacin). Cosas que le causaron dolor en la niez, especialmente si perdi a un padre o fue vctima de [place]. Problemas de salud fsica y mental que haya tenido durante [place]. Cules son los signos o sntomas? Los sntomas principales de esta afeccin son los siguientes: Estar triste todo [place]. Estar de mal humor (irritable) todo el tiempo. No disfrutar de las cosas que [employer]. Dormir demasiado o muy poco. [place]. Otros sntomas pueden incluir los siguientes: Aumentar o bajar de peso sin saber por qu. Estar inquieto y dbil. Sentir desesperanza, inutilidad o culpa. Problemas para pensar o tomar decisiones. Pensamientos acerca de lastimarse a usted o a Producer, television/film/video, o pensamientos de acabar con su vida. [place] solo. Incapacidad para realizar las tareas diarias. Si tiene un TDM muy grave, puede: Creer cosas que no son verdaderas. Or, ver, saborear o sentir cosas que no existen. Tener depresin leve que dura al menos 2 aos. Sentirse muy triste y desesperanzado. Tener dificultad para hablar o moverse. Sentirse muy triste durante  algunas estaciones. Cmo se trata? Psicoterapia. Esta terapia le [employer] pensamientos, sentimientos y [place], y a Engineer, civil (consulting). Tambin puede ayudarlo a [employer]. Puede realizarse con miembros de su familia. Medicamentos. Cambios en el estilo de vida. Puede que deba hacer lo siguiente: Limitar el alcohol. Dejar de consumir drogas, si lo hace. Realizar actividad fsica. Dormir lo suficiente. Consumir una dieta saludable. Pasar ms tiempo al [employer]. Estimulacin cerebral. Esto puede realizarse cuando los sntomas son Orlene Erm graves o no han mejorado. Siga estas indicaciones en su casa: Consumo de alcohol No beba alcohol si: Su mdico le indica no hacerlo. Est embarazada, puede estar embarazada o est tratando de [place]. Si bebe alcohol: Limite la cantidad que bebe: De 0 a 1 medida por da para las mujeres. De 0 a 2 medidas por da para los hombres. Sepa cunta cantidad de alcohol hay en las bebidas que toma. En los [place], una medida equivale a una botella de cerveza de 12 oz (355 ml), un vaso de vino de 5 oz (148 ml) o un vaso de una bebida alcohlica de alta graduacin de 1 oz (44 ml). Actividad Haga ejercicios tal como le indic el mdico. Pase tiempo al [place]. Tmese tiempo para hacer las cosas que disfruta. Encuentre formas de lidiar con Dealer. Intente lo siguiente: Meditar. Practicar la respiracin profunda. Pasar ms tiempo al [employer]. Llevar un diario. Retome sus actividades normales cuando el mdico le diga que es seguro. Indicaciones generales  Use los medicamentos de venta libre y los recetados solamente como se lo haya indicado el mdico. Hable con su mdico acerca de: Su consumo de alcohol. Este [employer]  medicamentos. Si consume drogas. Consuma alimentos saludables. Duerma mucho. Considere participar en un grupo de apoyo. Consulte al mdico al respecto. Concurra a [place]. Su  mdico tendr que comprobar su estado de nimo, comportamiento y [place], y Quarry manager su tratamiento segn sea necesario. Dnde obtener ms informacin: [employer] on Mental Illness ([place]): nami.[place] ([place] Mental): https://www.frey.org/ American Psychiatric Association (Asociacin Estadounidense de Psiquiatra): www.psychiatry.org Comunquese con un mdico si: Se siente peor. Tiene sntomas nuevos. Solicite ayuda de inmediato si: Se lastima a propsito (se autolesiona). Tiene pensamientos acerca de Sport and exercise psychologist a Producer, television/film/video. Ve, oye, saborea, huele o siente cosas que no existen. Busque ayuda de inmediato si alguna vez siente que puede hacerse dao a usted mismo o a otros, o tiene pensamientos de Doctor, hospital a su vida. Dirjase al centro de urgencias ms cercano o: Llame al 911. Llame a National Suicide Prevention Lifeline ([place] para la Prevencin del Suicidio) al [phone] o al 988. Est disponible las 24 horas del da. Enve un mensaje de texto a la lnea para casos de crisis al [phone]. Esta informacin no tiene Marine scientist el consejo del mdico. Asegrese de hacerle al mdico cualquier pregunta que tenga. Document Revised: 12/28/2021 Document Reviewed: 12/28/2021 Elsevier Patient Education  [place].

## 2022-07-12 ENCOUNTER — Encounter: Payer: Self-pay | Admitting: Adult Health

## 2022-07-12 NOTE — Telephone Encounter (Signed)
Dr Krista Blue, this pt was seen recently 11/9 and declined a daily preventative. She is taking rizatriptan, Zofran and tizanidine as needed for acute management. has had migraine since Friday with no relief. In Jessica's absence, what do you recommend?

## 2022-07-18 ENCOUNTER — Telehealth: Payer: Self-pay | Admitting: Adult Health

## 2022-07-18 NOTE — Telephone Encounter (Signed)
Pt is calling. Stated her insurance need a CPT code before her [place] Block.

## 2022-07-18 NOTE — Telephone Encounter (Signed)
Hey I informed this pt she will need to contact our billing department for codes needed. Please send this to them, we do not handle that information.

## 2022-08-11 ENCOUNTER — Telehealth: Payer: Self-pay | Admitting: Adult Health

## 2022-08-11 NOTE — Telephone Encounter (Signed)
Pt has been informed and will go to HR to have them faxed.

## 2022-08-11 NOTE — Telephone Encounter (Signed)
Pt called wanting to know if her FMLA paperwork can be resubmitted again for this year. She states they expire this month. Please advise.

## 2022-08-24 ENCOUNTER — Telehealth: Payer: Self-pay | Admitting: *Deleted

## 2022-08-24 NOTE — Telephone Encounter (Signed)
Not able to reach pt

## 2022-08-24 NOTE — Telephone Encounter (Signed)
Pt is asking for a call back from Medical Records anytime after 1:30pm today, that is when she gets off work

## 2022-08-31 NOTE — Telephone Encounter (Signed)
Forms completed, placed on NP desk for review and signature.

## 2022-09-05 NOTE — Telephone Encounter (Signed)
Forms placed in MR for pick up

## 2022-09-05 NOTE — Telephone Encounter (Signed)
Pt form faxed on 09/05/22 to [phone]

## 2022-09-05 NOTE — Telephone Encounter (Signed)
Signed and placed in outbox.  Thank you. ?

## 2022-09-12 ENCOUNTER — Telehealth: Payer: Self-pay | Admitting: Emergency Medicine

## 2022-09-12 NOTE — Telephone Encounter (Signed)
Pt is requesting a 3 month refill of her escitalopram (LEXAPRO) 10 MG tablet  LAST OV: 11.21.23  FUTURE OV: N/A  PHONE: [phone]   [id number]   Phone: [phone]  Fax: [phone]

## 2022-09-13 NOTE — Telephone Encounter (Signed)
Called and left message for patient for patient to call her pharmacy to request a refill. Patient should have one more refill at her pharmacy

## 2022-11-08 ENCOUNTER — Ambulatory Visit (INDEPENDENT_AMBULATORY_CARE_PROVIDER_SITE_OTHER): Payer: Managed Care, Other (non HMO) | Admitting: Obstetrics & Gynecology

## 2022-11-08 ENCOUNTER — Encounter: Payer: Self-pay | Admitting: Obstetrics & Gynecology

## 2022-11-08 VITALS — BP 116/62 | HR 83 | Ht 61.25 in | Wt 130.0 lb

## 2022-11-08 DIAGNOSIS — Z9851 Tubal ligation status: Secondary | ICD-10-CM | POA: Diagnosis not present

## 2022-11-08 DIAGNOSIS — Z9889 Other specified postprocedural states: Secondary | ICD-10-CM | POA: Diagnosis not present

## 2022-11-08 DIAGNOSIS — Z01419 Encounter for gynecological examination (general) (routine) without abnormal findings: Secondary | ICD-10-CM

## 2022-11-08 NOTE — Progress Notes (Signed)
Desiree Krueger 1970-11-16 [id number]   History:    52 y.o.  G4P2A2 S/P Bilateral TL.   RP:  Established patient presenting for annual gyn exam    HPI:  Hysteroscopy resection D&C with NovaSure endometrial ablation in 09/2017.  No menses since then.  No pelvic pain.  No pain with IC.  Pap Neg in 10/2021.  No h/o abnormal Pap.  Will repeat at 3 years. Breasts normal.  Mammo Neg 04/2022.  Urine/BMs normal.  BMI 24.36.  Good fitness.  COLONOSCOPY: 05-03-18.  Health labs with Fam MD.    Past medical history,surgical history, family history and social history were all reviewed and documented in the EPIC chart.  Gynecologic History Patient's last menstrual period was 09/18/2017 (exact date).  Obstetric History OB History  Gravida Para Term Preterm AB Living  4 2 2   2 2   SAB IAB Ectopic Multiple Live Births  2       2    # Outcome Date GA Lbr Len/2nd Weight Sex Delivery Anes PTL Lv  4 SAB           3 SAB           2 Term           1 Term              ROS: A ROS was performed and pertinent positives and negatives are included in the history. GENERAL: No fevers or chills. HEENT: No change in vision, no earache, sore throat or sinus congestion. NECK: No pain or stiffness. CARDIOVASCULAR: No chest pain or pressure. No palpitations. PULMONARY: No shortness of breath, cough or wheeze. GASTROINTESTINAL: No abdominal pain, nausea, vomiting or diarrhea, melena or bright red blood per rectum. GENITOURINARY: No urinary frequency, urgency, hesitancy or dysuria. MUSCULOSKELETAL: No joint or muscle pain, no back pain, no recent trauma. DERMATOLOGIC: No rash, no itching, no lesions. ENDOCRINE: No polyuria, polydipsia, no heat or cold intolerance. No recent change in weight. HEMATOLOGICAL: No anemia or easy bruising or bleeding. NEUROLOGIC: No headache, seizures, numbness, tingling or weakness. PSYCHIATRIC: No depression, no loss of interest in normal activity or change in sleep pattern.      Exam:   BP 116/62   Pulse 83   Ht 5' 1.25" (1.556 m)   Wt 130 lb (59 kg)   LMP 09/18/2017 (Exact Date) Comment: BTL  SpO2 98%   BMI 24.36 kg/m   Body mass index is 24.36 kg/m.  General appearance : Well developed well nourished female. No acute distress HEENT: Eyes: no retinal hemorrhage or exudates,  Neck supple, trachea midline, no carotid bruits, no thyroidmegaly Lungs: Clear to auscultation, no rhonchi or wheezes, or rib retractions  Heart: Regular rate and rhythm, no murmurs or gallops Breast:Examined in sitting and supine position were symmetrical in appearance, no palpable masses or tenderness,  no skin retraction, no nipple inversion, no nipple discharge, no skin discoloration, no axillary or supraclavicular lymphadenopathy Abdomen: no palpable masses or tenderness, no rebound or guarding Extremities: no edema or skin discoloration or tenderness  Pelvic: Vulva: Normal             Vagina: No gross lesions or discharge  Cervix: No gross lesions or discharge  Uterus  AV, normal size, shape and consistency, non-tender and mobile  Adnexa  Without masses or tenderness  Anus: Normal   Assessment/Plan:  52 y.o. female for annual exam   1. Well female exam with routine gynecological exam Hysteroscopy resection D&C  with NovaSure endometrial ablation in 09/2017.  No menses since then.  No pelvic pain.  No pain with IC.  Pap Neg in 10/2021.  No h/o abnormal Pap.  Will repeat at 3 years. Breasts normal.  Mammo Neg 04/2022.  Urine/BMs normal.  BMI 24.36.  Good fitness.  COLONOSCOPY: 05-03-18.  Health labs with Fam MD.    2. S/P tubal ligation  3. S/P endometrial ablation  Other orders - MAGNESIUM PO; Take by mouth.   Princess Bruins MD, 3:47 PM

## 2022-12-09 ENCOUNTER — Other Ambulatory Visit: Payer: Self-pay | Admitting: Neurology

## 2022-12-30 ENCOUNTER — Other Ambulatory Visit: Payer: Self-pay | Admitting: Emergency Medicine

## 2022-12-30 DIAGNOSIS — F341 Dysthymic disorder: Secondary | ICD-10-CM

## 2023-03-23 ENCOUNTER — Telehealth: Payer: Self-pay | Admitting: Adult Health

## 2023-03-23 ENCOUNTER — Ambulatory Visit: Payer: Medicaid Other | Admitting: Adult Health

## 2023-03-23 NOTE — Telephone Encounter (Signed)
Sent mychart msg and text msg informing pt of need to reschedule 03/23/23 appt - inclement weather

## 2023-05-02 ENCOUNTER — Other Ambulatory Visit: Payer: Self-pay | Admitting: Emergency Medicine

## 2023-05-02 DIAGNOSIS — Z1231 Encounter for screening mammogram for malignant neoplasm of breast: Secondary | ICD-10-CM

## 2023-05-02 NOTE — Progress Notes (Unsigned)
[facility] [address] [place]. [place] [address] [phone]       OFFICE FOLLOW UP NOTE  [name] Date of Birth:  [birth year] Medical Record Number:  [id number]    Primary neurologist: Dr. Terrace Arabia Reason for visit: Migraine follow-up    SUBJECTIVE:   CHIEF COMPLAINT:  No chief complaint on file.   HPI:   Update 05/03/2023 JM: Patient returns for follow-up visit.  Reports migraine ***.  Use of rizatriptan ***.       History provided for reference purposes only Update 06/23/2022 JM: Patient returns for 39-month migraine follow-up accompanied by interpreter.  Reports approximately 2 migraines over the past month but over the past weekend, more severe and lasted for a few days. She has been dealing with constipation which can worsen her migraine headaches. Will use Aleve with bad migraine, denies over use.  Will use Maxalt occasionally if migraine persists after using Aleve, can cause some drowsiness and concerned that her body will get use to the medication and not be as effective if she uses it too much.  She is currently taking B6 supplement.  Discontinued venlafaxine after prior visit due to side effects.  She does question restarting venlafaxine as her anxiety/depression has been fully worsening.  She also complains of dizziness that can occur randomly, is not triggered by position or quick head movement. Can occur while just sitting, lasts very short duration. Admits to only drinking 1 bottle of water per day.  Did have BMP and TSH completed today by PCP.   Update 12/15/2021 JM: Patient returns for follow-up visit after prior visit 09/13/2021 with Dr. Terrace Arabia.  She is accompanied by interpreter. Migraines have greatly improved since prior visit. Reports about 1 migraine per month over the past couple of months. She continues on effexor 75mg  daily.  She questions decreasing dosage  as she feels she has had increased anxiety and tremulousness since  starting.  She is otherwise tolerating without side effects. Unable to start CRG P injection as previously recommended due to price. Reports starting butterbur and Vit B2 a couple weeks ago with further improvement of her migraines (has mild headache today but no migraine over the past 3 weeks). Will use rizatriptan with benefit and occasionally will need to repeat rizatriptan in addition to tizanidine. Use of Zofran as needed.  She has questions regarding results of MRI that was completed 1 year ago. She does not further look into family history and has found extensive hx of migraines in her family.  no further concerns at this time.   History copied from Dr. Zannie Cove prior OV note for reference purposes only Desiree Krueger is a 52 year old female, seen in request by her primary care physician Dr.   Alvy Bimler, [facility], for evaluation of frequent headaches, initial evaluation was on Dec 24, 2020     I reviewed and summarized the referring note. PMHx.   She reported a long history of chronic migraine headaches, started in her 30s, previous headache was very severe, over the years, she was able to identify triggers, weather change, stress, strong smells, bright light, excessive coffee intake, spicy food, cheese, red wine   She has made major changes in her diet, with some improvement of her headache, but she still has 4 headaches days in a week, she described lateralized, sometimes holoacranial pressure headaches, evolving to severe pounding headache with light noise smell sensitivity, lasting for hours   For a while, she was using frequent Excedrin  Migraine almost daily basis, in February 2022, she was giving prescription of Imitrex 50 mg as needed, she used up her monthly supply of 9 tablets, sometimes has to buy extra, and laid back on her over-the-counter medication   She never tried preventive medication in the past, her brother also suffered migraine   She denies visual loss, father  passed away with Parkinson's disease, mother suffered dementia   With her frequent headaches, also complains of emotional outburst, her mood switch quickly'   UPDATE Sep 13 2021: She is no longer taking Nortriptyline, up to 20mg  qhs, did not help her headache much, cause constipation, Maxalt prn was helpful, up to 2 tabs/one day up to 4 days a week, her headache usually improve after 1 hour, she is also taking magnesium and riboflavin    She continues to complains of mood change, depression, anxiety, in tear today. She has to stay in dark all the time.   Personally reviewed MRI of the brain without contrast in May 2022: Mild small vessel disease, no acute abnormality       ROS:   14 system review of systems performed and negative with exception of this in HPI  PMH:  Past Medical History:  Diagnosis Date   Anemia    Constipation    Depression    Dysrhythmia    GERD (gastroesophageal reflux disease)    Headache    MIGRAINES   Low blood pressure    Lower back pain    Thyroid disease    Varicose vein of leg    BILATERAL    PSH:  Past Surgical History:  Procedure Laterality Date   DILATATION & CURETTAGE/HYSTEROSCOPY WITH MYOSURE N/A 01/31/2017   Procedure: DILATATION & CURETTAGE/HYSTEROSCOPY WITH MYOSURE;  Surgeon: Ok Edwards, MD;  Location: [facility];  Service: Gynecology;  Laterality: N/A;   DILATATION & CURETTAGE/HYSTEROSCOPY WITH MYOSURE N/A 09/29/2017   Procedure: DILATATION & CURETTAGE/HYSTEROSCOPY WITH MYOSURE RESECTION OF SUBMUCOSAL MYOMA;  Surgeon: Genia Del, MD;  Location: [facility];  Service: Gynecology;  Laterality: N/A;  request to follow 2nd case around 10am in [place] [facility]yn block time Requests one hour OR time   HYSTEROSCOPY WITH NOVASURE N/A 09/29/2017   Procedure: HYSTEROSCOPY WITH NOVASURE ENDOMETRIAL ABLATION;  Surgeon: Genia Del, MD;  Location: [facility] [place];  Service: Gynecology;  Laterality: N/A;   TUBAL  LIGATION      Social History:  Social History   Socioeconomic History   Marital status: Single    Spouse name: Not on file   Number of children: 2   Years of education: two years college   Highest education level: Not on file  Occupational History   Occupation: warehouse  Tobacco Use   Smoking status: Former    Types: Cigarettes   Smokeless tobacco: Never  Vaping Use   Vaping status: Never Used  Substance and Sexual Activity   Alcohol use: Not Currently   Drug use: Never   Sexual activity: Yes    Partners: Male    Birth control/protection: Post-menopausal, Surgical    Comment: BTL  Other Topics Concern   Not on file  Social History Narrative   Lives at home with her daughter.   Right-handed.   Caffeine use: 3 cups per day.   Social Determinants of Health   Financial Resource Strain: Not on file  Food Insecurity: Not on file  Transportation Needs: Not on file  Physical Activity: Not on file  Stress: Not on file  Social  Connections: Not on file  Intimate Partner Violence: Not on file    Family History:  Family History  Problem Relation Age of Onset   Varicose Veins Mother    Dementia Mother    Parkinson's disease Father    Cancer Maternal Uncle        COLON    Medications:   Current Outpatient Medications on File Prior to Visit  Medication Sig Dispense Refill   Calcium Carb-Cholecalciferol (CALCIUM + D3 PO) Take by mouth daily.     escitalopram (LEXAPRO) 10 MG tablet TAKE 1 TABLET(10 MG) BY MOUTH DAILY 90 tablet 1   MAGNESIUM PO Take by mouth.     ondansetron (ZOFRAN-ODT) 4 MG disintegrating tablet Take 1 tablet (4 mg total) by mouth every 8 (eight) hours as needed for nausea or vomiting. 20 tablet 11   RIBOFLAVIN PO Take by mouth.     rizatriptan (MAXALT-MLT) 10 MG disintegrating tablet DISSOLVE ONE TABLET BY MOUTH AS NEEDED; MAY REPEAT IN 2 HOURS IF NEEDED 12 tablet 11   tiZANidine (ZANAFLEX) 4 MG tablet Take 1 tablet (4 mg total) by mouth every 6 (six)  hours as needed for muscle spasms. 30 tablet 11   No current facility-administered medications on file prior to visit.    Allergies:  No Known Allergies    OBJECTIVE:  Physical Exam  There were no vitals filed for this visit.   There is no height or weight on file to calculate BMI. No results found.  General: well developed, well nourished, very pleasant middle-age female, seated, in no evident distress Head: head normocephalic and atraumatic.   Neck: supple with no carotid or supraclavicular bruits Cardiovascular: regular rate and rhythm, no murmurs Musculoskeletal: no deformity Skin:  no rash/petichiae Vascular:  Normal pulses all extremities   Neurologic Exam Mental Status: Awake and fully alert.  Limited English but unable to appreciate aphasia or dysarthria.  Oriented to place and time. Recent and remote memory intact. Attention span, concentration and fund of knowledge appropriate. Mood and affect appropriate.  Cranial Nerves:  Pupils equal, briskly reactive to light. Extraocular movements full without nystagmus. Visual fields full to confrontation. Hearing intact. Facial sensation intact. Face, tongue, palate moves normally and symmetrically.  Motor: Normal bulk and tone. Normal strength in all tested extremity muscles Sensory.: intact to touch , pinprick , position and vibratory sensation.  Coordination: Rapid alternating movements normal in all extremities. Finger-to-nose and heel-to-shin performed accurately bilaterally. Gait and Station: Arises from chair without difficulty. Stance is normal. Gait demonstrates normal stride length and balance without use of AD. Tandem walk and heel toe without difficulty.  Reflexes: 1+ and symmetric. Toes downgoing.         ASSESSMENT: Desiree Krueger is a 52 y.o. year old female chronic migraine headaches which have significantly improved since starting venlafaxine and additional benefit after vit b2 and butterbur  supplement.      PLAN:  Chronic migraine headaches:  -Preventative: No indication for daily preventative medication at this time as only have 1-2 migraine days per month, advised to call if headaches should worsen  -Acute: Continue rizatriptan, Zofran and tizanidine as needed for acute management.  Discussed use of other triptan as current triptan can cause drowsiness but declines interest at this time.  Encouraged her to use at migraine onset for greatest benefit.  Tried/failed: Nortriptyline, venlafaxine, rizatriptan, Zofran, tizanidine, sumatriptan.   Unable to afford CRGP Would not recommend beta-blockers due to already low blood pressure   2.  Dizziness, nonspecific -Neurological exam intact, no associated neurological symptoms -Encouraged increasing water intake to at least 64 to 100 ounces of water per day -Ensure adequate calorie intake -If symptoms persist, recommend follow-up with PCP for further evaluation   3.  Depression/anxiety -Would not recommend restarting venlafaxine as patient questioned due to this medication previously worsening anxiety -advised to f/u with PCP for further discussion/treatment options      Follow up in 9 months or call earlier if needed   CC:  PCP: Georgina Quint, MD    I spent 38 minutes of face-to-face and non-face-to-face time with patient assisted by interpreter.  This included previsit chart review, lab review, study review, order entry, electronic health record documentation, patient education and discussion regarding above diagnoses and treatment plan and answered all the questions to patient satisfaction  Ihor Austin, [facility]  [facility] [address] [place], [place] [address]  Phone [phone] Fax [phone] Note: This document was prepared with digital dictation and possible smart phrase technology. Any transcriptional errors that result from this process are  unintentional.

## 2023-05-03 ENCOUNTER — Encounter: Payer: Self-pay | Admitting: Adult Health

## 2023-05-03 ENCOUNTER — Ambulatory Visit (INDEPENDENT_AMBULATORY_CARE_PROVIDER_SITE_OTHER): Payer: Managed Care, Other (non HMO) | Admitting: Adult Health

## 2023-05-03 VITALS — BP 99/65 | HR 82 | Ht 62.0 in | Wt 134.0 lb

## 2023-05-03 DIAGNOSIS — G43709 Chronic migraine without aura, not intractable, without status migrainosus: Secondary | ICD-10-CM | POA: Diagnosis not present

## 2023-05-03 DIAGNOSIS — F419 Anxiety disorder, unspecified: Secondary | ICD-10-CM

## 2023-05-03 DIAGNOSIS — F32A Depression, unspecified: Secondary | ICD-10-CM

## 2023-05-03 MED ORDER — DULOXETINE HCL 30 MG PO CPEP: 30.0000 mg | ORAL_CAPSULE | Freq: Every day | ORAL | 5 refills | Status: DC

## 2023-05-03 NOTE — Patient Instructions (Addendum)
Your Plan:   Recommend starting duloxetine 30mg  daily for migraine prevention - please call after 3-4 weeks if no benefit for need of dosage increase   Continue rizatriptan as needed for migraine rescue  It will be very important to manage stressors as these can greatly trigger migraine headaches     Follow up in 6 months or call earlier if needed      Thank you for coming to see [place] at [facility]. I hope we have been able to provide you high quality care today.  You may receive a patient satisfaction survey over the next few weeks. We would appreciate your feedback and comments so that we may continue to improve ourselves and the health of our patients.    Duloxetine Delayed-Release Capsules Qu es este medicamento? La DULOXETINA se [place] para tratar la depresin, la ansiedad, la fibromialgia y ciertos tipos de Engineer, mining crnico tales como dolor neurolgico, seo o articular. Aumenta la cantidad de serotonina y norepinefrina en el cerebro, hormonas que ayudan a regular el estado de nimo y Chief Technology Officer. Pertenece a un grupo de Systems analyst. Este medicamento puede ser utilizado para otros usos; si tiene alguna pregunta consulte con su proveedor de atencin mdica o con su farmacutico. MARCAS COMUNES: Cymbalta, Drizalma, Deirdre Priest le debo informar a mi profesional de la salud antes de tomar este medicamento? Necesitan saber si usted presenta alguno de los siguientes problemas o situaciones: Trastorno bipolar Glaucoma Presin arterial alta Enfermedad renal Enfermedad heptica Convulsiones Ideas, planes o intento de suicidio por parte suya o de alguien de su familia Si [place] medicamentos que tratan o previenen cogulos sanguneos Si ha tomado un IMAO, tales como [place], [place], [place], [place] o Parnate en los ltimos 14 das Dificultad para orinar Una reaccin inusual a la duloxetina, a otros medicamentos, alimentos, colorantes o conservantes Si est  en embarazo o buscando quedar en embarazo Si est amamantando a un beb Cmo debo Visual merchandiser medicamento? Tome este medicamento por va oral con agua. Use el medicamento segn las instrucciones en la etiqueta a la misma hora todos [place]. No corte, triture ni [employer]. Trague las cpsulas enteras. Algunas cpsulas pueden abrirse y [address] puede espolvorearse sobre pur de [place]. Pida asesoramiento a su equipo de atencin o farmacutico si no est seguro. Puede tomar este medicamento con o sin alimentos. No use su medicamento con una frecuencia mayor a la indicada. No deje de usar [employer] de repente a menos que as lo indique su equipo de atencin. Dejar de Chemical engineer este medicamento demasiado rpido puede causar efectos secundarios graves o podra empeorar su afeccin. Su farmacutico le dar una Gu[place] del medicamento especial (MedGuide, nombre en ingls) con cada receta y en cada ocasin que la vuelva a surtir. Asegrese de leer esta informacin cada vez cuidadosamente. Hable con su equipo de atencin sobre el uso de este medicamento en nios. Aunque se puede recetar a nios tan pequeos como de 7 aos de edad con ciertas afecciones, existen precauciones que deben tomarse. Sobredosis: Pngase en contacto inmediatamente con un centro toxicolgico o una sala de urgencia si usted cree que haya tomado demasiado medicamento.<br>ATENCIN: [employer] es solo para usted. No comparta este medicamento con nadie. Qu sucede si me olvido de una dosis? Si olvida una dosis, tmela lo antes posible. Si es casi la hora de la prxima dosis, tome slo esa dosis. No tome dosis adicionales o dobles. Qu puede interactuar con este medicamento? No use este  medicamento con ninguno de los siguientes productos: Desvenlafaxina Levomilnaciprn Linezolida IMAO, tales como [place], [place], [place], [place], [place] y Parnate Azul de metileno (inyectado en una  vena) Milnaciprn Safinamida Tioridazina Venlafaxina Viloxazina Este medicamento tambin podra interactuar con los siguientes productos: Alcohol Anfetaminas Aspirina y otros medicamentos tipo aspirina Ciertos antibiticos, tales como ciprofloxacino y enoxacino Ciertos medicamentos para la presin arterial, enfermedad cardiaca y frecuencia cardiaca irregular Ciertos medicamentos para afecciones de salud mental Ciertos medicamentos para la migraa, tales como almotriptn, eletriptn, frovatriptn, naratriptn, rizatriptn, sumatriptn, zolmitriptn Ciertos medicamentos que tratan o previenen cogulos sanguneos, tales como warfarina, enoxaparina y dalteparina Cimetidina Fentanilo [place], medicamentos para Chief Technology Officer y la inflamacin, tales como ibuprofeno o naproxeno Fentermina Procarbazina Rasagilina Sibutramina Hierba de [place] Juan Teofilina Tramadol Triptfano Puede ser que esta lista no menciona todas las posibles interacciones. Informe a su profesional de [employer] de [employer] productos a base de hierbas, medicamentos de [place] o suplementos nutritivos que est tomando. Si usted fuma, consume bebidas alcohlicas o si utiliza drogas ilegales, indqueselo tambin a su profesional de [employer]. Algunas sustancias pueden interactuar con su medicamento. A qu debo estar atento al usar [employer] Corporation? Si los sntomas no mejoran o si empeoran, informe a su equipo de atencin. Visite a su equipo de atencin para que revise su evolucin peridicamente. Debido a que podran necesitarse varias semanas para ver los efectos completos de [place], es importante que contine con el tratamiento segn las indicaciones de su equipo de atencin. Este medicamento podra causar reacciones graves en la piel. Pueden presentarse semanas a meses despus de comenzar a Astronomer. Contacte a su equipo de atencin de inmediato si nota que tiene fiebre o sntomas gripales con una  erupcin. La erupcin puede ser roja o Clarisa Fling, y luego puede convertirse en ampollas o descamacin de la piel. Tambin podra observar una erupcin roja con hinchazn en la cara, los labios o los ganglios linfticos en el cuello o debajo de los brazos. Preste atencin para Landscape architect aparicin o el empeoramiento de la depresin o las ideas suicidas. Esto incluye cambios repentinos en el estado de nimo, comportamientos o pensamientos. Estos cambios pueden suceder en cualquier momento, pero son ms frecuentes durante el inicio del tratamiento o despus de un cambio en la dosis. Llame a su equipo de atencin de inmediato si tiene estos pensamientos o empeora su depresin. Este medicamento podra causar Mattel de nimo y el comportamiento, tales como ansiedad, [place], irritabilidad, hostilidad, inquietud, excitabilidad, hiperactividad o problemas para dormir. Estos cambios pueden suceder en cualquier momento, pero son ms frecuentes durante el inicio del tratamiento o despus de un cambio en la dosis. Llame a su equipo de atencin de inmediato si nota alguno de estos sntomas. Este medicamento podra afectar su coordinacin, tiempo de reaccin o juicio. No conduzca ni opere maquinaria pesada hasta que sepa cmo le afecta este medicamento. Pngase de pie o levntese lentamente para reducir el riesgo de mareos o [place]. Beber alcohol con [employer] Corporation puede aumentar el riesgo de estos efectos secundarios. Este medicamento puede aumentar los niveles de Banker. El riesgo podra ser mayor en pacientes que ya tienen diabetes. Pregunte a su equipo de atencin qu puede hacer para disminuir su riesgo de diabetes mientras [place] este medicamento. Este medicamento puede causar un aumento de la presin sangunea. Este medicamento tambin puede causar una disminucin repentina de la presin sangunea, lo cual puede hacer que tenga la sensacin de que va  a desmayarse y Administrator, Civil Service la  probabilidad de Neomia Dear cada. Estos efectos son ms comunes cuando comienza a Astronomer o cuando se aumenta la dosis, o durante el uso de otros medicamentos que pueden causar una disminucin repentina de la presin sangunea. Hable con su equipo de atencin para obtener instrucciones sobre la monitorizacin de la presin sangunea mientras [place] este medicamento. Se le podra secar la boca. Masticar chicle sin azcar o chupar caramelos duros y beber agua en abundancia podra ser de [place]. Si el problema no desaparece o es grave, comunquese con su equipo de atencin. Qu efectos secundarios puedo tener al [employer] este medicamento? Efectos secundarios que debe informar a su equipo de atencin tan pronto como sea posible: Reacciones alrgicas: erupcin cutnea, comezn/picazn, urticaria, hinchazn de la cara, los labios, la lengua o la garganta Sangrado: heces con [place], o de color negro y aspecto alquitranado, orina de color rojo o marrn oscuro, vomitar sangre o material marrn que tiene el aspecto de posos (residuos) de caf, pequeas manchas rojas o moradas en la piel, sangrado o moretones inusuales Aumento de la presin arterial Lesin en el hgado: dolor en la regin abdominal superior derecha, prdida de apetito, nuseas, heces de color claro, orina amarilla oscura o marrn, color amarillento de los ojos o la piel, debilidad o fatiga inusuales Nivel bajo de sodio: debilidad muscular, fatiga, mareos, dolor de cabeza, confusin Enrojecimiento, formacin de ampollas, descamacin o distensin de la piel, incluso dentro de la boca Sndrome serotoninrgico (de la serotonina): irritabilidad, confusin, frecuencia cardiaca rpida o irregular, rigidez muscular, tics musculares, sudoracin, fiebre alta, convulsiones, escalofros, vmito, diarrea Dolor repentino en los ojos o cambio en la visin como visin borrosa, ver halos alrededor Assurant, prdida de visin Ideas suicidas o de  autolesionarse, empeoramiento del [place] de nimo, sensacin de depresin Dificultad para orinar Efectos secundarios que generalmente no requieren atencin mdica (debe informarlos a su equipo de atencin si persisten o si son molestos): Cambios en el deseo o desempeo sexual Estreimiento Diarrea IT trainer seca Sudoracin excesiva Prdida del apetito Nuseas Vmito Puede ser que esta lista no menciona todos los posibles efectos secundarios. Comunquese a su mdico por asesoramiento mdico [employer]ard. Usted puede informar los efectos secundarios a la FDA por telfono al 1-800-FDA-1088. Dnde debo guardar mi medicina? Mantenga fuera del alcance de nios y Neurosurgeon. Guarde a [employer]I, entre 15 y 30 grados Celsius (59 y 52 grados Fahrenheit). Deseche todo el medicamento que no haya utilizado despus de la fecha de vencimiento. Para desechar los medicamentos que ya no necesite o que estn vencidos: Lleve el medicamento a un programa de recuperacin de medicamentos. Consulte con su farmacia o con una entidad reguladora para encontrar un lugar donde llevarlo. Si no puede devolver el medicamento, consulte la etiqueta o el folleto de informacin para ver si debe desecharlo en la basura o arrojarlo por el sanitario. Si no est seguro, consulte con su equipo de atencin. Si es seguro colocarlo en la basura, saque el medicamento del recipiente. Mezcle el medicamento con piedras sanitarias para gatos, tierra, posos (residuos) de caf u otro desperdicio. Coloque la [employer] bolsa o recipiente que quede [place]. Deseche en la basura. <b>ATENCIN: Este folleto es un resumen. Puede ser que no cubra toda la posible informacin. Si usted tiene preguntas acerca de esta medicina, consulte con su mdico, su farmacutico o su profesional de Radiographer, therapeutic.</b>  2024 Elsevier/Gold Standard (2022-11-10 00:00:00)      Cefalea migraosa  Migraine Headache Una cefalea migraosa  es un dolor intenso pulsante o punzante en uno o ambos lados de la cabeza. Las cefaleas migraosas tambin pueden causar otros sntomas, como nuseas, vmitos y sensibilidad a la luz y el ruido. Una cefalea migraosa puede durar desde 4 horas [employer]. Hable con su mdico United Stationers factores que pueden causar Animal nutritionist) las Soil scientist. Cules son las causas? La causa exacta se desconoce. Sin embargo, una migraa puede aparecer [employer] nervios del cerebro se irritan y liberan sustancias qumicas que hacen que los vasos sanguneos se inflamen. Esta inflamacin provoca dolor. Las causas o los desencadenantes de las migraas pueden ser los siguientes: Fumar. Medicamentos, por ejemplo: Nitroglicerina, que se [place] para tratar el dolor torcico. Pldoras anticonceptivas. Estrgeno. Ciertos medicamentos para la presin arterial. Los alimentos o las bebidas que contienen nitratos, glutamato, aspartamo, glutamato monosdico (GMS) o tiramina. Ciertos alimentos o bebidas, como quesos aejos, chocolate, alcohol o cafena. Hacer actividad fsica muy vigorosa. Otros factores desencadenantes pueden incluir los siguientes: Menstruacin. Embarazo. Hambre. Estrs. Dormir demasiado o muy poco. Cambios climticos. Cansancio (fatiga). Qu incrementa el riesgo? Los siguientes factores pueden hacer que usted sea ms propenso a tener cefaleas migraosas: Irven Easterly 25 y 22 aos. Ser mujer. Tener antecedentes familiares de cefalea migraosa. Ser de Engineer, manufacturing. Tener una afeccin de salud mental, como depresin o ansiedad. Ser obeso. Cules son los signos o sntomas? El principal sntoma de esta afeccin es el dolor pulsante o punzante. Este dolor puede [employer] siguientes caractersticas: Puede aparecer en cualquier regin de la cabeza, tanto de un lado como de [place]. Puede dificultar las actividades cotidianas. Puede empeorar con la actividad fsica. Puede empeorar con las luces  brillantes, los ruidos fuertes o los olores. Otros sntomas pueden incluir: Nuseas. Vmitos. Mareos. Antes de que comience una cefalea migraosa, usted puede recibir seales de advertencia (un aura). Un aura puede incluir: Ver luces intermitentes o tener puntos ciegos. Ver puntos brillantes, halos o lneas en zigzag. Tener una visin en tnel o visin borrosa. Sentir entumecimiento u hormigueo. Tener dificultad para hablar. Debilidad muscular. Despus de que la migraa finaliza, puede tener sntomas. Pueden incluir: Cansancio. Dificultad para concentrarse. Cmo se diagnostica? La cefalea migraosa se diagnostica en funcin de lo siguiente: Sus sntomas. Un examen fsico. Estudios, como, por ejemplo: Exploracin por tomografa computarizada (TC) o resonancia magntica (RM) de la cabeza. Estos estudios pueden ayudar a Teacher, early years/pre causas de cefalea. Una muestra de lquido de la columna vertebral (puncin lumbar) para analizarla (anlisis de lquido cefalorraqudeo o anlisis de LCR). Cmo se trata? Esta afeccin se puede tratar con medicamentos para: Engineer, materials y las nuseas. Prevenir las migraas. El tratamiento tambin puede incluir lo siguiente: Acupuntura. Cambios en el estilo de vida, como evitar los alimentos que provocan las cefaleas migraosas. Aprender Duaine Dredge de controlar el cuerpo (biorretroalimentacin). Psicoterapia para ayudarlo a Solicitor y lidiar con los pensamientos negativos (terapia cognitivo conductual). Siga estas instrucciones en su casa: Medicamentos Use los medicamentos de venta libre y los recetados solamente como se lo haya indicado el mdico. Pregntele al mdico si el medicamento recetado: Requiere que evite conducir o usar [place]. Puede causarle estreimiento. Es posible que tenga que tomar estas medidas para prevenir o tratar el estreimiento: Product manager suficiente lquido para Radio producer pis (orina) de color amarillo plido. Usar  medicamentos recetados o de Sales promotion account executive. Consumir alimentos ricos en fibra, como frijoles, cereales integrales, y frutas y verduras frescas. Limitar el consumo de alimentos ricos en grasa y  azcares procesados, como los alimentos fritos o dulces. Estilo de vida  No beba alcohol. No consuma ningn producto que contenga nicotina o tabaco. Estos productos incluyen cigarrillos, tabaco para Theatre manager y aparatos de vapeo, como los Administrator, Civil Service. Si necesita ayuda para dejar de fumar, consulte al mdico. Duerma entre 7 y 9 horas todas las noches o la cantidad de horas que le haya recomendado el mdico. Encuentre modos de [place] estrs, por ejemplo, a travs de la meditacin, la respiracin profunda o el yoga. Trate de realizar ejercicio a diario. Esto puede ayudar a [employer] gravedad y la frecuencia de las migraas. Instrucciones generales Mantenga un registro para Engineer, mining las migraas, con el fin de poder evitar esas cosas. Registre, por ejemplo, lo siguiente: Lo que usted come y bebe. El tiempo que duerme. Algn cambio en su dieta o en los medicamentos. Si tiene una cefalea migraosa: Evite los factores que [employer] sntomas, como las luces brillantes. Recustese en una habitacin oscura y tranquila. No conduzca ni opere maquinaria. Pregntele al mdico qu actividades son seguras para usted mientras tiene sntomas. Concurra a todas las visitas de seguimiento. El mdico controlar sus sntomas y Arts administrator tratamiento adicional si lo necesita. Dnde obtener ms informacin Coalition for Headache and Migraine Patients (CHAMP) (Coalicin para Pacientes con Cefaleas y Migraas): headachemigraine.org American Migraine Foundation (Fundacin Estadounidense para la Migraa): americanmigrainefoundation.org National Headache Foundation (Fundacin Nacional para la Cefalea): headaches.org Comunquese con un mdico si: Tiene sntomas de cefalea migraosa que son  distintos o peores que los habituales. Tiene ms de [address] de cefalea por mes. Solicite ayuda de inmediato si: La cefalea migraosa se pone muy intensa o dura ms de 72 horas. Tiene fiebre o rigidez en el cuello. Presenta prdida de la visin. Siente debilidad en los msculos o que no puede controlarlos. Pierde el equilibrio con frecuencia o tiene problemas para caminar. Se desmaya. Tiene una convulsin. Esta informacin no tiene Theme park manager el consejo del mdico. Asegrese de hacerle al mdico cualquier pregunta que tenga. Document Revised: 05/03/2022 Document Reviewed: 05/03/2022 Elsevier Patient Education  2024 [employer].

## 2023-05-11 ENCOUNTER — Ambulatory Visit
Admission: RE | Admit: 2023-05-11 | Discharge: 2023-05-11 | Disposition: A | Discharge status: Home / Self Care | Payer: Managed Care, Other (non HMO) | Source: Ambulatory Visit

## 2023-05-11 DIAGNOSIS — Z1231 Encounter for screening mammogram for malignant neoplasm of breast: Secondary | ICD-10-CM

## 2023-05-15 ENCOUNTER — Ambulatory Visit (INDEPENDENT_AMBULATORY_CARE_PROVIDER_SITE_OTHER): Payer: Managed Care, Other (non HMO) | Admitting: Emergency Medicine

## 2023-05-15 ENCOUNTER — Encounter: Payer: Self-pay | Admitting: Adult Health

## 2023-05-15 VITALS — BP 104/64 | HR 80 | Temp 98.3°F | Ht 62.0 in | Wt 135.2 lb

## 2023-05-15 DIAGNOSIS — F341 Dysthymic disorder: Secondary | ICD-10-CM | POA: Diagnosis not present

## 2023-05-15 DIAGNOSIS — G43719 Chronic migraine without aura, intractable, without status migrainosus: Secondary | ICD-10-CM

## 2023-05-15 MED ORDER — ESCITALOPRAM OXALATE 10 MG PO TABS
10.0000 mg | ORAL_TABLET | Freq: Every day | ORAL | 3 refills | Status: DC
Start: 2023-05-15 — End: 2023-07-12

## 2023-05-15 NOTE — Patient Instructions (Signed)

## 2023-05-15 NOTE — Assessment & Plan Note (Addendum)
Mixed depression and anxiety Known triggers.  Mostly work related stress. Work note limiting working hours provided. Advised to not work more than 8-hour shifts and no more than 40 hours/week. Still active but much improved on Lexapro 10 mg We will continue medication at the same dose Recently tried on duloxetine but failed due to side effects. Stress management discussed.

## 2023-05-15 NOTE — Assessment & Plan Note (Addendum)
Follows up with neurologist on a regular basis Known triggers Medication helping Did not tolerate duloxetine prescribed by neurologist

## 2023-05-15 NOTE — Progress Notes (Signed)
Desiree Krueger 52 y.o.   Chief Complaint  Patient presents with   Headache    Patient is having migraine, more often than normal     HISTORY OF PRESENT ILLNESS: This is a 52 y.o. female here for follow-up of depression/anxiety and chronic migraine headaches Was able to recently follow-up with neurologist. Recently prescribed duloxetine but cannot tolerate side effects i.e. sleepiness Still struggling with work stresses.  Headache  Pertinent negatives include no abdominal pain, coughing, fever, nausea, sore throat or vomiting.     Prior to Admission medications   Medication Sig Start Date End Date Taking? Authorizing Provider  escitalopram (LEXAPRO) 10 MG tablet TAKE 1 TABLET(10 MG) BY MOUTH DAILY 12/30/22  Yes Curtistine Pettitt, Eilleen Kempf, MD  ondansetron (ZOFRAN-ODT) 4 MG disintegrating tablet Take 1 tablet (4 mg total) by mouth every 8 (eight) hours as needed for nausea or vomiting. 06/23/22  Yes McCue, Shanda Bumps, NP  rizatriptan (MAXALT-MLT) 10 MG disintegrating tablet DISSOLVE ONE TABLET BY MOUTH AS NEEDED; MAY REPEAT IN 2 HOURS IF NEEDED 12/12/22  Yes McCue, Shanda Bumps, NP  tiZANidine (ZANAFLEX) 4 MG tablet Take 1 tablet (4 mg total) by mouth every 6 (six) hours as needed for muscle spasms. 06/23/22  Yes Ihor Austin, NP    No Known Allergies  Patient Active Problem List   Diagnosis Date Noted   Dysthymia 07/05/2022   Impacted cerumen of both ears 07/05/2022   Chronic idiopathic constipation 06/23/2022   Intractable chronic migraine without aura and without status migrainosus 08/18/2021   Postmenopausal 08/18/2021   Conductive hearing loss, bilateral 02/10/2021   Chronic bilateral low back pain without sciatica 02/10/2021   Chronic migraine w/o aura w/o status migrainosus, not intractable 12/24/2020   Headache, new daily persistent ([facility]) 12/24/2020   Iron deficiency 08/24/2017   Elevated liver function tests 03/16/2017   Gastroesophageal reflux disease without esophagitis  01/11/2017   High risk medication use 01/11/2017   History of hyperthyroidism 01/11/2017   Hyperthyroidism 01/11/2017   Anemia due to chronic blood loss 03/16/2016   Submucous leiomyoma of uterus 03/16/2016    Past Medical History:  Diagnosis Date   Anemia    Constipation    Depression    Dysrhythmia    GERD (gastroesophageal reflux disease)    Headache    MIGRAINES   Low blood pressure    Lower back pain    Thyroid disease    Varicose vein of leg    BILATERAL    Past Surgical History:  Procedure Laterality Date   DILATATION & CURETTAGE/HYSTEROSCOPY WITH MYOSURE N/A 01/31/2017   Procedure: DILATATION & CURETTAGE/HYSTEROSCOPY WITH MYOSURE;  Surgeon: Ok Edwards, MD;  Location: [facility];  Service: Gynecology;  Laterality: N/A;   DILATATION & CURETTAGE/HYSTEROSCOPY WITH MYOSURE N/A 09/29/2017   Procedure: DILATATION & CURETTAGE/HYSTEROSCOPY WITH MYOSURE RESECTION OF SUBMUCOSAL MYOMA;  Surgeon: Genia Del, MD;  Location: [facility];  Service: Gynecology;  Laterality: N/A;  request to follow 2nd case around 10am in [place] Gyn block time Requests one hour OR time   HYSTEROSCOPY WITH NOVASURE N/A 09/29/2017   Procedure: HYSTEROSCOPY WITH NOVASURE ENDOMETRIAL ABLATION;  Surgeon: Genia Del, MD;  Location: [facility];  Service: Gynecology;  Laterality: N/A;   TUBAL LIGATION      Social History   Socioeconomic History   Marital status: Single    Spouse name: Not on file   Number of children: 2   Years of education: two years college   Highest education level: GED  or equivalent  Occupational History   Occupation: warehouse  Tobacco Use   Smoking status: Former    Types: Cigarettes   Smokeless tobacco: Never  Vaping Use   Vaping status: Never Used  Substance and Sexual Activity   Alcohol use: Not Currently   Drug use: Never   Sexual activity: Yes    Partners: Male    Birth control/protection: Post-menopausal, Surgical     Comment: BTL  Other Topics Concern   Not on file  Social History Narrative   Lives at home with her daughter.   Right-handed.   Caffeine use: 3 cups per day.   Social Determinants of Health   Financial Resource Strain: Patient Declined (05/15/2023)   Overall Financial Resource Strain (CARDIA)    Difficulty of Paying Living Expenses: Patient declined  Food Insecurity: Patient Declined (05/15/2023)   Hunger Vital Sign    Worried About Running Out of Food in the Last Year: Patient declined    Ran Out of Food in the Last Year: Patient declined  Transportation Needs: Patient Declined (05/15/2023)   PRAPARE - Administrator, Civil Service (Medical): Patient declined    Lack of Transportation (Non-Medical): Patient declined  Physical Activity: Unknown (05/15/2023)   Exercise Vital Sign    Days of Exercise per Week: 0 days    Minutes of Exercise per Session: Not on file  Stress: Stress Concern Present (05/15/2023)   [employer] of Occupational Health - Occupational Stress Questionnaire    Feeling of Stress : Very much  Social Connections: Socially Isolated (05/15/2023)   Social Connection and Isolation Panel [NHANES]    Frequency of Communication with Friends and Family: Once a week    Frequency of Social Gatherings with Friends and Family: Never    Attends Religious Services: Never    Database administrator or Organizations: No    Attends Engineer, structural: Not on file    Marital Status: Never married  Catering manager Violence: Not on file    Family History  Problem Relation Age of Onset   Varicose Veins Mother    Dementia Mother    Parkinson's disease Father    Cancer Maternal Uncle        COLON     Review of Systems  Constitutional: Negative.  Negative for chills and fever.  HENT: Negative.  Negative for congestion and sore throat.   Respiratory: Negative.  Negative for cough and shortness of breath.   Cardiovascular: Negative.  Negative for  chest pain and palpitations.  Gastrointestinal:  Negative for abdominal pain, diarrhea, nausea and vomiting.  Genitourinary: Negative.  Negative for dysuria and hematuria.  Skin: Negative.  Negative for rash.  Neurological:  Positive for headaches.  All other systems reviewed and are negative.   Today's Vitals   05/15/23 1401  BP: 104/64  Pulse: 80  Temp: 98.3 F (36.8 C)  TempSrc: Oral  SpO2: 98%  Weight: 135 lb 4 oz (61.3 kg)  Height: 5\' 2"  (1.575 m)   Body mass index is 24.74 kg/m.   Physical Exam Vitals reviewed.  Constitutional:      Appearance: Normal appearance.  HENT:     Head: Normocephalic.     Mouth/Throat:     Mouth: Mucous membranes are moist.     Pharynx: Oropharynx is clear.  Eyes:     Extraocular Movements: Extraocular movements intact.     Conjunctiva/sclera: Conjunctivae normal.     Pupils: Pupils are equal, round, and reactive to  light.  Cardiovascular:     Rate and Rhythm: Normal rate and regular rhythm.     Pulses: Normal pulses.     Heart sounds: Normal heart sounds.  Pulmonary:     Effort: Pulmonary effort is normal.     Breath sounds: Normal breath sounds.  Musculoskeletal:     Cervical back: No tenderness.  Lymphadenopathy:     Cervical: No cervical adenopathy.  Skin:    General: Skin is warm and dry.     Capillary Refill: Capillary refill takes less than 2 seconds.  Neurological:     General: No focal deficit present.     Mental Status: She is alert and oriented to person, place, and time.  Psychiatric:        Mood and Affect: Mood normal.        Behavior: Behavior normal.      ASSESSMENT & PLAN: A total of 42 minutes was spent with the patient and counseling/coordination of care regarding preparing for this visit, review of most recent office visit notes, review of most recent neurologist office visit notes, review of multiple chronic medical conditions under management, review of all medications, review of most recent blood work  results, stress management, prognosis, documentation, and need for follow-up.  Problem List Items Addressed This Visit       Cardiovascular and Mediastinum   Intractable chronic migraine without aura and without status migrainosus    Follows up with neurologist on a regular basis Known triggers Medication helping Did not tolerate duloxetine prescribed by neurologist      Relevant Medications   escitalopram (LEXAPRO) 10 MG tablet     Other   Dysthymia - Primary    Mixed depression and anxiety Known triggers.  Mostly work related stress. Work note limiting working hours provided. Advised to not work more than 8-hour shifts and no more than 40 hours/week. Still active but much improved on Lexapro 10 mg We will continue medication at the same dose Recently tried on duloxetine but failed due to side effects. Stress management discussed.       Relevant Medications   escitalopram (LEXAPRO) 10 MG tablet   Patient Instructions  Mantenimiento de la salud en las mujeres Health Maintenance, Female Adoptar un estilo de vida saludable y recibir atencin preventiva son importantes para promover la salud y Counsellor. Consulte al mdico sobre: El esquema adecuado para hacerse pruebas y exmenes peridicos. Cosas que puede hacer por su cuenta para prevenir enfermedades y [place] sano. Qu debo saber sobre la dieta, el peso y el ejercicio? Consuma una dieta saludable  Consuma una dieta que incluya muchas verduras, frutas, productos lcteos con bajo contenido de [place] y Associate Professor. No consuma muchos alimentos ricos en grasas slidas, azcares agregados o sodio. Mantenga un peso saludable El ndice de masa muscular [facility]) se [place] para identificar problemas de [place]. Proporciona una estimacin de la grasa corporal basndose en el peso y la altura. Su mdico puede ayudarle a Engineer, site IMC y a Personnel officer o Pharmacologist un peso saludable. Haga ejercicio con regularidad Haga ejercicio  con regularidad. Esta es una de las prcticas ms importantes que puede hacer por su salud. La [employer] de los adultos deben seguir estas pautas: Education officer, environmental, al menos, 150 minutos de actividad fsica por semana. El ejercicio debe aumentar la frecuencia cardaca y Media planner transpirar (ejercicio de intensidad moderada). Hacer ejercicios de fortalecimiento por lo [employer] por semana. Agregue esto a su plan de ejercicio de intensidad moderada. Pase menos  tiempo sentada. Incluso la actividad fsica ligera puede ser beneficiosa. Controle sus niveles de colesterol y lpidos en la sangre Comience a realizarse anlisis de lpidos y Oncologist en la sangre a los 20 aos y luego reptalos cada 5 aos. Hgase controlar los niveles de colesterol con mayor frecuencia si: Sus niveles de lpidos y colesterol son altos. Es mayor de 40 aos. Presenta un alto riesgo de padecer enfermedades cardacas. Qu debo saber sobre las pruebas de deteccin del cncer? Segn su historia clnica y sus antecedentes familiares, es posible que deba realizarse pruebas de deteccin del cncer en diferentes edades. Esto puede incluir pruebas de deteccin de lo siguiente: Cncer de mama. Cncer de cuello uterino. Cncer colorrectal. Cncer de piel. Cncer de pulmn. Qu debo saber sobre la enfermedad cardaca, la diabetes y la hipertensin arterial? Presin arterial y enfermedad cardaca La hipertensin arterial causa enfermedades cardacas y [place] el riesgo de accidente cerebrovascular. Es ms probable que esto se manifieste en las personas que tienen lecturas de presin arterial alta o tienen sobrepeso. Hgase controlar la presin arterial: Cada 3 a 5 aos si tiene entre 18 y 32 aos. Todos los aos si es mayor de 40 aos. Diabetes Realcese exmenes de deteccin de la diabetes con regularidad. Este anlisis revisa el nivel de azcar en la sangre en [place]. Hgase las pruebas de deteccin: Cada tres aos despus de los  40 aos de edad si tiene un peso normal y un bajo riesgo de padecer diabetes. Con ms frecuencia y a partir de Maria Stein edad inferior si tiene sobrepeso o un alto riesgo de padecer diabetes. Qu debo saber sobre la prevencin de infecciones? Hepatitis B Si tiene un riesgo ms alto de contraer hepatitis B, debe someterse a un examen de deteccin de este virus. Hable con el mdico para averiguar si tiene riesgo de contraer la infeccin por hepatitis B. Hepatitis C Se recomienda el anlisis a: [employer] 1945 y 1965. Todas las personas que tengan un riesgo de haber contrado hepatitis C. Enfermedades de transmisin sexual (ETS) Hgase las pruebas de Airline pilot de ITS, incluidas la gonorrea y la clamidia, si: Es sexualmente activa y es menor de [address]. Es mayor de [address], y Public affairs consultant informa que corre riesgo de tener este tipo de infecciones. La actividad sexual ha cambiado desde que le hicieron la ltima prueba de deteccin y tiene un riesgo mayor de Warehouse manager clamidia o Copy. Pregntele al mdico si usted tiene riesgo. Pregntele al mdico si usted tiene un alto riesgo de Primary school teacher VIH. El mdico tambin puede recomendarle un medicamento recetado para ayudar a evitar la infeccin por el VIH. Si elige tomar medicamentos para prevenir el VIH, primero debe [employer] de deteccin del VIH. Luego debe hacerse anlisis cada 3 meses mientras est tomando los medicamentos. Embarazo Si est por dejar de Armed forces training and education officer (fase premenopusica) y usted puede quedar [place], busque asesoramiento antes de [place]. T[place] de 400 a 800 microgramos (mcg) de cido [employer] si [place]. Pida mtodos de control de la natalidad (anticonceptivos) si desea evitar un embarazo no deseado. Osteoporosis y [place] La osteoporosis es una enfermedad en la que los huesos pierden los minerales y la fuerza por el avance de la edad. El resultado pueden ser fracturas en los  [place]. Si tiene 65 aos o ms, o si est en riesgo de sufrir osteoporosis y fracturas, pregunte a su mdico si debe: Hacerse pruebas de deteccin de prdida sea. Tomar un suplemento  de calcio o de vitamina D para reducir el riesgo de fracturas. Recibir terapia de reemplazo hormonal (TRH) para tratar los sntomas de la menopausia. Siga estas indicaciones en su casa: Consumo de alcohol No beba alcohol si: Su mdico le indica no hacerlo. Est embarazada, puede estar embarazada o est tratando de [place]. Si bebe alcohol: Limite la cantidad que bebe a lo siguiente: De 0 a 1 bebida por da. Sepa cunta cantidad de alcohol hay en las bebidas que toma. En los [address], una medida equivale a una botella de cerveza de 12 oz (355 ml), un vaso de vino de 5 oz (148 ml) o un vaso de una bebida alcohlica de alta graduacin de 1 oz (44 ml). Estilo de vida No consuma ningn producto que contenga nicotina o tabaco. Estos productos incluyen cigarrillos, tabaco para Theatre manager y aparatos de vapeo, como los Administrator, Civil Service. Si necesita ayuda para dejar de consumir estos productos, consulte al mdico. No consuma drogas. No comparta agujas. Solicite ayuda a su mdico si necesita apoyo o informacin para abandonar las drogas. Indicaciones generales Realcese los estudios de rutina de [address], dentales y de Wellsite geologist. Mantngase al da con las vacunas. Infrmele a su mdico si: Se siente deprimida con frecuencia. Alguna vez ha sido vctima de [place] o no se siente seguro en su casa. Resumen Adoptar un estilo de vida saludable y recibir atencin preventiva son importantes para promover la salud y Counsellor. Siga las instrucciones del mdico acerca de una dieta saludable, el ejercicio y la realizacin de pruebas o exmenes para Hotel manager. Siga las instrucciones del mdico con respecto al control del colesterol y la presin arterial. Esta informacin no tiene Microbiologist el consejo del mdico. Asegrese de hacerle al mdico cualquier pregunta que tenga. Document Revised: 01/07/2021 Document Reviewed: 01/07/2021 Elsevier Patient Education  2024 Elsevier Inc.      Edwina Barth, MD [facility]

## 2023-05-27 ENCOUNTER — Emergency Department (HOSPITAL_BASED_OUTPATIENT_CLINIC_OR_DEPARTMENT_OTHER)
Admission: EM | Admit: 2023-05-27 | Discharge: 2023-05-27 | Disposition: A | Discharge status: Home / Self Care | Payer: Managed Care, Other (non HMO) | Attending: Emergency Medicine | Admitting: Emergency Medicine

## 2023-05-27 ENCOUNTER — Emergency Department (HOSPITAL_BASED_OUTPATIENT_CLINIC_OR_DEPARTMENT_OTHER): Payer: Managed Care, Other (non HMO)

## 2023-05-27 ENCOUNTER — Other Ambulatory Visit: Payer: Self-pay

## 2023-05-27 ENCOUNTER — Encounter (HOSPITAL_BASED_OUTPATIENT_CLINIC_OR_DEPARTMENT_OTHER): Payer: Self-pay

## 2023-05-27 DIAGNOSIS — E871 Hypo-osmolality and hyponatremia: Secondary | ICD-10-CM | POA: Insufficient documentation

## 2023-05-27 DIAGNOSIS — Z1152 Encounter for screening for COVID-19: Secondary | ICD-10-CM | POA: Insufficient documentation

## 2023-05-27 DIAGNOSIS — N12 Tubulo-interstitial nephritis, not specified as acute or chronic: Secondary | ICD-10-CM | POA: Diagnosis not present

## 2023-05-27 DIAGNOSIS — G43909 Migraine, unspecified, not intractable, without status migrainosus: Secondary | ICD-10-CM | POA: Insufficient documentation

## 2023-05-27 DIAGNOSIS — D649 Anemia, unspecified: Secondary | ICD-10-CM | POA: Diagnosis not present

## 2023-05-27 DIAGNOSIS — R519 Headache, unspecified: Secondary | ICD-10-CM | POA: Diagnosis present

## 2023-05-27 LAB — URINALYSIS, MICROSCOPIC (REFLEX)

## 2023-05-27 LAB — CBC
HCT: 34.6 % — ABNORMAL LOW (ref 36.0–46.0)
Hemoglobin: 11.7 g/dL — ABNORMAL LOW (ref 12.0–15.0)
MCH: 31.3 pg (ref 26.0–34.0)
MCHC: 33.8 g/dL (ref 30.0–36.0)
MCV: 92.5 fL (ref 80.0–100.0)
Platelets: 165 10*3/uL (ref 150–400)
RBC: 3.74 MIL/uL — ABNORMAL LOW (ref 3.87–5.11)
RDW: 11.9 % (ref 11.5–15.5)
WBC: 9 10*3/uL (ref 4.0–10.5)
nRBC: 0 % (ref 0.0–0.2)

## 2023-05-27 LAB — URINALYSIS, ROUTINE W REFLEX MICROSCOPIC
Bilirubin Urine: NEGATIVE
Glucose, UA: NEGATIVE mg/dL
Ketones, ur: 15 mg/dL — AB
Nitrite: NEGATIVE
Protein, ur: NEGATIVE mg/dL
Specific Gravity, Urine: 1.01 (ref 1.005–1.030)
pH: 7 (ref 5.0–8.0)

## 2023-05-27 LAB — COMPREHENSIVE METABOLIC PANEL
ALT: 92 U/L — ABNORMAL HIGH (ref 0–44)
AST: 94 U/L — ABNORMAL HIGH (ref 15–41)
Albumin: 3.4 g/dL — ABNORMAL LOW (ref 3.5–5.0)
Alkaline Phosphatase: 178 U/L — ABNORMAL HIGH (ref 38–126)
Anion gap: 13 (ref 5–15)
BUN: 11 mg/dL (ref 6–20)
CO2: 23 mmol/L (ref 22–32)
Calcium: 8.5 mg/dL — ABNORMAL LOW (ref 8.9–10.3)
Chloride: 94 mmol/L — ABNORMAL LOW (ref 98–111)
Creatinine, Ser: 0.76 mg/dL (ref 0.44–1.00)
GFR, Estimated: >60 mL/min (ref >60)
Glucose, Bld: 102 mg/dL — ABNORMAL HIGH (ref 70–99)
Potassium: 3.7 mmol/L (ref 3.5–5.1)
Sodium: 130 mmol/L — ABNORMAL LOW (ref 135–145)
Total Bilirubin: 0.9 mg/dL (ref 0.3–1.2)
Total Protein: 6.9 g/dL (ref 6.5–8.1)

## 2023-05-27 LAB — RESP PANEL BY RT-PCR (RSV, FLU A&B, COVID)  RVPGX2
Influenza A by PCR: NEGATIVE
Influenza B by PCR: NEGATIVE
Resp Syncytial Virus by PCR: NEGATIVE
SARS Coronavirus 2 by RT PCR: NEGATIVE

## 2023-05-27 LAB — PREGNANCY, URINE: Preg Test, Ur: NEGATIVE

## 2023-05-27 LAB — LIPASE, BLOOD: Lipase: 29 U/L (ref 11–51)

## 2023-05-27 MED ORDER — DIPHENHYDRAMINE HCL 50 MG/ML IJ SOLN
12.5000 mg | Freq: Once | INTRAMUSCULAR | Status: AC
  Administered 2023-05-27: 12.5 mg via INTRAVENOUS
  Filled 2023-05-27: qty 1

## 2023-05-27 MED ORDER — HYDROCODONE-ACETAMINOPHEN 5-325 MG PO TABS
2.0000 | ORAL_TABLET | ORAL | 0 refills | Status: DC | PRN
Start: 2023-05-27 — End: 2023-05-27

## 2023-05-27 MED ORDER — HYDROCODONE-ACETAMINOPHEN 5-325 MG PO TABS
2.0000 | ORAL_TABLET | ORAL | 0 refills | Status: DC | PRN
Start: 2023-05-27 — End: 2023-08-29

## 2023-05-27 MED ORDER — LACTATED RINGERS IV BOLUS
500.0000 mL | Freq: Once | INTRAVENOUS | Status: AC
  Administered 2023-05-27: 500 mL via INTRAVENOUS

## 2023-05-27 MED ORDER — IOHEXOL 300 MG/ML  SOLN
75.0000 mL | Freq: Once | INTRAMUSCULAR | Status: AC | PRN
  Administered 2023-05-27: 75 mL via INTRAVENOUS

## 2023-05-27 MED ORDER — PROCHLORPERAZINE EDISYLATE 10 MG/2ML IJ SOLN
10.0000 mg | Freq: Once | INTRAMUSCULAR | Status: AC
  Administered 2023-05-27: 10 mg via INTRAVENOUS
  Filled 2023-05-27: qty 2

## 2023-05-27 MED ORDER — SODIUM CHLORIDE 0.9 % IV SOLN
1.0000 g | Freq: Once | INTRAVENOUS | Status: AC
  Administered 2023-05-27: 1 g via INTRAVENOUS
  Filled 2023-05-27: qty 10

## 2023-05-27 MED ORDER — KETOROLAC TROMETHAMINE 15 MG/ML IJ SOLN
15.0000 mg | Freq: Once | INTRAMUSCULAR | Status: AC
  Administered 2023-05-27: 15 mg via INTRAVENOUS
  Filled 2023-05-27: qty 1

## 2023-05-27 MED ORDER — ACETAMINOPHEN 500 MG PO TABS
1000.0000 mg | ORAL_TABLET | Freq: Once | ORAL | Status: AC
  Administered 2023-05-27: 1000 mg via ORAL
  Filled 2023-05-27: qty 2

## 2023-05-27 MED ORDER — ONDANSETRON 4 MG PO TBDP: 4.0000 mg | ORAL_TABLET | Freq: Three times a day (TID) | ORAL | 0 refills | Status: DC | PRN

## 2023-05-27 MED ORDER — CEPHALEXIN 500 MG PO CAPS
500.0000 mg | ORAL_CAPSULE | Freq: Four times a day (QID) | ORAL | 0 refills | Status: DC
Start: 2023-05-27 — End: 2023-05-27

## 2023-05-27 MED ORDER — CEPHALEXIN 500 MG PO CAPS
500.0000 mg | ORAL_CAPSULE | Freq: Four times a day (QID) | ORAL | 0 refills | Status: AC
Start: 2023-05-27 — End: 2023-06-03

## 2023-05-27 NOTE — ED Provider Notes (Signed)
[name] [facility] Provider Note   CSN: [id number] Arrival date & time: 05/27/23  1323     History  Chief Complaint  Patient presents with   Migraine   Abdominal Pain    Desiree Krueger is a 52 y.o. female.  52 year old female with past medical history significant for migraine who presents today for evaluation of headache, body aches, abdominal pain, and fever with her significant other.  Symptoms started on Monday with headache and bodyaches.  Fever started yesterday.  Along with abdominal pain.  She endorses some dysuria as well started on Tuesday.  She was seen at urgent care and referred to the emergency department.  Denies any neck pain or neck stiffness.  No vision change but does endorse photophobia.  No history of kidney stones.  She also endorses left-sided flank pain.  The history is provided by the patient. No language interpreter was used.       Home Medications Prior to Admission medications   Medication Sig Start Date End Date Taking? Authorizing Provider  escitalopram (LEXAPRO) 10 MG tablet Take 1 tablet (10 mg total) by mouth daily. 05/15/23   Georgina Quint, MD  ondansetron (ZOFRAN-ODT) 4 MG disintegrating tablet Take 1 tablet (4 mg total) by mouth every 8 (eight) hours as needed for nausea or vomiting. 06/23/22   Ihor Austin, NP  rizatriptan (MAXALT-MLT) 10 MG disintegrating tablet DISSOLVE ONE TABLET BY MOUTH AS NEEDED; MAY REPEAT IN 2 HOURS IF NEEDED 12/12/22   Ihor Austin, NP  tiZANidine (ZANAFLEX) 4 MG tablet Take 1 tablet (4 mg total) by mouth every 6 (six) hours as needed for muscle spasms. 06/23/22   Ihor Austin, NP      Allergies    Patient has no known allergies.    Review of Systems   Review of Systems  Constitutional:  Positive for chills and fever. Negative for activity change and appetite change.  Eyes:  Positive for photophobia. Negative for visual disturbance.  Respiratory:  Negative  for cough and shortness of breath.   Cardiovascular:  Negative for chest pain.  Gastrointestinal:  Positive for abdominal pain. Negative for nausea and vomiting.  Genitourinary:  Positive for dysuria and flank pain. Negative for hematuria.  Neurological:  Positive for headaches.  All other systems reviewed and are negative.   Physical Exam Updated Vital Signs BP 101/60 (BP Location: Left Arm)   Pulse (!) 103   Temp 100.3 F (37.9 C) (Oral)   Resp 17   Ht 5\' 2"  (1.575 m)   Wt 60.8 kg   LMP 09/18/2017 (Exact Date) Comment: BTL  SpO2 99%   BMI 24.51 kg/m  Physical Exam Vitals and nursing note reviewed.  Constitutional:      General: She is not in acute distress.    Appearance: Normal appearance. She is not ill-appearing.  HENT:     Head: Normocephalic and atraumatic.     Nose: Nose normal.  Eyes:     Conjunctiva/sclera: Conjunctivae normal.  Cardiovascular:     Rate and Rhythm: Normal rate and regular rhythm.  Pulmonary:     Effort: Pulmonary effort is normal. No respiratory distress.     Breath sounds: Normal breath sounds. No wheezing.  Abdominal:     General: There is no distension.     Palpations: Abdomen is soft.     Tenderness: There is no abdominal tenderness. There is left CVA tenderness. There is no right CVA tenderness or guarding.  Musculoskeletal:  General: No deformity. Normal range of motion.     Cervical back: Normal range of motion.  Skin:    Findings: No rash.  Neurological:     General: No focal deficit present.     Mental Status: She is alert and oriented to person, place, and time. Mental status is at baseline.     Cranial Nerves: No cranial nerve deficit.     Motor: No weakness.     Comments: Negative meningeal signs     ED Results / Procedures / Treatments   Labs (all labs ordered are listed, but only abnormal results are displayed) Labs Reviewed  COMPREHENSIVE METABOLIC PANEL - Abnormal; Notable for the following components:       Result Value   Sodium 130 (*)    Chloride 94 (*)    Glucose, Bld 102 (*)    Calcium 8.5 (*)    Albumin 3.4 (*)    AST 94 (*)    ALT 92 (*)    Alkaline Phosphatase 178 (*)    All other components within normal limits  CBC - Abnormal; Notable for the following components:   RBC 3.74 (*)    Hemoglobin 11.7 (*)    HCT 34.6 (*)    All other components within normal limits  URINALYSIS, ROUTINE W REFLEX MICROSCOPIC - Abnormal; Notable for the following components:   Hgb urine dipstick MODERATE (*)    Ketones, ur 15 (*)    Leukocytes,Ua SMALL (*)    All other components within normal limits  URINALYSIS, MICROSCOPIC (REFLEX) - Abnormal; Notable for the following components:   Bacteria, UA MANY (*)    All other components within normal limits  RESP PANEL BY RT-PCR (RSV, FLU A&B, COVID)  RVPGX2  LIPASE, BLOOD  PREGNANCY, URINE    EKG EKG Interpretation Date/Time:  Saturday May 27 2023 13:44:54 EDT Ventricular Rate:  94 PR Interval:  144 QRS Duration:  69 QT Interval:  325 QTC Calculation: 407 R Axis:   82  Text Interpretation: Sinus rhythm since last tracing no significant change Confirmed by Rolan Bucco [phone]) on 05/27/2023 2:31:01 PM  Radiology No results found.  Procedures Procedures    Medications Ordered in ED Medications  lactated ringers bolus 500 mL (500 mLs Intravenous New Bag/Given 05/27/23 1512)  acetaminophen (TYLENOL) tablet 1,000 mg (1,000 mg Oral Given 05/27/23 1504)  ketorolac (TORADOL) 15 MG/ML injection 15 mg (15 mg Intravenous Given 05/27/23 1506)  prochlorperazine (COMPAZINE) injection 10 mg (10 mg Intravenous Given 05/27/23 1506)  diphenhydrAMINE (BENADRYL) injection 12.5 mg (12.5 mg Intravenous Given 05/27/23 1505)    ED Course/ Medical Decision Making/ A&P                                 Medical Decision Making Amount and/or Complexity of Data Reviewed Labs: ordered. Radiology: ordered.  Risk OTC drugs. Prescription drug  management.   Medical Decision Making / ED Course   This patient presents to the ED for concern of flank pain, fever, headache, dysuria, this involves an extensive number of treatment options, and is a complaint that carries with it a high risk of complications and morbidity.  The differential diagnosis includes gastroenteritis, viral URI, nephrolithiasis, pyelonephritis meningitis, migraine  MDM: 52 year old female presents today for fever, dysuria, flank pain, generalized myalgias, headache, and photophobia.  Will obtain CBC, CMP, respiratory panel, lipase, UA, pregnancy test, CT abdomen pelvis with contrast.  Will give 500 mL  fluid bolus, migraine cocktail as well as Tylenol.  CBC reveals no leukocytosis.  Mild anemia noted.  CMP with mild hyponatremia at 130, glucose 102.  Mild elevated LFTs.  UA with moderate hemoglobin, some ketones and small leukocytes.  0-5 WBCs.  Nitrite negative.  Pregnancy test negative.  Lipase within normal.  CT abdomen pelvis with contrast demonstrates evidence of pyelonephritis.  Will give patient dose of Rocephin.  Will discharge with Norco, Keflex, Zofran.  Patient agreeable with plan.  Strict return precautions given.  Patient's blood pressure soft but she states it is soft at baseline.  No evidence of sepsis.  She is appropriate for discharge.  Discharged in stable condition.   Lab Tests: -I ordered, reviewed, and interpreted labs.   The pertinent results include:   Labs Reviewed  COMPREHENSIVE METABOLIC PANEL - Abnormal; Notable for the following components:      Result Value   Sodium 130 (*)    Chloride 94 (*)    Glucose, Bld 102 (*)    Calcium 8.5 (*)    Albumin 3.4 (*)    AST 94 (*)    ALT 92 (*)    Alkaline Phosphatase 178 (*)    All other components within normal limits  CBC - Abnormal; Notable for the following components:   RBC 3.74 (*)    Hemoglobin 11.7 (*)    HCT 34.6 (*)    All other components within normal limits  URINALYSIS,  ROUTINE W REFLEX MICROSCOPIC - Abnormal; Notable for the following components:   Hgb urine dipstick MODERATE (*)    Ketones, ur 15 (*)    Leukocytes,Ua SMALL (*)    All other components within normal limits  URINALYSIS, MICROSCOPIC (REFLEX) - Abnormal; Notable for the following components:   Bacteria, UA MANY (*)    All other components within normal limits  RESP PANEL BY RT-PCR (RSV, FLU A&B, COVID)  RVPGX2  LIPASE, BLOOD  PREGNANCY, URINE      EKG  EKG Interpretation Date/Time:  Saturday May 27 2023 13:44:54 EDT Ventricular Rate:  94 PR Interval:  144 QRS Duration:  69 QT Interval:  325 QTC Calculation: 407 R Axis:   82  Text Interpretation: Sinus rhythm since last tracing no significant change Confirmed by Rolan Bucco [phone]) on 05/27/2023 2:31:01 PM         Imaging Studies ordered: I ordered imaging studies including CT abdomen pelvis with contrast I independently visualized and interpreted imaging. I agree with the radiologist interpretation   Medicines ordered and prescription drug management: Meds ordered this encounter  Medications   lactated ringers bolus 500 mL   acetaminophen (TYLENOL) tablet 1,000 mg   ketorolac (TORADOL) 15 MG/ML injection 15 mg   prochlorperazine (COMPAZINE) injection 10 mg   diphenhydrAMINE (BENADRYL) injection 12.5 mg   iohexol (OMNIPAQUE) 300 MG/ML solution 75 mL   HYDROcodone-acetaminophen (NORCO/VICODIN) 5-325 MG tablet    Sig: Take 2 tablets by mouth every 4 (four) hours as needed.    Dispense:  10 tablet    Refill:  0    Order Specific Question:   Supervising Provider    Answer:   MILLER, BRIAN [[id number]]   ondansetron (ZOFRAN-ODT) 4 MG disintegrating tablet    Sig: Take 1 tablet (4 mg total) by mouth every 8 (eight) hours as needed.    Dispense:  20 tablet    Refill:  0    Order Specific Question:   Supervising Provider    Answer:   Eber Hong [[id number]]  cephALEXin (KEFLEX) 500 MG capsule    Sig: Take 1 capsule (500  mg total) by mouth 4 (four) times daily for 7 days.    Dispense:  28 capsule    Refill:  0    Order Specific Question:   Supervising Provider    Answer:   MILLER, BRIAN [[id number]]   cefTRIAXone (ROCEPHIN) 1 g in sodium chloride 0.9 % 100 mL IVPB    Order Specific Question:   Antibiotic Indication:    Answer:   Other Indication (list below)    Order Specific Question:   Other Indication:    Answer:   pyelonephritis    -I have reviewed the patients home medicines and have made adjustments as needed  Cardiac Monitoring: The patient was maintained on a cardiac monitor.  I personally viewed and interpreted the cardiac monitored which showed an underlying rhythm of: Sinus rhythm  Reevaluation: After the interventions noted above, I reevaluated the patient and found that they have :improved  Co morbidities that complicate the patient evaluation  Past Medical History:  Diagnosis Date   Anemia    Constipation    Depression    Dysrhythmia    GERD (gastroesophageal reflux disease)    Headache    MIGRAINES   Low blood pressure    Lower back pain    Thyroid disease    Varicose vein of leg    BILATERAL      Dispostion: Patient discharged in stable condition.  Return precaution discussed.  Patient and significant other voiced understanding and are in agreement with plan.  Final Clinical Impression(s) / ED Diagnoses Final diagnoses:  Migraine without status migrainosus, not intractable, unspecified migraine type  Pyelonephritis    Rx / DC Orders ED Discharge Orders          Ordered    HYDROcodone-acetaminophen (NORCO/VICODIN) 5-325 MG tablet  Every 4 hours PRN        05/27/23 1759    ondansetron (ZOFRAN-ODT) 4 MG disintegrating tablet  Every 8 hours PRN        05/27/23 1759    cephALEXin (KEFLEX) 500 MG capsule  4 times daily        05/27/23 1759              Marita Kansas, PA-C 05/27/23 1800    Rolan Bucco, MD 05/28/23 [phone]

## 2023-05-27 NOTE — Discharge Instructions (Addendum)
Your workup today showed you have pyelonephritis or also known as kidney infection.  You received a dose of antibiotic in the emergency room.  I have sent antibiotic into the pharmacy for you along with pain medication and nausea medication.  For any concerning symptoms return to the emergency room I recommend you drink plenty of fluids.  Follow-up with your primary care provider.

## 2023-05-27 NOTE — ED Triage Notes (Signed)
The patient has had body aches, headaches , ABD pain since Tuesday. She stated that she was seen at [facility] and they wanted her to come her for eval. They stated she had blood in her urine.

## 2023-05-28 LAB — URINE CULTURE: Culture: NO GROWTH

## 2023-05-30 ENCOUNTER — Other Ambulatory Visit: Payer: Self-pay

## 2023-05-30 ENCOUNTER — Encounter (HOSPITAL_BASED_OUTPATIENT_CLINIC_OR_DEPARTMENT_OTHER): Payer: Self-pay | Admitting: Urology

## 2023-05-30 ENCOUNTER — Emergency Department (HOSPITAL_BASED_OUTPATIENT_CLINIC_OR_DEPARTMENT_OTHER)
Admission: EM | Admit: 2023-05-30 | Discharge: 2023-05-30 | Disposition: A | Discharge status: Home / Self Care | Payer: Managed Care, Other (non HMO) | Attending: Emergency Medicine | Admitting: Emergency Medicine

## 2023-05-30 DIAGNOSIS — R109 Unspecified abdominal pain: Secondary | ICD-10-CM | POA: Insufficient documentation

## 2023-05-30 DIAGNOSIS — R519 Headache, unspecified: Secondary | ICD-10-CM | POA: Insufficient documentation

## 2023-05-30 DIAGNOSIS — R110 Nausea: Secondary | ICD-10-CM | POA: Diagnosis not present

## 2023-05-30 MED ORDER — DEXAMETHASONE SODIUM PHOSPHATE 10 MG/ML IJ SOLN
10.0000 mg | Freq: Once | INTRAMUSCULAR | Status: AC
  Administered 2023-05-30: 10 mg via INTRAVENOUS
  Filled 2023-05-30: qty 1

## 2023-05-30 MED ORDER — METOCLOPRAMIDE HCL 5 MG/ML IJ SOLN
10.0000 mg | Freq: Once | INTRAMUSCULAR | Status: AC
  Administered 2023-05-30: 10 mg via INTRAVENOUS
  Filled 2023-05-30: qty 2

## 2023-05-30 MED ORDER — DIPHENHYDRAMINE HCL 50 MG/ML IJ SOLN
25.0000 mg | Freq: Once | INTRAMUSCULAR | Status: AC
  Administered 2023-05-30: 25 mg via INTRAVENOUS
  Filled 2023-05-30: qty 1

## 2023-05-30 NOTE — ED Triage Notes (Signed)
Pt seen Saturday 10/12 for kidney infection, now experiencing headaches Took Norco x 2 at 1100 with no relief  H/o migraines

## 2023-05-30 NOTE — Discharge Instructions (Signed)
Please read and follow all provided instructions.  Your diagnoses today include:  1. Acute nonintractable headache, unspecified headache type     Tests performed today include: Vital signs. See below for your results today.   Medications:  In the Emergency Department you received: Reglan - antinausea/headache medication Benadryl - antihistamine to counteract potential side effects of reglan Decadron -anti-inflammatory steroid medication which may help prevent recurrent headaches  Take any prescribed medications only as directed.  Additional information:  Follow any educational materials contained in this packet.  You are having a headache. No specific cause was found today for your headache. It may have been a migraine or other cause of headache. Stress, anxiety, fatigue, and depression are common triggers for headaches.   Your headache today does not appear to be life-threatening or require hospitalization, but often the exact cause of headaches is not determined in the emergency department. Therefore, follow-up with your doctor is very important to find out what may have caused your headache and whether or not you need any further diagnostic testing or treatment.   Sometimes headaches can appear benign (not harmful), but then more serious symptoms can develop which should prompt an immediate re-evaluation by your doctor or the emergency department.  BE VERY CAREFUL not to take multiple medicines containing Tylenol (also called acetaminophen). Doing so can lead to an overdose which can damage your liver and cause liver failure and possibly death.   Follow-up instructions: Please follow-up with your primary care provider in the next 3 days for further evaluation of your symptoms.   Return instructions:  Please return to the Emergency Department if you experience worsening symptoms. Return if the medications do not resolve your headache, if it recurs, or if you have multiple episodes  of vomiting or cannot keep down fluids. Return if you have a change from the usual headache. RETURN IMMEDIATELY IF you: Develop a sudden, severe headache Develop confusion or become poorly responsive or faint Develop a fever above 100.62F or problem breathing Have a change in speech, vision, swallowing, or understanding Develop new weakness, numbness, tingling, incoordination in your arms or legs Have a seizure Please return if you have any other emergent concerns.  Additional Information:  Your vital signs today were: BP 95/63 (BP Location: Left Arm)   Pulse 64   Temp 97.8 F (36.6 C)   Resp 20   Ht 5\' 2"  (1.575 m)   Wt 60.7 kg   LMP 09/18/2017 (Exact Date) Comment: BTL  SpO2 97%   BMI 24.48 kg/m  If your blood pressure (BP) was elevated above 135/85 this visit, please have this repeated by your doctor within one month. --------------

## 2023-05-30 NOTE — ED Provider Notes (Signed)
[place] [facility] Provider Note   CSN: [id number] Arrival date & time: 05/30/23  1405     History  Chief Complaint  Patient presents with   Headache    Desiree Krueger is a 52 y.o. female.  Patient presents to the emergency forme today for an ongoing headache.  She has a history of migraines.  Patient was seen in the emergency department 3 days ago for abdominal pain, flank pain, headache.  Patient had a inconclusive UA but abdominal CT with signs of possible left pyelonephritis.  Patient was started on Keflex and given Vicodin.  She was also given a migraine cocktail of Compazine, Toradol and Benadryl while in the emergency department.  This helped her symptoms, however she woke up the next morning with ongoing headache.  Headache is across the top of her head and down into the occipital area.  She has had nausea but no vomiting.  Her abdominal pain and fevers are improving.  No weakness, numbness, or tingling in her legs.  Patient takes rizatriptan for abortive therapy for her headaches.  She last took Keflex and pain medication around 11 AM today.       Home Medications Prior to Admission medications   Medication Sig Start Date End Date Taking? Authorizing Provider  cephALEXin (KEFLEX) 500 MG capsule Take 1 capsule (500 mg total) by mouth 4 (four) times daily for 7 days. 05/27/23 06/03/23  Karie [place], Amjad, PA-C  escitalopram (LEXAPRO) 10 MG tablet Take 1 tablet (10 mg total) by mouth daily. 05/15/23   Georgina Quint, MD  HYDROcodone-acetaminophen (NORCO/VICODIN) 5-325 MG tablet Take 2 tablets by mouth every 4 (four) hours as needed. 05/27/23   Karie Mainland, Amjad, PA-C  ondansetron (ZOFRAN-ODT) 4 MG disintegrating tablet Take 1 tablet (4 mg total) by mouth every 8 (eight) hours as needed. 05/27/23   Karie Mainland, Amjad, PA-C  rizatriptan (MAXALT-MLT) 10 MG disintegrating tablet DISSOLVE ONE TABLET BY MOUTH AS NEEDED; MAY REPEAT IN 2 HOURS IF NEEDED  12/12/22   Ihor Austin, NP  tiZANidine (ZANAFLEX) 4 MG tablet Take 1 tablet (4 mg total) by mouth every 6 (six) hours as needed for muscle spasms. 06/23/22   Ihor Austin, NP      Allergies    Patient has no known allergies.    Review of Systems   Review of Systems  Physical Exam Updated Vital Signs BP 95/63 (BP Location: Left Arm)   Pulse 64   Temp 97.8 F (36.6 C)   Resp 20   Ht 5\' 2"  (1.575 m)   Wt 60.7 kg   LMP 09/18/2017 (Exact Date) Comment: BTL  SpO2 97%   BMI 24.48 kg/m   Physical Exam Vitals and nursing note reviewed.  Constitutional:      Appearance: She is well-developed.  HENT:     Head: Normocephalic and atraumatic.     Right Ear: Tympanic membrane, ear canal and external ear normal.     Left Ear: Tympanic membrane, ear canal and external ear normal.     Nose: Nose normal.     Mouth/Throat:     Pharynx: Uvula midline.  Eyes:     General: Lids are normal.     Extraocular Movements:     Right eye: No nystagmus.     Left eye: No nystagmus.     Conjunctiva/sclera: Conjunctivae normal.     Pupils: Pupils are equal, round, and reactive to light.  Neck:     Meningeal: Brudzinski's sign and  Kernig's sign absent.  Cardiovascular:     Rate and Rhythm: Normal rate and regular rhythm.  Pulmonary:     Effort: Pulmonary effort is normal.     Breath sounds: Normal breath sounds.  Abdominal:     Palpations: Abdomen is soft.     Tenderness: There is no abdominal tenderness.  Musculoskeletal:     Cervical back: Normal range of motion and neck supple. No tenderness or bony tenderness.  Skin:    General: Skin is warm and dry.  Neurological:     Mental Status: She is alert and oriented to person, place, and time.     GCS: GCS eye subscore is 4. GCS verbal subscore is 5. GCS motor subscore is 6.     Cranial Nerves: No cranial nerve deficit.     Sensory: No sensory deficit.     Motor: No weakness.     Coordination: Coordination normal.     Gait: Gait normal.      Comments: Upper extremity myotomes tested bilaterally:  C5 Shoulder abduction 5/5 C6 Elbow flexion/wrist extension 5/5 C7 Elbow extension 5/5 C8 Finger flexion 5/5 T1 Finger abduction 5/5  Lower extremity myotomes tested bilaterally: L2 Hip flexion 5/5 L3 Knee extension 5/5 L4 Ankle dorsiflexion 5/5 S1 Ankle plantar flexion 5/5      ED Results / Procedures / Treatments   Labs (all labs ordered are listed, but only abnormal results are displayed) Labs Reviewed - No data to display  EKG None  Radiology No results found.  Procedures Procedures    Medications Ordered in ED Medications  metoCLOPramide (REGLAN) injection 10 mg (has no administration in time range)  diphenhydrAMINE (BENADRYL) injection 25 mg (has no administration in time range)  dexamethasone (DECADRON) injection 10 mg (has no administration in time range)    ED Course/ Medical Decision Making/ A&P    Patient seen and examined. History obtained directly from patient.  Reviewed previous ED workup and CT results.  Patient with very reassuring exam today.  Labs/EKG: None ordered  Imaging: None ordered  Medications/Fluids: Ordered: IV Reglan, IV Benadryl, IV Decadron.   Most recent vital signs reviewed and are as follows: BP 95/63 (BP Location: Left Arm)   Pulse 64   Temp 97.8 F (36.6 C)   Resp 20   Ht 5\' 2"  (1.575 m)   Wt 60.7 kg   LMP 09/18/2017 (Exact Date) Comment: BTL  SpO2 97%   BMI 24.48 kg/m   Initial impression: Headache, normal neurologic exam  3:39 PM Reassessment performed. Patient appears stable.  She was asleep.  Reports that her headache is improving.  She is comfortable discharge to home to rest.  Reviewed pertinent lab work and imaging with patient at bedside. Questions answered.   Most current vital signs reviewed and are as follows: BP 95/63 (BP Location: Left Arm)   Pulse 64   Temp 97.8 F (36.6 C)   Resp 20   Ht 5\' 2"  (1.575 m)   Wt 60.7 kg   LMP 09/18/2017  (Exact Date) Comment: BTL  SpO2 97%   BMI 24.48 kg/m   Plan: Discharge to home.   Prescriptions written for: None  Other home care instructions discussed: Continuing treatment for recent diagnosis of pyelonephritis.  Encouraged rest and good hydration, good oral intake.  ED return instructions discussed: Patient counseled to return if they have weakness in their arms or legs, slurred speech, trouble walking or talking, confusion, trouble with their balance, or if they have any other  concerns. Patient verbalizes understanding and agrees with plan.   Follow-up instructions discussed: Patient encouraged to follow-up with their PCP in 3 days.                                 Medical Decision Making Risk Prescription drug management.   In regards to the patient's headache, critical differentials were considered including subarachnoid hemorrhage, intracerebral hemorrhage, epidural/subdural hematoma, pituitary apoplexy, vertebral/carotid artery dissection, giant cell arteritis, central venous thrombosis, reversible cerebral vasoconstriction, acute angle closure glaucoma, idiopathic intracranial hypertension, bacterial meningitis, viral encephalitis, carbon monoxide poisoning, posterior reversible encephalopathy syndrome, pre-eclampsia.   Reg flag symptoms related to these causes were considered including systemic symptoms (fever, weight loss), neurologic symptoms (confusion, mental status change, vision change, associated seizure), acute or sudden "thunderclap" onset, patient age 27 or older with new or progressive headache, patient of any age with first headache or change in headache pattern, pregnant or postpartum status, history of HIV or other immunocompromise, history of cancer, headache occurring with exertion, associated neck or shoulder pain, associated traumatic injury, concurrent use of anticoagulation, family history of spontaneous SAH, and concurrent drug use.    Other benign, more  common causes of headache were considered including migraine, tension-type headache, cluster headache, referred pain from other cause such as sinus infection, dental pain, trigeminal neuralgia.   On exam, patient has a reassuring neuro exam including baseline mental status, no significant neck pain or meningeal signs, no signs of severe infection or fever.   The patient's vital signs, pertinent lab work and imaging were reviewed and interpreted as discussed in the ED course. Hospitalization was considered for further testing, treatments, or serial exams/observation. However as patient is well-appearing, has a stable exam over the course of their evaluation, and reassuring studies today, I do not feel that they warrant admission at this time. This plan was discussed with the patient who verbalizes agreement and comfort with this plan and seems reliable and able to return to the Emergency Department with worsening or changing symptoms.          Final Clinical Impression(s) / ED Diagnoses Final diagnoses:  Acute nonintractable headache, unspecified headache type    Rx / DC Orders ED Discharge Orders     None         Renne Crigler, PA-C 05/30/23 1540    Virgina Norfolk, DO 06/02/23 1555

## 2023-05-31 ENCOUNTER — Ambulatory Visit (INDEPENDENT_AMBULATORY_CARE_PROVIDER_SITE_OTHER): Payer: Managed Care, Other (non HMO) | Admitting: Emergency Medicine

## 2023-05-31 ENCOUNTER — Encounter: Payer: Self-pay | Admitting: Emergency Medicine

## 2023-05-31 VITALS — BP 98/64 | HR 74 | Temp 97.8°F | Wt 136.6 lb

## 2023-05-31 DIAGNOSIS — N12 Tubulo-interstitial nephritis, not specified as acute or chronic: Secondary | ICD-10-CM | POA: Insufficient documentation

## 2023-05-31 DIAGNOSIS — G43719 Chronic migraine without aura, intractable, without status migrainosus: Secondary | ICD-10-CM

## 2023-05-31 LAB — URINALYSIS
Bilirubin Urine: NEGATIVE
Ketones, ur: NEGATIVE
Leukocytes,Ua: NEGATIVE
Nitrite: NEGATIVE
Specific Gravity, Urine: 1.01 (ref 1.000–1.030)
Total Protein, Urine: NEGATIVE
Urine Glucose: NEGATIVE
Urobilinogen, UA: 0.2 (ref 0.0–1.0)
pH: 7 (ref 5.0–8.0)

## 2023-05-31 LAB — COMPREHENSIVE METABOLIC PANEL
ALT: 65 U/L — ABNORMAL HIGH (ref 0–35)
AST: 21 U/L (ref 0–37)
Albumin: 3.9 g/dL (ref 3.5–5.2)
Alkaline Phosphatase: 187 U/L — ABNORMAL HIGH (ref 39–117)
BUN: 21 mg/dL (ref 6–23)
CO2: 30 meq/L (ref 19–32)
Calcium: 9.9 mg/dL (ref 8.4–10.5)
Chloride: 101 meq/L (ref 96–112)
Creatinine, Ser: 0.79 mg/dL (ref 0.40–1.20)
GFR: 86 mL/min (ref >60.00)
Glucose, Bld: 99 mg/dL (ref 70–99)
Potassium: 4.3 meq/L (ref 3.5–5.1)
Sodium: 137 meq/L (ref 135–145)
Total Bilirubin: 0.2 mg/dL (ref 0.2–1.2)
Total Protein: 7.6 g/dL (ref 6.0–8.3)

## 2023-05-31 LAB — CBC WITH DIFFERENTIAL/PLATELET
Basophils Absolute: 0 10*3/uL (ref 0.0–0.1)
Basophils Relative: 0.1 % (ref 0.0–3.0)
Eosinophils Absolute: 0 10*3/uL (ref 0.0–0.7)
Eosinophils Relative: 0.1 % (ref 0.0–5.0)
HCT: 39 % (ref 36.0–46.0)
Hemoglobin: 12.5 g/dL (ref 12.0–15.0)
Lymphocytes Relative: 14.8 % (ref 12.0–46.0)
Lymphs Abs: 2.2 10*3/uL (ref 0.7–4.0)
MCHC: 32.1 g/dL (ref 30.0–36.0)
MCV: 93.3 fL (ref 78.0–100.0)
Monocytes Absolute: 0.8 10*3/uL (ref 0.1–1.0)
Monocytes Relative: 5.3 % (ref 3.0–12.0)
Neutro Abs: 11.7 10*3/uL — ABNORMAL HIGH (ref 1.4–7.7)
Neutrophils Relative %: 79.7 % — ABNORMAL HIGH (ref 43.0–77.0)
Platelets: 314 10*3/uL (ref 150.0–400.0)
RBC: 4.18 Mil/uL (ref 3.87–5.11)
RDW: 12.5 % (ref 11.5–15.5)
WBC: 14.7 10*3/uL — ABNORMAL HIGH (ref 4.0–10.5)

## 2023-05-31 NOTE — Assessment & Plan Note (Signed)
Clinically improved.  Stable without complications. Responding to antibiotics well.  Continue cephalexin 500 mg 4 times a day until finished. Advised to rest and stay well-hydrated We will repeat blood work, urinalysis, and obtain urine culture today. ED precautions given Advised to contact the office if no better or worse during the next several days

## 2023-05-31 NOTE — Progress Notes (Signed)
Kalley Nicholl 52 y.o.   Chief Complaint  Patient presents with   Generalized Body Aches    Body aches, fever (peak 107), nausea. Was seen at [facility] and was prescribed cephALEXin, Hydrocodone-acetaminophen, and ondansetron. She was also diagnosed with a kidney infection during that visit. They performed covid and flu tests during her visit and both came out negative. Notes of memory issues.      HISTORY OF PRESENT ILLNESS: This is a 52 y.o. female here for follow-up of emergency department visit on 05/27/2023 when she presented with urinary symptoms and flank pain and was diagnosed with left-sided pyelonephritis.  Started on antibiotics. Also had migraine headache treated in the emergency department then and also again yesterday. Feels much better today. Continues to take cephalexin 500 mg 4 times a day  HPI   Prior to Admission medications   Medication Sig Start Date End Date Taking? Authorizing Provider  cephALEXin (KEFLEX) 500 MG capsule Take 1 capsule (500 mg total) by mouth 4 (four) times daily for 7 days. 05/27/23 06/03/23 Yes Ali, Amjad, PA-C  escitalopram (LEXAPRO) 10 MG tablet Take 1 tablet (10 mg total) by mouth daily. 05/15/23  Yes Jaiana Sheffer, Eilleen Kempf, MD  ondansetron (ZOFRAN-ODT) 4 MG disintegrating tablet Take 1 tablet (4 mg total) by mouth every 8 (eight) hours as needed. 05/27/23  Yes Ali, Amjad, PA-C  rizatriptan (MAXALT-MLT) 10 MG disintegrating tablet DISSOLVE ONE TABLET BY MOUTH AS NEEDED; MAY REPEAT IN 2 HOURS IF NEEDED 12/12/22  Yes McCue, Shanda Bumps, NP  tiZANidine (ZANAFLEX) 4 MG tablet Take 1 tablet (4 mg total) by mouth every 6 (six) hours as needed for muscle spasms. 06/23/22  Yes McCue, Shanda Bumps, NP  HYDROcodone-acetaminophen (NORCO/VICODIN) 5-325 MG tablet Take 2 tablets by mouth every 4 (four) hours as needed. Patient not taking: Reported on 05/31/2023 05/27/23   Marita Kansas, PA-C    No Known Allergies  Patient Active Problem List    Diagnosis Date Noted   Dysthymia 07/05/2022   Chronic idiopathic constipation 06/23/2022   Intractable chronic migraine without aura and without status migrainosus 08/18/2021   Postmenopausal 08/18/2021   Chronic bilateral low back pain without sciatica 02/10/2021   Chronic migraine w/o aura w/o status migrainosus, not intractable 12/24/2020   Headache, new daily persistent ([facility]) 12/24/2020   Iron deficiency 08/24/2017   Elevated liver function tests 03/16/2017   Gastroesophageal reflux disease without esophagitis 01/11/2017   High risk medication use 01/11/2017   History of hyperthyroidism 01/11/2017   Hyperthyroidism 01/11/2017   Anemia due to chronic blood loss 03/16/2016   Submucous leiomyoma of uterus 03/16/2016    Past Medical History:  Diagnosis Date   Anemia    Constipation    Depression    Dysrhythmia    GERD (gastroesophageal reflux disease)    Headache    MIGRAINES   Low blood pressure    Lower back pain    Thyroid disease    Varicose vein of leg    BILATERAL    Past Surgical History:  Procedure Laterality Date   DILATATION & CURETTAGE/HYSTEROSCOPY WITH MYOSURE N/A 01/31/2017   Procedure: DILATATION & CURETTAGE/HYSTEROSCOPY WITH MYOSURE;  Surgeon: Ok Edwards, MD;  Location: [facility];  Service: Gynecology;  Laterality: N/A;   DILATATION & CURETTAGE/HYSTEROSCOPY WITH MYOSURE N/A 09/29/2017   Procedure: DILATATION & CURETTAGE/HYSTEROSCOPY WITH MYOSURE RESECTION OF SUBMUCOSAL MYOMA;  Surgeon: Genia Del, MD;  Location: [facility];  Service: Gynecology;  Laterality: N/A;  request to follow 2nd case around  10am in [place] [facility] block time Requests one hour OR time   HYSTEROSCOPY WITH NOVASURE N/A 09/29/2017   Procedure: HYSTEROSCOPY WITH NOVASURE ENDOMETRIAL ABLATION;  Surgeon: Genia Del, MD;  Location: [facility];  Service: Gynecology;  Laterality: N/A;   TUBAL LIGATION      Social History   Socioeconomic  History   Marital status: Single    Spouse name: Not on file   Number of children: 2   Years of education: two years college   Highest education level: GED or equivalent  Occupational History   Occupation: Naval architect  Tobacco Use   Smoking status: Former    Types: Cigarettes   Smokeless tobacco: Never  Vaping Use   Vaping status: Never Used  Substance and Sexual Activity   Alcohol use: Not Currently   Drug use: Never   Sexual activity: Yes    Partners: Male    Birth control/protection: Post-menopausal, Surgical    Comment: BTL  Other Topics Concern   Not on file  Social History Narrative   Lives at home with her daughter.   Right-handed.   Caffeine use: 3 cups per day.   Social Determinants of Health   Financial Resource Strain: Patient Declined (05/15/2023)   Overall Financial Resource Strain (CARDIA)    Difficulty of Paying Living Expenses: Patient declined  Food Insecurity: Patient Declined (05/15/2023)   Hunger Vital Sign    Worried About Running Out of Food in the Last Year: Patient declined    Ran Out of Food in the Last Year: Patient declined  Transportation Needs: Patient Declined (05/15/2023)   PRAPARE - Administrator, Civil Service (Medical): Patient declined    Lack of Transportation (Non-Medical): Patient declined  Physical Activity: Unknown (05/15/2023)   Exercise Vital Sign    Days of Exercise per Week: 0 days    Minutes of Exercise per Session: Not on file  Stress: Stress Concern Present (05/15/2023)   [employer] of Occupational Health - Occupational Stress Questionnaire    Feeling of Stress : Very much  Social Connections: Unknown (05/27/2023)   Received from [facility]   Social Network    Social Network: Not on file  Recent Concern: Social Connections - Socially Isolated (05/15/2023)   Social Connection and Isolation Panel [NHANES]    Frequency of Communication with Friends and Family: Once a week    Frequency of Social Gatherings  with Friends and Family: Never    Attends Religious Services: Never    Database administrator or Organizations: No    Attends Engineer, structural: Not on file    Marital Status: Never married  Intimate Partner Violence: Unknown (05/27/2023)   Received from [facility]   HITS    Physically Hurt: Not on file    Insult or Talk Down To: Not on file    Threaten Physical Harm: Not on file    Scream or Curse: Not on file    Family History  Problem Relation Age of Onset   Varicose Veins Mother    Dementia Mother    Parkinson's disease Father    Cancer Maternal Uncle        COLON     Review of Systems  Constitutional: Negative.  Negative for chills and fever.  HENT: Negative.  Negative for congestion and sore throat.   Respiratory: Negative.  Negative for cough and shortness of breath.   Cardiovascular: Negative.  Negative for chest pain and leg swelling.  Gastrointestinal:  Negative for abdominal pain, diarrhea, nausea and vomiting.  Genitourinary: Negative.  Negative for dysuria, flank pain and hematuria.  Skin: Negative.  Negative for rash.  Neurological:  Positive for headaches.  All other systems reviewed and are negative.   Vitals:   05/31/23 1358  BP: 98/64  Pulse: 74  Temp: 97.8 F (36.6 C)  SpO2: 98%    Physical Exam Vitals reviewed.  Constitutional:      Appearance: Normal appearance.  HENT:     Head: Normocephalic.  Eyes:     Extraocular Movements: Extraocular movements intact.     Pupils: Pupils are equal, round, and reactive to light.  Cardiovascular:     Rate and Rhythm: Normal rate and regular rhythm.     Pulses: Normal pulses.     Heart sounds: Normal heart sounds.  Pulmonary:     Effort: Pulmonary effort is normal.     Breath sounds: Normal breath sounds.  Abdominal:     Palpations: Abdomen is soft.     Tenderness: There is no abdominal tenderness. There is no right CVA tenderness or left CVA tenderness.  Musculoskeletal:      Cervical back: No tenderness.  Lymphadenopathy:     Cervical: No cervical adenopathy.  Skin:    General: Skin is warm and dry.  Neurological:     General: No focal deficit present.     Mental Status: She is alert and oriented to person, place, and time.  Psychiatric:        Mood and Affect: Mood normal.        Behavior: Behavior normal.      ASSESSMENT & PLAN: A total of 42 minutes was spent with the patient and counseling/coordination of care regarding preparing for this visit, review of most recent office visit notes, review of most recent blood work results, review of most recent CT scan of abdomen and pelvis report, review of all medications, diagnosis of pyelonephritis and prognosis, need to stay well-hydrated, need to continue and finish antibiotics, prognosis, ED precautions, documentation and need for follow-up.  Problem List Items Addressed This Visit       Cardiovascular and Mediastinum   Intractable chronic migraine without aura and without status migrainosus    Recent flareups but currently under control.        Genitourinary   Pyelonephritis - Primary    Clinically improved.  Stable without complications. Responding to antibiotics well.  Continue cephalexin 500 mg 4 times a day until finished. Advised to rest and stay well-hydrated We will repeat blood work, urinalysis, and obtain urine culture today. ED precautions given Advised to contact the office if no better or worse during the next several days      Relevant Orders   Urine Culture   Comprehensive metabolic panel   CBC with Differential/Platelet   Urinalysis   Patient Instructions  Continue and finish antibiotic cephalexin Blood work and urine culture today Contact the office if no better or worse during the next several days Rest and stay well-hydrated  Pielonefritis en los adultos Pyelonephritis, Adult  La pielonefritis es una infeccin en el rin. Los riones son las partes del cuerpo que  ayudan a Sales promotion account executive. Pasan los desechos de la sangre a la [place]. Esta infeccin puede curarse rpidamente o durar [employer]. En la [employer], desaparece con el tratamiento y no causa otros problemas. Cules son las causas? Esta infeccin puede ser causada por grmenes (bacterias). Los grmenes pueden ir: Desde la vejiga  hasta el rin. Esto puede suceder despus de tener una infeccin en la vejiga. Desde la [employer]. Qu incrementa el riesgo? Es ms probable que tenga esta afeccin si: Est embarazada. Es Neomia Dear persona de edad avanzada. Tiene lo siguiente: Diabetes. Prostatitis. Esto es irritacin e hinchazn de la prstata. Clculos renales o en la vejiga. Otros problemas en el rin o en las partes del cuerpo que transportan la orina desde los riones hasta la vejiga (urteres). Cncer. Tiene colocado un tubo blando llamado "catter" en la vejiga. Es sexualmente activo. [place] un medicamento que [employer] espermatozoides (espermicida). Este puede usarse para Location manager. Ha tenido una infeccin urinaria (IU) antes. Cules son los signos o sntomas? Hacer pis con frecuencia. Sentir la necesidad de Geographical information systems officer de inmediato. Una sensacin de ardor o escozor al [employer]. Dolor en el vientre, en la espalda o en un costado. Fiebre o escalofros. Vmitos o ganas de vomitar. Orina de color oscuro o sangre en la orina. Cmo se trata? Pueden indicarle que tome antibiticos. Deber beber mucho lquido. Si la infeccin es grave, es posible que Agricultural consultant hospital. Es posible que le administren antibiticos y lquidos a travs de una va intravenosa (IV). En algunos casos, pueden necesitarse otros tratamientos. Siga estas instrucciones en su casa: Comida y bebida Beba suficiente lquido como para Pharmacologist la orina de color amarillo plido. Evite la cafena, el t y las [address] gaseosas (carbonatadas). Instrucciones generales Use los  medicamentos de venta libre y los recetados como se lo haya indicado el mdico. Termine los antibiticos aunque comience a sentirse mejor. Orine con frecuencia. No contenga la [employer]. Orine antes y despus de Management consultant. Si es [place], higiencese de adelante hacia atrs despus de defecar (tener una deposicin). Use cada papel solamente una vez. Concurra a todas las visitas de seguimiento. Su mdico querr asegurarse de que est mejorando. Comunquese con un mdico si: No mejora luego de 2 das. Sus sntomas empeoran. Tiene fiebre o escalofros. No puede tomar los medicamentos. Solicite ayuda de inmediato si: Vomita cada vez que come o bebe. Tiene un dolor muy intenso en la espalda o en el costado. Se siente muy dbil o se desmaya. Esta informacin no tiene Theme park manager el consejo del mdico. Asegrese de hacerle al mdico cualquier pregunta que tenga. Document Revised: 03/18/2022 Document Reviewed: 03/18/2022 Elsevier Patient Education  2024 Elsevier Inc.    Edwina Barth, MD [facility]

## 2023-05-31 NOTE — Assessment & Plan Note (Signed)
Recent flareups but currently under control.

## 2023-05-31 NOTE — Patient Instructions (Signed)
Continue and finish antibiotic cephalexin Blood work and urine culture today Contact the office if no better or worse during the next several days Rest and stay well-hydrated  Pielonefritis en los adultos Pyelonephritis, Adult  La pielonefritis es una infeccin en el rin. Los riones son las partes del cuerpo que ayudan a Sales promotion account executive. Pasan los desechos de la sangre a la [place]. Esta infeccin puede curarse rpidamente o durar [employer]. En la [employer], desaparece con el tratamiento y no causa otros problemas. Cules son las causas? Esta infeccin puede ser causada por grmenes (bacterias). Los grmenes pueden ir: Desde la vejiga hasta el rin. Esto puede suceder despus de tener una infeccin en la vejiga. Desde la [employer]. Qu incrementa el riesgo? Es ms probable que tenga esta afeccin si: Est embarazada. Es Neomia Dear persona de edad avanzada. Tiene lo siguiente: Diabetes. Prostatitis. Esto es irritacin e hinchazn de la prstata. Clculos renales o en la vejiga. Otros problemas en el rin o en las partes del cuerpo que transportan la orina desde los riones hasta la vejiga (urteres). Cncer. Tiene colocado un tubo blando llamado "catter" en la vejiga. Es sexualmente activo. [place] un medicamento que [employer] espermatozoides (espermicida). Este puede usarse para Location manager. Ha tenido una infeccin urinaria (IU) antes. Cules son los signos o sntomas? Hacer pis con frecuencia. Sentir la necesidad de Geographical information systems officer de inmediato. Una sensacin de ardor o escozor al [employer]. Dolor en el vientre, en la espalda o en un costado. Fiebre o escalofros. Vmitos o ganas de vomitar. Orina de color oscuro o sangre en la orina. Cmo se trata? Pueden indicarle que tome antibiticos. Deber beber mucho lquido. Si la infeccin es grave, es posible que Agricultural consultant hospital. Es posible que le administren antibiticos y lquidos a travs de  una va intravenosa (IV). En algunos casos, pueden necesitarse otros tratamientos. Siga estas instrucciones en su casa: Comida y bebida Beba suficiente lquido como para Pharmacologist la orina de color amarillo plido. Evite la cafena, el t y las [address] gaseosas (carbonatadas). Instrucciones generales Use los medicamentos de venta libre y los recetados como se lo haya indicado el mdico. Termine los antibiticos aunque comience a sentirse mejor. Orine con frecuencia. No contenga la [employer]. Orine antes y despus de Management consultant. Si es [place], higiencese de adelante hacia atrs despus de defecar (tener una deposicin). Use cada papel solamente una vez. Concurra a todas las visitas de seguimiento. Su mdico querr asegurarse de que est mejorando. Comunquese con un mdico si: No mejora luego de 2 das. Sus sntomas empeoran. Tiene fiebre o escalofros. No puede tomar los medicamentos. Solicite ayuda de inmediato si: Vomita cada vez que come o bebe. Tiene un dolor muy intenso en la espalda o en el costado. Se siente muy dbil o se desmaya. Esta informacin no tiene Theme park manager el consejo del mdico. Asegrese de hacerle al mdico cualquier pregunta que tenga. Document Revised: 03/18/2022 Document Reviewed: 03/18/2022 Elsevier Patient Education  2024 [employer].

## 2023-06-01 LAB — URINE CULTURE: Result:: NO GROWTH

## 2023-06-13 ENCOUNTER — Encounter: Payer: Self-pay | Admitting: Emergency Medicine

## 2023-06-13 ENCOUNTER — Telehealth (INDEPENDENT_AMBULATORY_CARE_PROVIDER_SITE_OTHER): Payer: Managed Care, Other (non HMO) | Admitting: Emergency Medicine

## 2023-06-13 DIAGNOSIS — Z87440 Personal history of urinary (tract) infections: Secondary | ICD-10-CM | POA: Insufficient documentation

## 2023-06-13 DIAGNOSIS — G43719 Chronic migraine without aura, intractable, without status migrainosus: Secondary | ICD-10-CM

## 2023-06-13 NOTE — Assessment & Plan Note (Signed)
Not having urinary symptoms. However patient concerned about pyelonephritis because of back pain Recommend urinalysis and urine culture and blood work Patient will stop by lab tomorrow morning to get this done. Needs office visit

## 2023-06-13 NOTE — Assessment & Plan Note (Signed)
Recently evaluated by neurologist Pain management discussed Needs to follow-up with neurologist Needs office visit

## 2023-06-13 NOTE — Progress Notes (Signed)
Telemedicine Encounter- SOAP NOTE Established Patient MyChart video encounter Patient: Home  Provider: Office   Patient present only  This video encounter was conducted with the patient's (or proxy's) verbal consent via video telecommunications: yes/no: Yes Patient was instructed to have this encounter in a suitably private space; and to only have persons present to whom they give permission to participate. In addition, patient identity was confirmed by use of name plus two identifiers (DOB and address).  No chief complaint on file.  Subjective  Desiree Krueger is a 52 y.o. female with history of chronic migraine headaches recently evaluated by neurologist complaining of persistent on and off headaches along with persistent back pain.  History of recent pyelonephritis.  Concerned about possible UTI but has no urinary symptoms.  Denies fever or chills. Has persistent back pain.  Headaches worse with emotional upsets and anxiety. No other associated symptoms.  No other complaints or medical concerns today. HPI ? Patient Active Problem List   Diagnosis Date Noted   Pyelonephritis 05/31/2023   Dysthymia 07/05/2022   Chronic idiopathic constipation 06/23/2022   Intractable chronic migraine without aura and without status migrainosus 08/18/2021   Postmenopausal 08/18/2021   Chronic bilateral low back pain without sciatica 02/10/2021   Chronic migraine w/o aura w/o status migrainosus, not intractable 12/24/2020   Headache, new daily persistent ([facility]) 12/24/2020   Iron deficiency 08/24/2017   Elevated liver function tests 03/16/2017   Gastroesophageal reflux disease without esophagitis 01/11/2017   High risk medication use 01/11/2017   History of hyperthyroidism 01/11/2017   Hyperthyroidism 01/11/2017   Anemia due to chronic blood loss 03/16/2016   Submucous leiomyoma of uterus 03/16/2016   Past Medical History:  Diagnosis Date   Anemia    Constipation    Depression     Dysrhythmia    GERD (gastroesophageal reflux disease)    Headache    MIGRAINES   Low blood pressure    Lower back pain    Thyroid disease    Varicose vein of leg    BILATERAL   Current Outpatient Medications  Medication Sig Dispense Refill   escitalopram (LEXAPRO) 10 MG tablet Take 1 tablet (10 mg total) by mouth daily. 90 tablet 3   HYDROcodone-acetaminophen (NORCO/VICODIN) 5-325 MG tablet Take 2 tablets by mouth every 4 (four) hours as needed. (Patient not taking: Reported on 05/31/2023) 10 tablet 0   ondansetron (ZOFRAN-ODT) 4 MG disintegrating tablet Take 1 tablet (4 mg total) by mouth every 8 (eight) hours as needed. 20 tablet 0   rizatriptan (MAXALT-MLT) 10 MG disintegrating tablet DISSOLVE ONE TABLET BY MOUTH AS NEEDED; MAY REPEAT IN 2 HOURS IF NEEDED 12 tablet 11   tiZANidine (ZANAFLEX) 4 MG tablet Take 1 tablet (4 mg total) by mouth every 6 (six) hours as needed for muscle spasms. 30 tablet 11   No current facility-administered medications for this visit.   No Known Allergies Social History   Socioeconomic History   Marital status: Single    Spouse name: Not on file   Number of children: 2   Years of education: two years college   Highest education level: GED or equivalent  Occupational History   Occupation: Naval architect  Tobacco Use   Smoking status: Former    Types: Cigarettes   Smokeless tobacco: Never  Vaping Use   Vaping status: Never Used  Substance and Sexual Activity   Alcohol use: Not Currently   Drug use: Never   Sexual activity: Yes    Partners: Male  Birth control/protection: Post-menopausal, Surgical    Comment: BTL  Other Topics Concern   Not on file  Social History Narrative   Lives at home with her daughter.   Right-handed.   Caffeine use: 3 cups per day.   Social Determinants of Health   Financial Resource Strain: Patient Declined (05/15/2023)   Overall Financial Resource Strain (CARDIA)    Difficulty of Paying Living Expenses: Patient  declined  Food Insecurity: Patient Declined (05/15/2023)   Hunger Vital Sign    Worried About Running Out of Food in the Last Year: Patient declined    Ran Out of Food in the Last Year: Patient declined  Transportation Needs: Patient Declined (05/15/2023)   PRAPARE - Administrator, Civil Service (Medical): Patient declined    Lack of Transportation (Non-Medical): Patient declined  Physical Activity: Unknown (05/15/2023)   Exercise Vital Sign    Days of Exercise per Week: 0 days    Minutes of Exercise per Session: Not on file  Stress: Stress Concern Present (05/15/2023)   [employer] of Occupational Health - Occupational Stress Questionnaire    Feeling of Stress : Very much  Social Connections: Unknown (05/27/2023)   Received from [facility]   Social Network    Social Network: Not on file  Recent Concern: Social Connections - Socially Isolated (05/15/2023)   Social Connection and Isolation Panel [NHANES]    Frequency of Communication with Friends and Family: Once a week    Frequency of Social Gatherings with Friends and Family: Never    Attends Religious Services: Never    Database administrator or Organizations: No    Attends Engineer, structural: Not on file    Marital Status: Never married  Intimate Partner Violence: Unknown (05/27/2023)   Received from [facility]   HITS    Physically Hurt: Not on file    Insult or Talk Down To: Not on file    Threaten Physical Harm: Not on file    Scream or Curse: Not on file   Review of Systems  Constitutional: Negative.  Negative for chills and fever.  HENT: Negative.  Negative for congestion and sore throat.   Respiratory: Negative.  Negative for cough and shortness of breath.   Cardiovascular: Negative.  Negative for chest pain and palpitations.  Gastrointestinal:  Negative for abdominal pain, diarrhea, nausea and vomiting.  Genitourinary: Negative.  Negative for dysuria, frequency, hematuria and urgency.   Musculoskeletal:  Positive for back pain.  Skin: Negative.  Negative for rash.  Neurological:  Positive for headaches.  All other systems reviewed and are negative.  Objective  Alert and oriented x 3 in no apparent respiratory distress Vitals as reported by the patient: There were no vitals filed for this visit. Problem List Items Addressed This Visit       Cardiovascular and Mediastinum   Intractable chronic migraine without aura and without status migrainosus - Primary    Recently evaluated by neurologist Pain management discussed Needs to follow-up with neurologist Needs office visit      Relevant Orders   CBC with Differential/Platelet   Comprehensive metabolic panel     Other   Recent urinary tract infection    Not having urinary symptoms. However patient concerned about pyelonephritis because of back pain Recommend urinalysis and urine culture and blood work Patient will stop by lab tomorrow morning to get this done. Needs office visit      Relevant Orders   Urinalysis   Urine  Culture   CBC with Differential/Platelet     I discussed the assessment and treatment plan with the patient. The patient was provided an opportunity to ask questions and all were answered. The patient agreed with the plan and demonstrated an understanding of the instructions.   The patient was advised to call back or seek an in-person evaluation if the symptoms worsen or if the condition fails to improve as anticipated.  I provided 32 minutes minutes of non-face-to-face time during this encounter including time preparing for this visit, review of most recent office visit notes, review of most recent neurologist office visit notes, review of chronic medical conditions under management, review of all medications, pain management, prognosis, documentation and need for follow-up  Dr. Edwina Barth Primary care [place] at [facility]

## 2023-06-14 LAB — COMPREHENSIVE METABOLIC PANEL
ALT: 56 U/L — ABNORMAL HIGH (ref 0–35)
AST: 35 U/L (ref 0–37)
Albumin: 4.1 g/dL (ref 3.5–5.2)
Alkaline Phosphatase: 103 U/L (ref 39–117)
BUN: 20 mg/dL (ref 6–23)
CO2: 34 meq/L — ABNORMAL HIGH (ref 19–32)
Calcium: 9.8 mg/dL (ref 8.4–10.5)
Chloride: 100 meq/L (ref 96–112)
Creatinine, Ser: 0.87 mg/dL (ref 0.40–1.20)
GFR: 76.58 mL/min (ref >60.00)
Glucose, Bld: 83 mg/dL (ref 70–99)
Potassium: 4.2 meq/L (ref 3.5–5.1)
Sodium: 140 meq/L (ref 135–145)
Total Bilirubin: 0.5 mg/dL (ref 0.2–1.2)
Total Protein: 7.4 g/dL (ref 6.0–8.3)

## 2023-06-14 LAB — URINALYSIS, ROUTINE W REFLEX MICROSCOPIC
Bilirubin Urine: NEGATIVE
Ketones, ur: NEGATIVE
Nitrite: NEGATIVE
Specific Gravity, Urine: 1.015 (ref 1.000–1.030)
Total Protein, Urine: NEGATIVE
Urine Glucose: NEGATIVE
Urobilinogen, UA: 0.2 (ref 0.0–1.0)
pH: 7 (ref 5.0–8.0)

## 2023-06-14 LAB — CBC WITH DIFFERENTIAL/PLATELET
Basophils Absolute: 0 10*3/uL (ref 0.0–0.1)
Basophils Relative: 0.8 % (ref 0.0–3.0)
Eosinophils Absolute: 0.1 10*3/uL (ref 0.0–0.7)
Eosinophils Relative: 2 % (ref 0.0–5.0)
HCT: 40.7 % (ref 36.0–46.0)
Hemoglobin: 13.3 g/dL (ref 12.0–15.0)
Lymphocytes Relative: 42.5 % (ref 12.0–46.0)
Lymphs Abs: 2 10*3/uL (ref 0.7–4.0)
MCHC: 32.8 g/dL (ref 30.0–36.0)
MCV: 93 fL (ref 78.0–100.0)
Monocytes Absolute: 0.3 10*3/uL (ref 0.1–1.0)
Monocytes Relative: 6 % (ref 3.0–12.0)
Neutro Abs: 2.3 10*3/uL (ref 1.4–7.7)
Neutrophils Relative %: 48.7 % (ref 43.0–77.0)
Platelets: 260 10*3/uL (ref 150.0–400.0)
RBC: 4.37 Mil/uL (ref 3.87–5.11)
RDW: 12.9 % (ref 11.5–15.5)
WBC: 4.7 10*3/uL (ref 4.0–10.5)

## 2023-06-16 ENCOUNTER — Other Ambulatory Visit: Payer: Self-pay | Admitting: Emergency Medicine

## 2023-06-16 DIAGNOSIS — B9620 Unspecified Escherichia coli [E. coli] as the cause of diseases classified elsewhere: Secondary | ICD-10-CM

## 2023-06-16 LAB — URINE CULTURE

## 2023-06-16 MED ORDER — CEFUROXIME AXETIL 500 MG PO TABS
500.0000 mg | ORAL_TABLET | Freq: Two times a day (BID) | ORAL | 0 refills | Status: AC
Start: 2023-06-16 — End: 2023-06-23

## 2023-07-03 ENCOUNTER — Telehealth (INDEPENDENT_AMBULATORY_CARE_PROVIDER_SITE_OTHER): Payer: Managed Care, Other (non HMO) | Admitting: Emergency Medicine

## 2023-07-03 ENCOUNTER — Encounter: Payer: Self-pay | Admitting: Emergency Medicine

## 2023-07-03 DIAGNOSIS — Z87440 Personal history of urinary (tract) infections: Secondary | ICD-10-CM

## 2023-07-03 DIAGNOSIS — N390 Urinary tract infection, site not specified: Secondary | ICD-10-CM | POA: Diagnosis not present

## 2023-07-03 DIAGNOSIS — B9620 Unspecified Escherichia coli [E. coli] as the cause of diseases classified elsewhere: Secondary | ICD-10-CM | POA: Diagnosis not present

## 2023-07-03 NOTE — Progress Notes (Signed)
Telemedicine Encounter- SOAP NOTE Established Patient MyChart video encounter Patient: Home  Provider: Office   Patient present only  This video encounter was conducted with the patient's (or proxy's) verbal consent via video telecommunications: yes/no: Yes Patient was instructed to have this encounter in a suitably private space; and to only have persons present to whom they give permission to participate. In addition, patient identity was confirmed by use of name plus two identifiers (DOB and address).  No chief complaint on file.  Subjective  Desiree Krueger is a 52 y.o. female established patient. Telephone visit today for follow-up of her recent E. coli urinary tract infection Still having some residual urinary symptoms with occasional headaches as well. No other complaints or medical concerns today. In the past she has been evaluated by both urologist and gynecologist with normal examinations.   HPI ? Patient Active Problem List   Diagnosis Date Noted   Recent urinary tract infection 06/13/2023   Pyelonephritis 05/31/2023   Dysthymia 07/05/2022   Chronic idiopathic constipation 06/23/2022   Intractable chronic migraine without aura and without status migrainosus 08/18/2021   Postmenopausal 08/18/2021   Chronic bilateral low back pain without sciatica 02/10/2021   Chronic migraine w/o aura w/o status migrainosus, not intractable 12/24/2020   Headache, new daily persistent ([facility]) 12/24/2020   Iron deficiency 08/24/2017   Elevated liver function tests 03/16/2017   Gastroesophageal reflux disease without esophagitis 01/11/2017   High risk medication use 01/11/2017   History of hyperthyroidism 01/11/2017   Hyperthyroidism 01/11/2017   Anemia due to chronic blood loss 03/16/2016   Submucous leiomyoma of uterus 03/16/2016   Past Medical History:  Diagnosis Date   Anemia    Constipation    Depression    Dysrhythmia    GERD (gastroesophageal reflux disease)     Headache    MIGRAINES   Low blood pressure    Lower back pain    Thyroid disease    Varicose vein of leg    BILATERAL   Current Outpatient Medications  Medication Sig Dispense Refill   escitalopram (LEXAPRO) 10 MG tablet Take 1 tablet (10 mg total) by mouth daily. 90 tablet 3   HYDROcodone-acetaminophen (NORCO/VICODIN) 5-325 MG tablet Take 2 tablets by mouth every 4 (four) hours as needed. (Patient not taking: Reported on 05/31/2023) 10 tablet 0   ondansetron (ZOFRAN-ODT) 4 MG disintegrating tablet Take 1 tablet (4 mg total) by mouth every 8 (eight) hours as needed. 20 tablet 0   rizatriptan (MAXALT-MLT) 10 MG disintegrating tablet DISSOLVE ONE TABLET BY MOUTH AS NEEDED; MAY REPEAT IN 2 HOURS IF NEEDED 12 tablet 11   tiZANidine (ZANAFLEX) 4 MG tablet Take 1 tablet (4 mg total) by mouth every 6 (six) hours as needed for muscle spasms. 30 tablet 11   No current facility-administered medications for this visit.   No Known Allergies Social History   Socioeconomic History   Marital status: Single    Spouse name: Not on file   Number of children: 2   Years of education: two years college   Highest education level: GED or equivalent  Occupational History   Occupation: Naval architect  Tobacco Use   Smoking status: Former    Types: Cigarettes   Smokeless tobacco: Never  Vaping Use   Vaping status: Never Used  Substance and Sexual Activity   Alcohol use: Not Currently   Drug use: Never   Sexual activity: Yes    Partners: Male    Birth control/protection: Post-menopausal, Surgical  Comment: BTL  Other Topics Concern   Not on file  Social History Narrative   Lives at home with her daughter.   Right-handed.   Caffeine use: 3 cups per day.   Social Determinants of Health   Financial Resource Strain: Patient Declined (05/15/2023)   Overall Financial Resource Strain (CARDIA)    Difficulty of Paying Living Expenses: Patient declined  Food Insecurity: Patient Declined (05/15/2023)    Hunger Vital Sign    Worried About Running Out of Food in the Last Year: Patient declined    Ran Out of Food in the Last Year: Patient declined  Transportation Needs: Patient Declined (05/15/2023)   PRAPARE - Administrator, Civil Service (Medical): Patient declined    Lack of Transportation (Non-Medical): Patient declined  Physical Activity: Unknown (05/15/2023)   Exercise Vital Sign    Days of Exercise per Week: 0 days    Minutes of Exercise per Session: Not on file  Stress: Stress Concern Present (05/15/2023)   [employer] of Occupational Health - Occupational Stress Questionnaire    Feeling of Stress : Very much  Social Connections: Unknown (05/27/2023)   Received from [facility]   Social Network    Social Network: Not on file  Recent Concern: Social Connections - Socially Isolated (05/15/2023)   Social Connection and Isolation Panel [NHANES]    Frequency of Communication with Friends and Family: Once a week    Frequency of Social Gatherings with Friends and Family: Never    Attends Religious Services: Never    Database administrator or Organizations: No    Attends Engineer, structural: Not on file    Marital Status: Never married  Intimate Partner Violence: Unknown (05/27/2023)   Received from [facility]   HITS    Physically Hurt: Not on file    Insult or Talk Down To: Not on file    Threaten Physical Harm: Not on file    Scream or Curse: Not on file   Review of Systems  Constitutional: Negative.  Negative for chills and fever.  HENT: Negative.  Negative for congestion and sore throat.   Respiratory: Negative.  Negative for cough and shortness of breath.   Cardiovascular: Negative.  Negative for chest pain and palpitations.  Gastrointestinal:  Negative for abdominal pain, diarrhea, nausea and vomiting.  Genitourinary:  Positive for frequency.       Occasional incontinence  Skin: Negative.  Negative for rash.  Neurological: Negative.   Negative for dizziness and headaches.   Objective  Vitals as reported by the patient: There were no vitals filed for this visit. Problem List Items Addressed This Visit       Genitourinary   E. coli UTI - Primary    Urine culture from 06/14/2023 grew E. Coli Treated with Ceftin for 7 days Still having residual symptoms Recommend to repeat urinalysis and urine culture Advised to stay well-hydrated ED precautions given      Relevant Orders   Urine Culture   Urinalysis     Other   Recent urinary tract infection    E. coli infection treated with Ceftin for 7 days Still having residual symptoms Need to repeat urinalysis and urine culture No signs or symptoms of pyelonephritis      Relevant Orders   Urine Culture   Urinalysis     I discussed the assessment and treatment plan with the patient. The patient was provided an opportunity to ask questions and all were answered. The  patient agreed with the plan and demonstrated an understanding of the instructions.   The patient was advised to call back or seek an in-person evaluation if the symptoms worsen or if the condition fails to improve as anticipated.  I provided 32 minutes minutes of non-face-to-face time during this encounter including time preparing for this visit, review of most recent office visit notes, review of most recent blood work and urine culture results, diagnosis of E. coli UTI and management, need for new urinalysis and urine culture, prognosis, ED precautions, documentation and need for follow-up  Dr. Lillie Fragmin primary care at [facility]

## 2023-07-03 NOTE — Assessment & Plan Note (Signed)
E. coli infection treated with Ceftin for 7 days Still having residual symptoms Need to repeat urinalysis and urine culture No signs or symptoms of pyelonephritis

## 2023-07-03 NOTE — Assessment & Plan Note (Signed)
Urine culture from 06/14/2023 grew E. Coli Treated with Ceftin for 7 days Still having residual symptoms Recommend to repeat urinalysis and urine culture Advised to stay well-hydrated ED precautions given

## 2023-07-04 ENCOUNTER — Other Ambulatory Visit (INDEPENDENT_AMBULATORY_CARE_PROVIDER_SITE_OTHER): Payer: Managed Care, Other (non HMO)

## 2023-07-04 ENCOUNTER — Telehealth: Payer: Self-pay | Admitting: Emergency Medicine

## 2023-07-04 DIAGNOSIS — N390 Urinary tract infection, site not specified: Secondary | ICD-10-CM

## 2023-07-04 DIAGNOSIS — B9620 Unspecified Escherichia coli [E. coli] as the cause of diseases classified elsewhere: Secondary | ICD-10-CM

## 2023-07-04 DIAGNOSIS — Z87440 Personal history of urinary (tract) infections: Secondary | ICD-10-CM | POA: Diagnosis not present

## 2023-07-04 LAB — URINALYSIS, ROUTINE W REFLEX MICROSCOPIC
Bilirubin Urine: NEGATIVE
Ketones, ur: NEGATIVE
Nitrite: NEGATIVE
Specific Gravity, Urine: 1.02 (ref 1.000–1.030)
Total Protein, Urine: NEGATIVE
Urine Glucose: NEGATIVE
Urobilinogen, UA: 0.2 (ref 0.0–1.0)
pH: 7.5 (ref 5.0–8.0)

## 2023-07-04 NOTE — Telephone Encounter (Signed)
Spoke with pharmacy to confirmation issue. Issue is with patients insurance, pharmacy states filing for 90 days is best benefit for patient but when trying to fill meds states 34 days is allowed. Patient is to reach out to her insurance about this issue

## 2023-07-04 NOTE — Telephone Encounter (Signed)
Pt requested a refill of her escitalopram (LEXAPRO) 10 MG tablet from the pharmacy but the pharmacy denied the refill b/c of the 90 day supply and told the pt she would need to speak with [place] before refilling it.   Please advise Pt: [phone]

## 2023-07-05 LAB — URINE CULTURE: Result:: NO GROWTH

## 2023-07-12 ENCOUNTER — Other Ambulatory Visit: Payer: Self-pay | Admitting: Emergency Medicine

## 2023-07-12 ENCOUNTER — Encounter: Payer: Self-pay | Admitting: Radiology

## 2023-07-12 DIAGNOSIS — F341 Dysthymic disorder: Secondary | ICD-10-CM

## 2023-07-12 MED ORDER — ESCITALOPRAM OXALATE 10 MG PO TABS: 10.0000 mg | ORAL_TABLET | Freq: Every day | ORAL | 3 refills | Status: DC

## 2023-07-12 NOTE — Telephone Encounter (Signed)
Notified patient through MyChart

## 2023-07-12 NOTE — Telephone Encounter (Signed)
Patient wants prescription sent in for 30 days - she has medicaid

## 2023-07-12 NOTE — Telephone Encounter (Signed)
Thank you.  New prescription for 30 days sent today.

## 2023-07-24 ENCOUNTER — Telehealth: Payer: Self-pay | Admitting: Emergency Medicine

## 2023-07-24 NOTE — Telephone Encounter (Signed)
Prescription Request  07/24/2023  LOV: 05/31/2023  What is the name of the medication or equipment?  escitalopram (LEXAPRO) 10 MG tablet [   Have you contacted your pharmacy to request a refill? No   Which pharmacy would you like this sent to?  [facility]  [address] [place], [place] [address] -  [phone]    Patient notified that their request is being sent to the clinical staff for review and that they should receive a response within 2 business days.   Please advise at Mobile [phone] (mobile)

## 2023-07-25 ENCOUNTER — Other Ambulatory Visit: Payer: Self-pay | Admitting: Emergency Medicine

## 2023-07-25 DIAGNOSIS — F341 Dysthymic disorder: Secondary | ICD-10-CM

## 2023-07-25 MED ORDER — ESCITALOPRAM OXALATE 10 MG PO TABS: 10.0000 mg | ORAL_TABLET | Freq: Every day | ORAL | 3 refills | Status: DC

## 2023-07-27 ENCOUNTER — Other Ambulatory Visit: Payer: Self-pay | Admitting: Emergency Medicine

## 2023-07-27 DIAGNOSIS — F341 Dysthymic disorder: Secondary | ICD-10-CM

## 2023-07-27 MED ORDER — ESCITALOPRAM OXALATE 10 MG PO TABS: 10.0000 mg | ORAL_TABLET | Freq: Every day | ORAL | 3 refills | Status: DC

## 2023-07-27 NOTE — Telephone Encounter (Signed)
Patient called back, the prescription was sent to [facility] where they are charging her $45, she would like it sent to [facility] on [address].

## 2023-07-27 NOTE — Telephone Encounter (Signed)
New prescription sent to pharmacy requested.  Thanks

## 2023-08-19 ENCOUNTER — Encounter: Payer: Self-pay | Admitting: Emergency Medicine

## 2023-08-29 ENCOUNTER — Telehealth: Payer: Managed Care, Other (non HMO) | Admitting: Emergency Medicine

## 2023-08-29 ENCOUNTER — Ambulatory Visit (INDEPENDENT_AMBULATORY_CARE_PROVIDER_SITE_OTHER): Payer: Managed Care, Other (non HMO) | Admitting: Emergency Medicine

## 2023-08-29 VITALS — BP 98/64 | HR 83 | Temp 98.1°F | Ht 62.0 in | Wt 140.0 lb

## 2023-08-29 DIAGNOSIS — M778 Other enthesopathies, not elsewhere classified: Secondary | ICD-10-CM | POA: Diagnosis not present

## 2023-08-29 DIAGNOSIS — M7712 Lateral epicondylitis, left elbow: Secondary | ICD-10-CM | POA: Insufficient documentation

## 2023-08-29 MED ORDER — MELOXICAM 15 MG PO TABS: 15.0000 mg | ORAL_TABLET | Freq: Every day | ORAL | 0 refills | Status: DC

## 2023-08-29 NOTE — Assessment & Plan Note (Signed)
 Very tender on physical exam Pain management discussed Recommend meloxicam 15 mg daily for 10 days Recommend orthopedic evaluation Referral placed today Work restrictions discussed.  Work note provided.

## 2023-08-29 NOTE — Progress Notes (Signed)
 Desiree Krueger 53 y.o.   Chief Complaint  Patient presents with   Arm Pain    Patient states she hurt her arm while working, lifting something on 08/16/2022. Patient states on the back of her left knee she is feeling the same pain but this has been going on for months.     HISTORY OF PRESENT ILLNESS: This is a 53 y.o. female complaining of pain to left elbow area that started about 10 days ago while lifting heavy box at work Also complaining of chronic pain to left knee area No other complaints or medical concerns today.  Arm Pain  Pertinent negatives include no chest pain.     Prior to Admission medications   Medication Sig Start Date End Date Taking? Authorizing Provider  escitalopram  (LEXAPRO ) 10 MG tablet Take 1 tablet (10 mg total) by mouth daily. 07/27/23  Yes Bayyinah Dukeman, Emil Schanz, MD  rizatriptan  (MAXALT -MLT) 10 MG disintegrating tablet DISSOLVE ONE TABLET BY MOUTH AS NEEDED; MAY REPEAT IN 2 HOURS IF NEEDED 12/12/22  Yes McCue, Harlene, NP  HYDROcodone -acetaminophen  (NORCO/VICODIN) 5-325 MG tablet Take 2 tablets by mouth every 4 (four) hours as needed. Patient not taking: Reported on 05/31/2023 05/27/23   Hildegard Loge, PA-C  ondansetron  (ZOFRAN -ODT) 4 MG disintegrating tablet Take 1 tablet (4 mg total) by mouth every 8 (eight) hours as needed. Patient not taking: Reported on 08/29/2023 05/27/23   Hildegard Loge, PA-C  tiZANidine  (ZANAFLEX ) 4 MG tablet Take 1 tablet (4 mg total) by mouth every 6 (six) hours as needed for muscle spasms. Patient not taking: Reported on 08/29/2023 06/23/22   Whitfield Harlene, NP    No Known Allergies  Patient Active Problem List   Diagnosis Date Noted   E. coli UTI 07/03/2023   Recent urinary tract infection 06/13/2023   Pyelonephritis 05/31/2023   Dysthymia 07/05/2022   Chronic idiopathic constipation 06/23/2022   Intractable chronic migraine without aura and without status migrainosus 08/18/2021   Postmenopausal 08/18/2021   Chronic  bilateral low back pain without sciatica 02/10/2021   Chronic migraine w/o aura w/o status migrainosus, not intractable 12/24/2020   Headache, new daily persistent (ND[facility]) 12/24/2020   Iron deficiency 08/24/2017   Elevated liver function tests 03/16/2017   Gastroesophageal reflux disease without esophagitis 01/11/2017   High risk medication use 01/11/2017   History of hyperthyroidism 01/11/2017   Hyperthyroidism 01/11/2017   Anemia due to chronic blood loss 03/16/2016   Submucous leiomyoma of uterus 03/16/2016    Past Medical History:  Diagnosis Date   Anemia    Constipation    Depression    Dysrhythmia    GERD (gastroesophageal reflux disease)    Headache    MIGRAINES   Low blood pressure    Lower back pain    Thyroid  disease    Varicose vein of leg    BILATERAL    Past Surgical History:  Procedure Laterality Date   DILATATION & CURETTAGE/HYSTEROSCOPY WITH MYOSURE N/A 01/31/2017   Procedure: DILATATION & CURETTAGE/HYSTEROSCOPY WITH MYOSURE;  Surgeon: Winfred Curlee DEL, MD;  Location: [facility];  Service: Gynecology;  Laterality: N/A;   DILATATION & CURETTAGE/HYSTEROSCOPY WITH MYOSURE N/A 09/29/2017   Procedure: DILATATION & CURETTAGE/HYSTEROSCOPY WITH MYOSURE RESECTION OF SUBMUCOSAL MYOMA;  Surgeon: Lavoie, Marie-Lyne, MD;  Location: [facility];  Service: Gynecology;  Laterality: N/A;  request to follow 2nd case around 10am in [place] Gyn block time Requests one hour OR time   HYSTEROSCOPY WITH NOVASURE N/A 09/29/2017   Procedure: HYSTEROSCOPY WITH NOVASURE  ENDOMETRIAL ABLATION;  Surgeon: Lavoie, Marie-Lyne, MD;  Location: [facility];  Service: Gynecology;  Laterality: N/A;   TUBAL LIGATION      Social History   Socioeconomic History   Marital status: Single    Spouse name: Not on file   Number of children: 2   Years of education: two years college   Highest education level: Bachelor's degree (e.g., BA, AB, BS)  Occupational History    Occupation: warehouse  Tobacco Use   Smoking status: Former    Types: Cigarettes   Smokeless tobacco: Never  Vaping Use   Vaping status: Never Used  Substance and Sexual Activity   Alcohol use: Not Currently   Drug use: Never   Sexual activity: Yes    Partners: Male    Birth control/protection: Post-menopausal, Surgical    Comment: BTL  Other Topics Concern   Not on file  Social History Narrative   Lives at home with her daughter.   Right-handed.   Caffeine use: 3 cups per day.   Social Drivers of Health   Financial Resource Strain: Medium Risk (08/29/2023)   Overall Financial Resource Strain (CARDIA)    Difficulty of Paying Living Expenses: Somewhat hard  Food Insecurity: Food Insecurity Present (08/29/2023)   Hunger Vital Sign    Worried About Running Out of Food in the Last Year: Sometimes true    Ran Out of Food in the Last Year: Patient declined  Transportation Needs: No Transportation Needs (08/29/2023)   PRAPARE - Administrator, Civil Service (Medical): No    Lack of Transportation (Non-Medical): No  Physical Activity: Insufficiently Active (08/29/2023)   Exercise Vital Sign    Days of Exercise per Week: 1 day    Minutes of Exercise per Session: 20 min  Stress: No Stress Concern Present (08/29/2023)   [employer] of Occupational Health - Occupational Stress Questionnaire    Feeling of Stress : Only a little  Social Connections: Socially Isolated (08/29/2023)   Social Connection and Isolation Panel [NHANES]    Frequency of Communication with Friends and Family: Three times a week    Frequency of Social Gatherings with Friends and Family: Never    Attends Religious Services: Never    Database Administrator or Organizations: No    Attends Engineer, Structural: Not on file    Marital Status: Never married  Intimate Partner Violence: Unknown (05/27/2023)   Received from [facility]   HITS    Physically Hurt: Not on file    Insult or Talk  Down To: Not on file    Threaten Physical Harm: Not on file    Scream or Curse: Not on file    Family History  Problem Relation Age of Onset   Varicose Veins Mother    Dementia Mother    Parkinson's disease Father    Cancer Maternal Uncle        COLON     Review of Systems  Constitutional: Negative.  Negative for chills and fever.  HENT: Negative.  Negative for congestion and sore throat.   Respiratory: Negative.  Negative for cough and shortness of breath.   Cardiovascular: Negative.  Negative for chest pain and palpitations.  Gastrointestinal:  Negative for abdominal pain, diarrhea, nausea and vomiting.  Skin: Negative.  Negative for rash.  Neurological: Negative.  Negative for dizziness and headaches.  All other systems reviewed and are negative.   Vitals:   08/29/23 1424  BP: 98/64  Pulse:  83  Temp: 98.1 F (36.7 C)  SpO2: 96%    Physical Exam Vitals reviewed.  Constitutional:      Appearance: Normal appearance.  HENT:     Head: Normocephalic.  Eyes:     Extraocular Movements: Extraocular movements intact.  Cardiovascular:     Rate and Rhythm: Normal rate.  Pulmonary:     Effort: Pulmonary effort is normal.  Musculoskeletal:     Comments: Left upper extremity: Positive tenderness to lateral epicondyle and forearm tendon attached to it Normal left shoulder and left wrist No other abnormal findings  Skin:    General: Skin is warm and dry.     Capillary Refill: Capillary refill takes less than 2 seconds.  Neurological:     Mental Status: She is alert and oriented to person, place, and time.  Psychiatric:        Mood and Affect: Mood normal.        Behavior: Behavior normal.      ASSESSMENT & PLAN: A total of 32 minutes was spent with the patient and counseling/coordination of care regarding preparing for this visit, review of most recent office visit notes, diagnosis of left elbow tendinitis and lateral epicondylitis and need for orthopedic evaluation,  pain management, work restrictions, prognosis, documentation, and need for follow-up  Problem List Items Addressed This Visit       Musculoskeletal and Integument   Left elbow tendinitis - Primary   Active and affecting quality of life Work related Pain management discussed Work restrictions discussed.  Work note provided. Recommend meloxicam  15 mg daily for 10 days Recommend orthopedic evaluation Referral placed today      Relevant Medications   meloxicam  (MOBIC ) 15 MG tablet   Other Relevant Orders   Ambulatory referral to Sports Medicine   Left lateral epicondylitis   Very tender on physical exam Pain management discussed Recommend meloxicam  15 mg daily for 10 days Recommend orthopedic evaluation Referral placed today Work restrictions discussed.  Work note provided.      Relevant Medications   meloxicam  (MOBIC ) 15 MG tablet   Other Relevant Orders   Ambulatory referral to Sports Medicine   Patient Instructions  Codo de tenista Tennis Elbow  El codo de tenista es la irritacin e hinchazn (inflamacin) en la zona externa del jacqulyn, cerca del codo. La hinchazn afecta los tejidos que conectan el msculo al hueso (tendones). El codo de tenista puede presentarse al education administrator cualquier deporte o education officer, environmental una tarea que exige el uso excesivo del codo. La causa del codo de tenista es la repeticin del mismo movimiento una y cannon lowing. Cules son las causas? Esta afeccin a menudo se produce por practicar deportes o realizar tareas en los que tiene que mover el antebrazo de la [place]. A veces, la afeccin puede deberse a una lesin repentina. Qu incrementa el riesgo? Tiene ms probabilidades de tener codo de tenista si juega al tenis o practica otro deporte con raqueta. Tambin tiene un mayor riesgo si usa  frecuentemente las manos para printmaker. Esto puede comprender lo siguiente: Usuarios de computadoras. Trabajadores de paramedic. Personas que trabajan en  una fbrica. Msicos. Cocineros. Cajeros. Cules son los signos o sntomas? Dolor y sensibilidad a la palpacin en el antebrazo y la parte externa del codo. Puede sentir dolor todo museum/gallery conservator o solo cuando usa  el brazo. Sensacin de ardor. Esta empieza en el codo y se extiende por el brazo. Agarre dbil en la mano. Cmo se trata? Descansar el brazo y  aplicar hielo es designer, jewellery. El mdico tambin puede recomendarle: Medicamentos para reducir chief technology officer y la hinchazn. Una correa para codo. Fisioterapia. Este tratamiento puede incluir masajes o ejercicios, o ambos. Un dispositivo ortopdico para el codo. Si estos no ayudan a que sus sntomas mejoren, el mdico puede recomendarle una ciruga. Siga estas instrucciones en su casa: Si tiene un dispositivo ortopdico o una correa: Use el dispositivo ortopdico o la correa como se lo haya indicado el mdico. Quteselos solamente como se lo haya indicado el mdico. Controle la piel alrededor del dispositivo ortopdico o la correa todos [place]. Comunquese con su mdico si ve algn problema. Afljeselos si los dedos de la mano: Hormiguean. Se adormecen. Se tornan fros y de color azul. Mantenga la correa o el dispositivo ortopdico limpio. Si el dispositivo ortopdico o la correa no son impermeables: No deje que se mojen. Cbralos con un envoltorio hermtico cuando tome un bao de inmersin o una ducha. Control del dolor, la rigidez y la hinchazn  Aplique hielo sobre la zona lesionada si se lo indican. Para hacer esto: Si tiene un dispositivo ortopdico o una correa desmontable, quteselo como se lo haya indicado el mdico. Ponga el hielo en una bolsa plstica. Coloque una toalla entre la piel y la bolsa. Aplique el hielo durante 20 minutos, 2 o 3 veces por da. Retire el hielo si la piel se le pone de color rojo brillante. Esto es intel. Si no puede sentir dolor, calor o fro, tiene un mayor riesgo de que se dae la  zona. Mueva los dedos con frecuencia. Actividad Descanse el codo y la [place]. Evite las actividades que pueden causar problemas en el codo, como se lo haya indicado el mdico. Haga los ejercicios como se lo haya indicado el mdico. Si levanta un objeto, hgalo con la palma de la mano hacia arriba. Estilo de vida Si el codo de tenista se debe a los deportes, revise el equipo y manufacturing engineer de lo siguiente: Que lo est utilizando de forma correcta. Que es apto para usted. Si el codo de tenista se debe a su trabajo o al uso de una computadora, tmese descansos con frecuencia para estirar el brazo. Hable con su gerente sobre cmo puede hacer que su afeccin mejore en el trabajo. Instrucciones generales [employer] de venta libre y los recetados solamente como se lo haya indicado el mdico. No fume ni consuma ningn producto que contenga nicotina o tabaco. Si necesita ayuda para dejar de consumir estos productos, consulte al mdico. Cumpla con todas las visitas de seguimiento. Cmo se evita? Antes y despus de estar activo: Precaliente y elongue adecuadamente antes de hacer actividad fsica. Reljese y elongue despus de hacer actividad fsica. Dele al cuerpo tiempo para universal health. Mientras est activo: Asegrese de usar el equipo que sea apto para usted. Si juega al tenis, dele potencia a su golpe con la parte inferior del cuerpo. Evite usar nicamente el brazo. Mantenga un estado fsico adecuado. Esto puede comprender lo siguiente: Fuerza. Flexibilidad. Resistencia. Haga ejercicios para fortalecer los msculos del product manager. Comunquese con un mdico si: El dolor no mejora con scientist, research (medical). El dolor [place]. Tiene debilidad en el antebrazo, la mano o los dedos de la [place]. No puede sentir el antebrazo, la mano o los dedos de la [place]. Solicite ayuda de inmediato si: El dolor es muy intenso. No puede mover la [place]. Resumen El codo de tenista es la  irritacin e hinchazn (inflamacin) en  la zona externa del antebrazo, cerca del codo. Su causa es la repeticin del mismo movimiento una y otra vez. Descanse el codo y la [place]. Evite las actividades de acuerdo con lo que le indique su mdico. Si se lo indican, aplique hielo sobre la zona lesionada durante 20 minutos, de 2 a 3 veces por da. Esta informacin no tiene theme park manager el consejo del mdico. Asegrese de hacerle al mdico cualquier pregunta que tenga. Document Revised: 03/16/2020 Document Reviewed: 03/16/2020 Elsevier Patient Education  2024 Elsevier Inc.    Emil Schaumann, MD [facility]

## 2023-08-29 NOTE — Assessment & Plan Note (Signed)
 Active and affecting quality of life Work related Pain management discussed Work restrictions discussed.  Work note provided. Recommend meloxicam 15 mg daily for 10 days Recommend orthopedic evaluation Referral placed today

## 2023-08-29 NOTE — Patient Instructions (Signed)
Codo de [place] Tennis Elbow  El codo de tenista es la irritacin e hinchazn (inflamacin) en la zona externa del Alen Bleacher, cerca del codo. La hinchazn afecta los tejidos que conectan el msculo al hueso (tendones). El codo de [place] puede presentarse al Education administrator cualquier deporte o Education officer, environmental una tarea que exige el uso excesivo del codo. La causa del codo de [place] es la repeticin del mismo movimiento una y Laverda Page. Cules son las causas? Esta afeccin a menudo se produce por practicar deportes o realizar tareas en los que tiene que mover el antebrazo de la [place]. A veces, la afeccin puede deberse a una lesin repentina. Qu incrementa el riesgo? Tiene ms probabilidades de tener codo de tenista si juega al tenis o practica otro deporte con raqueta. Tambin tiene un mayor riesgo si [place] frecuentemente las manos para Printmaker. Esto puede comprender lo siguiente: Usuarios de computadoras. Trabajadores de Paramedic. Personas que trabajan en una fbrica. Msicos. Cocineros. Cajeros. Cules son los signos o sntomas? Dolor y sensibilidad a la palpacin en el Product manager y la parte externa del codo. Puede sentir dolor todo Museum/gallery conservator o solo cuando [place] el brazo. Sensacin de ardor. Esta empieza en el codo y se extiende por el brazo. Agarre dbil en la mano. Cmo se trata? Descansar el brazo y Contractor hielo es Designer, jewellery. El mdico tambin puede recomendarle: Medicamentos para reducir Chief Technology Officer y la hinchazn. Una correa para codo. Fisioterapia. Este tratamiento puede incluir masajes o ejercicios, o ambos. Un dispositivo ortopdico para el codo. Si estos no ayudan a que sus sntomas mejoren, el mdico puede recomendarle [place]. Siga estas instrucciones en su casa: Si tiene un dispositivo ortopdico o una correa: Use el dispositivo ortopdico o la correa como se lo haya indicado el mdico. Quteselos solamente como se lo haya indicado el mdico. Controle la piel  alrededor del dispositivo ortopdico o la correa todos [place]. Comunquese con su mdico si ve algn problema. Afljeselos si los dedos de la mano: Hormiguean. Se adormecen. Se tornan fros y de [employer]. Mantenga la correa o el dispositivo ortopdico limpio. Si el dispositivo ortopdico o la correa no son impermeables: No deje que se mojen. Cbralos con un envoltorio hermtico cuando tome un bao de inmersin o una ducha. Control del dolor, la rigidez y la hinchazn  Aplique hielo sobre la zona lesionada si se lo indican. Para hacer esto: Si tiene un dispositivo ortopdico o una correa desmontable, quteselo como se lo haya indicado el mdico. Ponga el hielo en una bolsa plstica. Coloque una toalla entre la piel y Copy. Aplique el hielo durante 20 minutos, 2 o 3 veces por da. Retire el hielo si la piel se le pone de color rojo brillante. Esto es Intel. Si no puede sentir dolor, calor o fro, tiene un mayor riesgo de que se dae la zona. Mueva los dedos con frecuencia. Actividad Descanse el codo y la [place]. Evite las actividades que pueden causar problemas en el codo, como se lo haya indicado el mdico. Haga los ejercicios como se lo haya indicado el mdico. Si levanta un objeto, hgalo con la palma de la mano [place] arriba. Estilo de vida Si el codo de [place] se debe a los deportes, revise el equipo y Manufacturing engineer de lo siguiente: Que lo est utilizando de forma correcta. Que es apto para usted. Si el codo de [place] se debe a su trabajo o al uso de una computadora, tmese descansos con frecuencia  para Sports coach. Hable con su gerente sobre cmo puede hacer que su afeccin mejore en el trabajo. Instrucciones generales [employer] de venta libre y los recetados solamente como se lo haya indicado el mdico. No fume ni consuma ningn producto que contenga nicotina o tabaco. Si necesita ayuda para dejar de consumir estos productos, consulte al  mdico. Cumpla con todas las visitas de seguimiento. Cmo se evita? Antes y despus de estar activo: Precaliente y elongue adecuadamente antes de hacer actividad fsica. Reljese y elongue despus de hacer actividad fsica. Dele al cuerpo tiempo para [employer]. Mientras est activo: Asegrese de usar el equipo que sea apto para usted. Si juega al tenis, dele potencia a su golpe con la parte inferior del cuerpo. Evite usar Chief Operating Officer. Mantenga un estado fsico adecuado. Esto puede comprender lo siguiente: [place]. Flexibilidad. Resistencia. Haga ejercicios para fortalecer los msculos del Product manager. Comunquese con un mdico si: El dolor no mejora con Scientist, research (medical). El dolor [place]. Tiene debilidad en el antebrazo, la mano o los dedos de la [place]. No puede sentir el antebrazo, la mano o los dedos de la [place]. Solicite ayuda de inmediato si: El dolor es muy intenso. No puede mover la [place]. Resumen El codo de [place] es la irritacin e hinchazn (inflamacin) en la zona externa del Alen Bleacher, cerca del codo. Su causa es la repeticin del mismo movimiento una y Laverda Page. Descanse el codo y la [place]. Evite las actividades de acuerdo con lo que le indique su mdico. Si se lo indican, aplique hielo sobre la zona lesionada durante 20 minutos, de 2 a 3 veces por da. Esta informacin no tiene Theme park manager el consejo del mdico. Asegrese de hacerle al mdico cualquier pregunta que tenga. Document Revised: 03/16/2020 Document Reviewed: 03/16/2020 Elsevier Patient Education  2024 [employer].

## 2023-08-30 NOTE — Progress Notes (Signed)
Aleen Sells D.[facility] [address] [place] [address] Phone: [phone]   Assessment and Plan:     1. Left elbow tendinitis 2. Left lateral epicondylitis -Acute, uncomplicated, initial sports medicine visit - Consistent with lateral epicondylitis versus strain of wrist extensor tendons - Start meloxicam 15 mg daily x2 weeks.  If still having pain after 2 weeks, complete 3rd-week of NSAID. May use remaining NSAID as needed once daily for pain control.  Do not to use additional over-the-counter NSAIDs (ibuprofen, naproxen, Advil, Aleve) while taking prescription NSAIDs.  May use Tylenol [phone] mg 2 to 3 times a day for breakthrough pain.  Refill of meloxicam provided so that patient can complete 2 to 3-week course - Recommend no lifting >20 pounds with left upper extremity.  Work note provided for 3 weeks - May continue to use elbow strap, but recommend additionally using wrist brace to decrease wrist extension.  Recommend wearing brace throughout the day over the next 3 weeks - Start HEP for elbow  15 additional minutes spent for educating Therapeutic Home Exercise Program.  This included exercises focusing on stretching, strengthening, with focus on eccentric aspects.   Long term goals include an improvement in range of motion, strength, endurance as well as avoiding reinjury. Patient's frequency would include in 1-2 times a day, 3-5 times a week for a duration of 6-12 weeks. Proper technique shown and discussed handout in great detail with ATC.  All questions were discussed and answered.    Interpreter was used throughout entirety of office visit   Pertinent previous records reviewed include internal medicine note 08/29/2023  Follow Up: 3 weeks for reevaluation.  Could consider x-ray if no improvement.  Could discuss areas of remaining pain which may include shoulder versus left knee   Subjective:   I, [name], am serving as a  Neurosurgeon for Doctor Richardean Sale  Chief Complaint: left elbow pain   HPI:   08/31/2023 Patient is a 53 year old female with left elbow pain. Patient states she lifted something that was heavy the pain started at her forearm and radiated up tp her shoulder . 2 weeks ago. She started meloxicam 2 days ago and that has helped. Decreased ROM and grip strength, she has to brace her elbow with her body to lift things. States that the pain in her elbow radiates down to her leg but the pain in her leg appeared months ago   Relevant Historical Information: GERD  Additional pertinent review of systems negative.   Current Outpatient Medications:    escitalopram (LEXAPRO) 10 MG tablet, Take 1 tablet (10 mg total) by mouth daily., Disp: 90 tablet, Rfl: 3   meloxicam (MOBIC) 15 MG tablet, Take 1 tablet (15 mg total) by mouth daily for 10 days., Disp: 10 tablet, Rfl: 0   ondansetron (ZOFRAN-ODT) 4 MG disintegrating tablet, Take 1 tablet (4 mg total) by mouth every 8 (eight) hours as needed., Disp: 20 tablet, Rfl: 0   rizatriptan (MAXALT-MLT) 10 MG disintegrating tablet, DISSOLVE ONE TABLET BY MOUTH AS NEEDED; MAY REPEAT IN 2 HOURS IF NEEDED, Disp: 12 tablet, Rfl: 11   tiZANidine (ZANAFLEX) 4 MG tablet, Take 1 tablet (4 mg total) by mouth every 6 (six) hours as needed for muscle spasms., Disp: 30 tablet, Rfl: 11   Objective:     Vitals:   08/31/23 1437  BP: 128/68  Pulse: 78  SpO2: 98%  Weight: 140 lb (63.5 kg)  Height: 5\' 2"  (  1.575 m)      Body mass index is 25.61 kg/m.    Physical Exam:    General: Appears well, no acute distress, nontoxic and pleasant Neck: FROM, no pain Neuro: sensation is intact distally with no deficits, strenghth is 5/5 in elbow flexors/extenders/supinator/pronators and wrist flexors/extensors Psych: no evidence of anxiety or depression  Left elbow: No deformity, swelling or muscle wasting Normal Carrying angle ROM:0-140, supination and pronation 90 TTP lateral  epicondyle, supinator NTTP over triceps, ticeps tendon, olecronon, medial epicondyle, antecubital fossa, biceps tendon,   pronator Negative tinnels over cubital tunnel   pain with resisted wrist and middle digit extension No pain with resisted wrist flexion   pain with resisted supination No pain with resisted pronation Negative valgus stress Negative varus stress Negative milking maneuver    Electronically signed by:  Aleen Sells [facility] 3:14 PM 08/31/23

## 2023-08-31 ENCOUNTER — Ambulatory Visit: Payer: Managed Care, Other (non HMO) | Admitting: Sports Medicine

## 2023-08-31 DIAGNOSIS — M778 Other enthesopathies, not elsewhere classified: Secondary | ICD-10-CM

## 2023-08-31 DIAGNOSIS — M7712 Lateral epicondylitis, left elbow: Secondary | ICD-10-CM | POA: Diagnosis not present

## 2023-08-31 MED ORDER — MELOXICAM 15 MG PO TABS: 15.0000 mg | ORAL_TABLET | Freq: Every day | ORAL | 0 refills | Status: AC

## 2023-08-31 NOTE — Patient Instructions (Addendum)
Meloxicam for additional 10 totally 20 days Continue to use elbow strap Recommend getting a wrist brace  Recommend wearing wrist brace all day  Work note provided no lifting more than 15 pounds with left arm until 09/21/2023 3 week follow up

## 2023-09-05 ENCOUNTER — Telehealth: Payer: Self-pay | Admitting: Emergency Medicine

## 2023-09-05 NOTE — Telephone Encounter (Signed)
Copied from CRM [id number]. Topic: General - Other >> Sep 05, 2023  1:58 PM Sim Boast F wrote: Reason for CRM: Patient called to see if she is able to pick up her fmla paperwork? Please call with update

## 2023-09-05 NOTE — Telephone Encounter (Signed)
LVM for patient that forms will be ready for pick up tomorrow

## 2023-09-06 NOTE — Telephone Encounter (Signed)
Copied from CRM [id number]. Topic: General - Other >> Sep 06, 2023 11:20 AM Isabell A wrote: Reason for CRM: Patient unable to pick up FMLA paperwork, requesting that the office sends it to the company. Patient provided a phone number, states theres no fax. States the phone number will give the option to fax..   [phone]

## 2023-09-06 NOTE — Telephone Encounter (Signed)
Forms has been completed and faxed to HR

## 2023-09-11 ENCOUNTER — Other Ambulatory Visit: Payer: Self-pay | Admitting: Sports Medicine

## 2023-09-11 DIAGNOSIS — M778 Other enthesopathies, not elsewhere classified: Secondary | ICD-10-CM

## 2023-09-11 DIAGNOSIS — M7712 Lateral epicondylitis, left elbow: Secondary | ICD-10-CM

## 2023-09-20 NOTE — Progress Notes (Signed)
 [facility] [address] [place] [address] Phone: [phone]   Assessment and Plan:     1. Left elbow tendinitis (Primary) 2. Left lateral epicondylitis -Subacute, mild improvement, subsequent visit - Still consistent with lateral epicondylitis with overall improvement using meloxicam  course, the patient continues to have daily pain with activity and lifting.  New mild pains in left thumb and the left shoulder likely due to compensation. - Discontinue meloxicam .  May use remainder medication as needed for breakthrough pain.  Recommend limiting chronic NSAIDs, 2 doses per week - Continue HEP - Start prednisone Dosepak - Start physical therapy.  Referral sent - Recommend no lifting >15 pounds with left upper extremity for the next 3 weeks or until reevaluated. - Recommend continuing to use wrist brace when physically active and at night to decrease strain    Pertinent previous records reviewed include none  Follow Up: 3 weeks for reevaluation.  If no improvement or worsening of symptoms, would consider needling versus discussing ECSWT versus advanced imaging   Subjective:   I, Desiree Krueger, am serving as a neurosurgeon for Doctor Desiree Krueger  Chief Complaint: left elbow pain    HPI:    08/31/2023 Patient is a 53 year old female with left elbow pain. Patient states she lifted something that was heavy the pain started at her forearm and radiated up tp her shoulder . 2 weeks ago. She started meloxicam  2 days ago and that has helped. Decreased ROM and grip strength, she has to brace her elbow with her body to lift things. States that the pain in her elbow radiates down to her leg but the pain in her leg appeared months ago   09/21/2023 Patient states it hurts to put strain on it. It is getting better. When she is inactive there is no pain. When she does activity it hurts. There is a stabbing pain that radiates up her shoulder  from her elbow.    Relevant Historical Information: GERD Additional pertinent review of systems negative.   Current Outpatient Medications:    escitalopram  (LEXAPRO ) 10 MG tablet, Take 1 tablet (10 mg total) by mouth daily., Disp: 90 tablet, Rfl: 3   ondansetron  (ZOFRAN -ODT) 4 MG disintegrating tablet, Take 1 tablet (4 mg total) by mouth every 8 (eight) hours as needed., Disp: 20 tablet, Rfl: 0   rizatriptan  (MAXALT -MLT) 10 MG disintegrating tablet, DISSOLVE ONE TABLET BY MOUTH AS NEEDED; MAY REPEAT IN 2 HOURS IF NEEDED, Disp: 12 tablet, Rfl: 11   tiZANidine  (ZANAFLEX ) 4 MG tablet, Take 1 tablet (4 mg total) by mouth every 6 (six) hours as needed for muscle spasms., Disp: 30 tablet, Rfl: 11   Objective:     Vitals:   09/21/23 1448  BP: 104/60  Pulse: 75  SpO2: 98%  Weight: 144 lb (65.3 kg)  Height: 5' 2 (1.575 m)      Body mass index is 26.34 kg/m.    Physical Exam:      General: Appears well, no acute distress, nontoxic and pleasant Neck: FROM, no pain Neuro: sensation is intact distally with no deficits, strenghth is 5/5 in elbow flexors/extenders/supinator/pronators and wrist flexors/extensors Psych: no evidence of anxiety or depression   Left elbow: No deformity, swelling or muscle wasting Normal Carrying angle ROM:0-140, supination and pronation 90 TTP lateral epicondyle, supinator NTTP over triceps, ticeps tendon, olecronon, medial epicondyle, antecubital fossa, biceps tendon,   pronator Negative tinnels over cubital tunnel   pain with  resisted wrist and middle digit extension No pain with resisted wrist flexion   pain with resisted supination No pain with resisted pronation Negative valgus stress Negative varus stress Negative milking maneuver     Electronically signed by:  [facility] 3:06 PM 09/21/23

## 2023-09-21 ENCOUNTER — Ambulatory Visit: Payer: Managed Care, Other (non HMO) | Admitting: Sports Medicine

## 2023-09-21 VITALS — BP 104/60 | HR 75 | Ht 62.0 in | Wt 144.0 lb

## 2023-09-21 DIAGNOSIS — M7712 Lateral epicondylitis, left elbow: Secondary | ICD-10-CM | POA: Diagnosis not present

## 2023-09-21 DIAGNOSIS — M778 Other enthesopathies, not elsewhere classified: Secondary | ICD-10-CM

## 2023-09-21 MED ORDER — METHYLPREDNISOLONE 4 MG PO TBPK: ORAL_TABLET | ORAL | 0 refills | Status: DC

## 2023-09-21 NOTE — Patient Instructions (Signed)
 Discontinue meloxicam  Prednisone dos pak Use wrist brace at night and when physically active  Continue HEP  Work note no lifting more than 15 pounds for the next 3 weeks  PT referral  3 week follow up

## 2023-10-11 NOTE — Progress Notes (Unsigned)
    Desiree Krueger D.[facility] [address] [place] Kr[place] Phone: [phone]   Assessment and Plan:     There are no diagnoses linked to this encounter.  ***   Pertinent previous records reviewed include ***    Follow Up: ***     Subjective:   I, Desiree Krueger, am serving as a Neurosurgeon for Desiree Krueger  Chief Complaint: left elbow pain    HPI:    08/31/2023 Patient is a 52 year old female with left elbow pain. Patient states she lifted something that was heavy the pain started at her forearm and radiated up tp her shoulder . 2 weeks ago. She started meloxicam 2 days ago and that has helped. Decreased ROM and grip strength, she has to brace her elbow with her body to lift things. States that the pain in her elbow radiates down to her leg but the pain in her leg appeared months ago    09/21/2023 Patient states it hurts to put strain on it. It is getting better. When she is inactive there is no pain. When she does activity it hurts. There is a stabbing pain that radiates up her shoulder from her elbow.   10/12/2023 Patient states   Relevant Historical Information: GERD  Additional pertinent review of systems negative.   Current Outpatient Medications:    escitalopram (LEXAPRO) 10 MG tablet, Take 1 tablet (10 mg total) by mouth daily., Disp: 90 tablet, Rfl: 3   methylPREDNISolone (MEDROL DOSEPAK) 4 MG TBPK tablet, Take 6 tablets on day 1.  Take 5 tablets on day 2.  Take 4 tablets on day 3.  Take 3 tablets on day 4.  Take 2 tablets on day 5.  Take 1 tablet on day 6., Disp: 21 tablet, Rfl: 0   ondansetron (ZOFRAN-ODT) 4 MG disintegrating tablet, Take 1 tablet (4 mg total) by mouth every 8 (eight) hours as needed., Disp: 20 tablet, Rfl: 0   rizatriptan (MAXALT-MLT) 10 MG disintegrating tablet, DISSOLVE ONE TABLET BY MOUTH AS NEEDED; MAY REPEAT IN 2 HOURS IF NEEDED, Disp: 12 tablet, Rfl: 11   tiZANidine (ZANAFLEX) 4 MG tablet, Take  1 tablet (4 mg total) by mouth every 6 (six) hours as needed for muscle spasms., Disp: 30 tablet, Rfl: 11   Objective:     There were no vitals filed for this visit.    There is no height or weight on file to calculate BMI.    Physical Exam:    ***   Electronically signed by:  Desiree Krueger D.[facility] 7:32 AM 10/11/23

## 2023-10-12 ENCOUNTER — Ambulatory Visit (INDEPENDENT_AMBULATORY_CARE_PROVIDER_SITE_OTHER): Payer: Managed Care, Other (non HMO) | Admitting: Sports Medicine

## 2023-10-12 VITALS — BP 120/80 | HR 97 | Ht 62.0 in | Wt 140.0 lb

## 2023-10-12 DIAGNOSIS — M778 Other enthesopathies, not elsewhere classified: Secondary | ICD-10-CM | POA: Diagnosis not present

## 2023-10-12 DIAGNOSIS — M7712 Lateral epicondylitis, left elbow: Secondary | ICD-10-CM | POA: Diagnosis not present

## 2023-10-12 NOTE — Patient Instructions (Signed)
 Work note provided no lifting more than 15 pounds with left arm for 6 weeks Continue tylenol  Start Voltaren gel over areas of pain  Start PT  5-6 week follow up

## 2023-10-24 ENCOUNTER — Ambulatory Visit: Payer: Managed Care, Other (non HMO) | Attending: Sports Medicine | Admitting: Physical Therapy

## 2023-10-24 ENCOUNTER — Encounter: Payer: Self-pay | Admitting: Physical Therapy

## 2023-10-24 DIAGNOSIS — M778 Other enthesopathies, not elsewhere classified: Secondary | ICD-10-CM | POA: Diagnosis not present

## 2023-10-24 DIAGNOSIS — M25522 Pain in left elbow: Secondary | ICD-10-CM | POA: Diagnosis present

## 2023-10-24 DIAGNOSIS — M7712 Lateral epicondylitis, left elbow: Secondary | ICD-10-CM | POA: Insufficient documentation

## 2023-10-24 NOTE — Therapy (Signed)
 OUTPATIENT PHYSICAL THERAPY UPPER EXTREMITY EVALUATION   Patient Name: Taleisha Kaczynski MRN: [id number] DOB:[birth year], 53 y.o., female Today's Date: 10/24/2023  END OF SESSION:  PT End of Session - 10/24/23 1407     Visit Number 1    Date for PT Re-Evaluation 01/24/24    Authorization Type [employer]    PT Start Time 1405    PT Stop Time 1445    PT Time Calculation (min) 40 min    Activity Tolerance Patient tolerated treatment well    Behavior During Therapy WFL for tasks assessed/performed             Past Medical History:  Diagnosis Date   Anemia    Constipation    Depression    Dysrhythmia    GERD (gastroesophageal reflux disease)    Headache    MIGRAINES   Low blood pressure    Lower back pain    Thyroid disease    Varicose vein of leg    BILATERAL   Past Surgical History:  Procedure Laterality Date   DILATATION & CURETTAGE/HYSTEROSCOPY WITH MYOSURE N/A 01/31/2017   Procedure: DILATATION & CURETTAGE/HYSTEROSCOPY WITH MYOSURE;  Surgeon: Ok Edwards, MD;  Location: [facility];  Service: Gynecology;  Laterality: N/A;   DILATATION & CURETTAGE/HYSTEROSCOPY WITH MYOSURE N/A 09/29/2017   Procedure: DILATATION & CURETTAGE/HYSTEROSCOPY WITH MYOSURE RESECTION OF SUBMUCOSAL MYOMA;  Surgeon: Genia Del, MD;  Location[facility];  Service: Gynecology;  Laterality: N/A;  request to follow 2nd case around 10am in [place] Gyn block time Requests one hour OR time   HYSTEROSCOPY WITH NOVASURE N/A 09/29/2017   Procedure: HYSTEROSCOPY WITH NOVASURE ENDOMETRIAL ABLATION;  Surgeon: Genia Del, MD;  Location: [facility];  Service: Gynecology;  Laterality: N/A;   TUBAL LIGATION     Patient Active Problem List   Diagnosis Date Noted   Left elbow tendinitis 08/29/2023   Left lateral epicondylitis 08/29/2023   E. coli UTI 07/03/2023   Recent urinary tract infection 06/13/2023   Pyelonephritis 05/31/2023   Dysthymia 07/05/2022    Chronic idiopathic constipation 06/23/2022   Intractable chronic migraine without aura and without status migrainosus 08/18/2021   Postmenopausal 08/18/2021   Chronic bilateral low back pain without sciatica 02/10/2021   Chronic migraine w/o aura w/o status migrainosus, not intractable 12/24/2020   Headache, new daily persistent ([facility]) 12/24/2020   Iron deficiency 08/24/2017   Elevated liver function tests 03/16/2017   Gastroesophageal reflux disease without esophagitis 01/11/2017   High risk medication use 01/11/2017   History of hyperthyroidism 01/11/2017   Hyperthyroidism 01/11/2017   Anemia due to chronic blood loss 03/16/2016   Submucous leiomyoma of uterus 03/16/2016    PCP: Alvy Bimler, MD  REFERRING PROVIDER: Chrisandra Netters, MD  REFERRING DIAG: left lateral epicondylitis  THERAPY DIAG:  Pain in left elbow  Rationale for Evaluation and Treatment: Rehabilitation  ONSET DATE: 08/29/23  SUBJECTIVE:  SUBJECTIVE STATEMENT: Reports that at work she lifted something heavy and felt pain, August 17, 2023.  She reports she is a little better, she has taken meloxicam, ibuprofen and prednisone Hand dominance: Right  PERTINENT HISTORY: Reports a fall a few years ago and hurt shoulders  PAIN:  Are you having pain? Yes: NPRS scale: 2 Pain location: left forearm and lateral epicondyle, some pain in the left lateral upper arm to the shoulder and necl Pain description: shooting, sharp, ache Aggravating factors: twisting hand and arm, lifting, 10/10 at times Relieving factors: rest, ibuprofen pain can be 0/10  PRECAUTIONS: None  RED FLAGS: None   WEIGHT BEARING RESTRICTIONS: No  FALLS:  Has patient fallen in last 6 months? No  LIVING ENVIRONMENT: Lives with: lives with their family Lives in:  House/apartment Stairs: No Has following equipment at home: None  OCCUPATION: [employer] , lifting, pushing  and pulling lifting up to 60#  PLOF: Independent and 34 month old child she lifts at times, housework  PATIENT GOALS: do my job , have no pain  NEXT MD VISIT: 3 months back to Dr. Jean Rosenthal  OBJECTIVE:  Note: Objective measures were completed at Evaluation unless otherwise noted.  DIAGNOSTIC FINDINGS:  none  PATIENT SURVEYS :  Quick Dash 77%  COGNITION: Overall cognitive status: Within functional limits for tasks assessed     SENSATION: WFL  POSTURE: Rounded shoulder, forward head  UPPER EXTREMITY ROM:   Active ROM Right eval Left eval  Shoulder flexion  160  Shoulder extension    Shoulder abduction  160  Shoulder adduction    Shoulder internal rotation  50  Shoulder external rotation  70  Elbow flexion  140  Elbow extension  10  Wrist flexion  60  Wrist extension  60  Wrist ulnar deviation    Wrist radial deviation    Wrist pronation  60  Wrist supination  60  (Blank rows = not tested)  UPPER EXTREMITY MMT:  MMT Right eval Left eval  Shoulder flexion  4  Shoulder extension    Shoulder abduction    Shoulder adduction    Shoulder internal rotation    Shoulder external rotation    Middle trapezius    Lower trapezius    Elbow flexion  4  Elbow extension  4-  Wrist flexion  4  Wrist extension  4-  Wrist ulnar deviation    Wrist radial deviation    Wrist pronation  4  Wrist supination  4-  Grip strength (lbs) 60# 40#  (Blank rows = not tested)   JOINT MOBILITY TESTING:  Tender left elbow  PALPATION:  Very tender in the wrist extensors, very tender left lateral epicondyle, tender left lateral upper arm                                                                                                                             TREATMENT DATE:  10/24/23 Evaluation  PATIENT EDUCATION: Education details: HEP/POC Person  educated: Patient Education method: Programmer, multimedia, Demonstration, Actor cues, Verbal cues, and Handouts Education comprehension: verbalized understanding  HOME EXERCISE PROGRAM: Access Code: [email or web address] URL: [email or web address] Date: 10/24/2023 Prepared by: Stacie Glaze  Exercises - Seated Wrist Extension Stretch  - 2 x daily - 7 x weekly - 1 sets - 5 reps - 15 hold - Seated Wrist Flexion Stretch  - 2 x daily - 7 x weekly - 1 sets - 5 reps - 15 hold  ASSESSMENT:  CLINICAL IMPRESSION: Patient is a 53 y.o. female who was seen today for physical therapy evaluation and treatment for left lateral epicondylitis. Left elbow is very tender in the extensor mms and over the lateral epicondyle.  She is right handed, her job requires lifting, pushing and pulling up to 60#.  She reports that she has stopped doing a lot of stuff at home due to pain.   OBJECTIVE IMPAIRMENTS: decreased coordination, decreased endurance, decreased ROM, decreased strength, increased edema, increased muscle spasms, impaired flexibility, impaired UE functional use, improper body mechanics, postural dysfunction, and pain.   REHAB POTENTIAL: Good  CLINICAL DECISION MAKING: Evolving/moderate complexity  EVALUATION COMPLEXITY: Low  GOALS: Goals reviewed with patient? Yes  SHORT TERM GOALS: Target date: 11/24/23  Independent with initial HEP Baseline: Goal status: INITIAL  LONG TERM GOALS: Target date: 01/24/24  Independent with advanced/final HEP Baseline:  Goal status: INITIAL  2.  Understand posture and body mechanics for elbow Baseline:  Goal status: INITIAL  3.  Decrease pain 50% Baseline:  Goal status: INITIAL  4.  Increase left wrist flexion and extension to 75 degrees Baseline:  Goal status: INITIAL  5.  Increase left grip strength to 50# Baseline:  Goal status: INITIAL  PLAN: PT FREQUENCY: 1-2x/week  PT DURATION: 12 weeks  PLANNED INTERVENTIONS: 97164- PT Re-evaluation,  97110-Therapeutic exercises, 97530- Therapeutic activity, [id number]- Neuromuscular re-education, 97535- Self Care, 16109- Manual therapy, G0283- Electrical stimulation (unattended), 97035- Ultrasound, 60454- Ionotophoresis 4mg /ml Dexamethasone, Patient/Family education, Taping, Dry Needling, Joint mobilization, Cryotherapy, and Moist heat  PLAN FOR NEXT SESSION: STM. Stretches, could try ionto and dry needling   Dyann Goodspeed W, PT 10/24/2023, 2:09 PM

## 2023-10-24 NOTE — Patient Instructions (Signed)

## 2023-10-27 ENCOUNTER — Ambulatory Visit (INDEPENDENT_AMBULATORY_CARE_PROVIDER_SITE_OTHER): Admitting: Family Medicine

## 2023-10-27 ENCOUNTER — Encounter: Payer: Self-pay | Admitting: Family Medicine

## 2023-10-27 VITALS — BP 100/70 | HR 81 | Temp 98.7°F | Ht 62.0 in | Wt 141.8 lb

## 2023-10-27 DIAGNOSIS — J029 Acute pharyngitis, unspecified: Secondary | ICD-10-CM | POA: Diagnosis not present

## 2023-10-27 DIAGNOSIS — K146 Glossodynia: Secondary | ICD-10-CM

## 2023-10-27 LAB — POCT RAPID STREP A (OFFICE): Rapid Strep A Screen: NEGATIVE

## 2023-10-27 MED ORDER — NYSTATIN 100000 UNIT/ML MT SUSP: 5.0000 mL | Freq: Four times a day (QID) | OROMUCOSAL | 0 refills | Status: DC

## 2023-10-27 NOTE — Patient Instructions (Signed)
 Magic mouthwash, may swish, gargle and spit up to 4 times per day as needed for mouth pain.  Follow-up with me for new or worsening symptoms.

## 2023-10-27 NOTE — Progress Notes (Signed)
 Acute Office Visit  Subjective:     Patient ID: Desiree Krueger, female    DOB: Jul 27, 1971, 53 y.o.   MRN: [id number]  Chief Complaint  Patient presents with   Acute Visit    Ongoing for 2 weeks, painful swallowing, brushing teeth, talk, etc, tongue and neck pains as well    HPI Patient is in today for evaluation of sore throat, tongue, lymphadenopathy. In person Spanish interpreter present for visit.  Pain has been present for 2 weeks.  Reports cough that is worse at night, dry cough. Has used peroxide to blisters on her lips. Does report that granddaughter has also been sick with congestion and cough.  Reports that early in her illness, she also had lots of nasal congestion. Denies abdominal pain, nausea, vomiting, diarrhea, rash, fever, chills, other symptoms. Medical history as outlined below.  ROS Per HPI      Objective:    BP 100/70 (BP Location: Left Arm, Patient Position: Sitting)   Pulse 81   Temp 98.7 F (37.1 C) (Temporal)   Ht 5\' 2"  (1.575 m)   Wt 141 lb 12.8 oz (64.3 kg)   LMP 09/18/2017 (Exact Date) Comment: BTL  SpO2 97%   BMI 25.94 kg/m    Physical Exam Vitals and nursing note reviewed.  Constitutional:      General: She is not in acute distress.    Appearance: Normal appearance. She is normal weight.  HENT:     Head: Normocephalic and atraumatic.     Nose: Nose normal.     Mouth/Throat:     Tongue: Lesions present.     Pharynx: Posterior oropharyngeal erythema present.      Comments: Vesicular lesions noted to the back of the throat and some on the tongue, no discharge, no bleeding Eyes:     Extraocular Movements: Extraocular movements intact.     Pupils: Pupils are equal, round, and reactive to light.  Cardiovascular:     Rate and Rhythm: Normal rate and regular rhythm.     Heart sounds: Normal heart sounds.  Pulmonary:     Effort: Pulmonary effort is normal. No respiratory distress.     Breath sounds: Normal breath sounds. No  wheezing, rhonchi or rales.  Musculoskeletal:        General: Normal range of motion.     Cervical back: Normal range of motion.  Lymphadenopathy:     Cervical: Cervical adenopathy present.  Neurological:     General: No focal deficit present.     Mental Status: She is alert and oriented to person, place, and time.  Psychiatric:        Mood and Affect: Mood normal.        Thought Content: Thought content normal.    No results found for any visits on 10/27/23.      Assessment & Plan:   Sore throat -     POCT rapid strep A -     magic mouthwash (nystatin, lidocaine, diphenhydrAMINE, alum & mag hydroxide) suspension; Swish and spit 5 mLs 4 (four) times daily.  Dispense: 180 mL; Refill: 0  Tongue pain -     magic mouthwash (nystatin, lidocaine, diphenhydrAMINE, alum & mag hydroxide) suspension; Swish and spit 5 mLs 4 (four) times daily.  Dispense: 180 mL; Refill: 0  Possibly adenovirus, Magic mouthwash to pharmacy Negative strep  Meds ordered this encounter  Medications   magic mouthwash (nystatin, lidocaine, diphenhydrAMINE, alum & mag hydroxide) suspension    Sig: Swish and  spit 5 mLs 4 (four) times daily.    Dispense:  180 mL    Refill:  0    Return if symptoms worsen or fail to improve.  Moshe Cipro, FNP

## 2023-10-30 ENCOUNTER — Telehealth: Admitting: Emergency Medicine

## 2023-11-02 ENCOUNTER — Ambulatory Visit: Admitting: Physical Therapy

## 2023-11-02 ENCOUNTER — Encounter: Payer: Self-pay | Admitting: Physical Therapy

## 2023-11-02 DIAGNOSIS — M25522 Pain in left elbow: Secondary | ICD-10-CM | POA: Diagnosis not present

## 2023-11-02 NOTE — Therapy (Signed)
 OUTPATIENT PHYSICAL THERAPY UPPER EXTREMITY TREATMENT   Patient Name: Desiree Krueger MRN: [id number] DOB:1971-07-12, 53 y.o., female Today's Date: 11/02/2023  END OF SESSION:  PT End of Session - 11/02/23 1402     Visit Number 2    Date for PT Re-Evaluation 01/24/24    Authorization Type [employer]    PT Start Time 1400    PT Stop Time 1445    PT Time Calculation (min) 45 min    Activity Tolerance Patient tolerated treatment well    Behavior During Therapy WFL for tasks assessed/performed             Past Medical History:  Diagnosis Date   Anemia    Constipation    Depression    Dysrhythmia    GERD (gastroesophageal reflux disease)    Headache    MIGRAINES   Low blood pressure    Lower back pain    Thyroid disease    Varicose vein of leg    BILATERAL   Past Surgical History:  Procedure Laterality Date   DILATATION & CURETTAGE/HYSTEROSCOPY WITH MYOSURE N/A 01/31/2017   Procedure: DILATATION & CURETTAGE/HYSTEROSCOPY WITH MYOSURE;  Surgeon: Ok Edwards, MD;  Location: [facility];  Service: Gynecology;  Laterality: N/A;   DILATATION & CURETTAGE/HYSTEROSCOPY WITH MYOSURE N/A 09/29/2017   Procedure: DILATATION & CURETTAGE/HYSTEROSCOPY WITH MYOSURE RESECTION OF SUBMUCOSAL MYOMA;  Surgeon: Genia Del, MD;  Location: [facility];  Service: Gynecology;  Laterality: N/A;  request to follow 2nd case around 10am in [place] Gyn block time Requests one hour OR time   HYSTEROSCOPY WITH NOVASURE N/A 09/29/2017   Procedure: HYSTEROSCOPY WITH NOVASURE ENDOMETRIAL ABLATION;  Surgeon: Genia Del, MD;  Location: [facility];  Service: Gynecology;  Laterality: N/A;   TUBAL LIGATION     Patient Active Problem List   Diagnosis Date Noted   Left elbow tendinitis 08/29/2023   Left lateral epicondylitis 08/29/2023   E. coli UTI 07/03/2023   Recent urinary tract infection 06/13/2023   Pyelonephritis 05/31/2023   Dysthymia 07/05/2022    Chronic idiopathic constipation 06/23/2022   Intractable chronic migraine without aura and without status migrainosus 08/18/2021   Postmenopausal 08/18/2021   Chronic bilateral low back pain without sciatica 02/10/2021   Chronic migraine w/o aura w/o status migrainosus, not intractable 12/24/2020   Headache, new daily persistent ([facility]PH) 12/24/2020   Iron deficiency 08/24/2017   Elevated liver function tests 03/16/2017   Gastroesophageal reflux disease without esophagitis 01/11/2017   High risk medication use 01/11/2017   History of hyperthyroidism 01/11/2017   Hyperthyroidism 01/11/2017   Anemia due to chronic blood loss 03/16/2016   Submucous leiomyoma of uterus 03/16/2016    PCP: Alvy Bimler, MD  REFERRING PROVIDER: Chrisandra Netters, MD  REFERRING DIAG: left lateral epicondylitis  THERAPY DIAG:  Pain in left elbow  Rationale for Evaluation and Treatment: Rehabilitation  ONSET DATE: 08/29/23  SUBJECTIVE:  SUBJECTIVE STATEMENT: Reports that she has tried the stretches, just got off work and is sore in both elbows  Reports that at work she lifted something heavy and felt pain, August 17, 2023.  She reports she is a little better, she has taken meloxicam, ibuprofen and prednisone Hand dominance: Right  PERTINENT HISTORY: Reports a fall a few years ago and hurt shoulders  PAIN:  Are you having pain? Yes: NPRS scale: 5/10 Pain location: left forearm and lateral epicondyle, some pain in the left lateral upper arm to the shoulder and necl Pain description: shooting, sharp, ache Aggravating factors: twisting hand and arm, lifting, 10/10 at times Relieving factors: rest, ibuprofen pain can be 0/10  PRECAUTIONS: None  RED FLAGS: None   WEIGHT BEARING RESTRICTIONS: No  FALLS:  Has patient fallen in last 6  months? No  LIVING ENVIRONMENT: Lives with: lives with their family Lives in: House/apartment Stairs: No Has following equipment at home: None  OCCUPATION: [employer] , lifting, pushing  and pulling lifting up to 60#  PLOF: Independent and 41 month old child she lifts at times, housework  PATIENT GOALS: do my job , have no pain  NEXT MD VISIT: 3 months back to Dr. Jean Rosenthal  OBJECTIVE:  Note: Objective measures were completed at Evaluation unless otherwise noted.  DIAGNOSTIC FINDINGS:  none  PATIENT SURVEYS :  Quick Dash 77%  COGNITION: Overall cognitive status: Within functional limits for tasks assessed     SENSATION: WFL  POSTURE: Rounded shoulder, forward head  UPPER EXTREMITY ROM:   Active ROM Right eval Left eval  Shoulder flexion  160  Shoulder extension    Shoulder abduction  160  Shoulder adduction    Shoulder internal rotation  50  Shoulder external rotation  70  Elbow flexion  140  Elbow extension  10  Wrist flexion  60  Wrist extension  60  Wrist ulnar deviation    Wrist radial deviation    Wrist pronation  60  Wrist supination  60  (Blank rows = not tested)  UPPER EXTREMITY MMT:  MMT Right eval Left eval  Shoulder flexion  4  Shoulder extension    Shoulder abduction    Shoulder adduction    Shoulder internal rotation    Shoulder external rotation    Middle trapezius    Lower trapezius    Elbow flexion  4  Elbow extension  4-  Wrist flexion  4  Wrist extension  4-  Wrist ulnar deviation    Wrist radial deviation    Wrist pronation  4  Wrist supination  4-  Grip strength (lbs) 60# 40#  (Blank rows = not tested)   JOINT MOBILITY TESTING:  Tender left elbow  PALPATION:  Very tender in the wrist extensors, very tender left lateral epicondyle, tender left lateral upper arm  TREATMENT DATE:   11/02/23 Reviewed and performed HEP Trigger Point Dry Needling  Initial Treatment: Pt instructed on Dry Needling rational, procedures, and possible side effects. Pt instructed to expect mild to moderate muscle soreness later in the day and/or into the next day.  Pt instructed in methods to reduce muscle soreness. Pt instructed to continue prescribed HEP. Patient verbalized understanding of these instructions and education.   Patient Verbal Consent Given: Yes Education Handout Provided: Yes Muscles Treated: left wrist extensors Treatment Response/Outcome: LTR STM and passive stretches to the elbows Ionto 80mA dose to the left lateral epicondyle    10/24/23 Evaluation   PATIENT EDUCATION: Education details: HEP/POC Person educated: Patient Education method: Programmer, multimedia, Demonstration, Actor cues, Verbal cues, and Handouts Education comprehension: verbalized understanding  HOME EXERCISE PROGRAM: Access Code: [email or web address] URL: [email or web address] Date: 10/24/2023 Prepared by: Stacie Glaze  Exercises - Seated Wrist Extension Stretch  - 2 x daily - 7 x weekly - 1 sets - 5 reps - 15 hold - Seated Wrist Flexion Stretch  - 2 x daily - 7 x weekly - 1 sets - 5 reps - 15 hold  ASSESSMENT:  CLINICAL IMPRESSION: Patient worked today feeling a little more soreness in the elbow and some in the right elbow, she does reports that she is trying to do lighter things, we again went over body mechanics regarding neutral grip or palm up grip  Patient is a 53 y.o. female who was seen today for physical therapy evaluation and treatment for left lateral epicondylitis. Left elbow is very tender in the extensor mms and over the lateral epicondyle.  She is right handed, her job requires lifting, pushing and pulling up to 60#.  She reports that she has stopped doing a lot of stuff at home due to pain.   OBJECTIVE IMPAIRMENTS: decreased coordination, decreased endurance, decreased  ROM, decreased strength, increased edema, increased muscle spasms, impaired flexibility, impaired UE functional use, improper body mechanics, postural dysfunction, and pain.   REHAB POTENTIAL: Good  CLINICAL DECISION MAKING: Evolving/moderate complexity  EVALUATION COMPLEXITY: Low  GOALS: Goals reviewed with patient? Yes  SHORT TERM GOALS: Target date: 11/24/23  Independent with initial HEP Baseline: Goal status: met 11/02/23  LONG TERM GOALS: Target date: 01/24/24  Independent with advanced/final HEP Baseline:  Goal status: INITIAL  2.  Understand posture and body mechanics for elbow Baseline:  Goal status: INITIAL  3.  Decrease pain 50% Baseline:  Goal status: INITIAL  4.  Increase left wrist flexion and extension to 75 degrees Baseline:  Goal status: INITIAL  5.  Increase left grip strength to 50# Baseline:  Goal status: INITIAL  PLAN: PT FREQUENCY: 1-2x/week  PT DURATION: 12 weeks  PLANNED INTERVENTIONS: 97164- PT Re-evaluation, 97110-Therapeutic exercises, 97530- Therapeutic activity, [id number]- Neuromuscular re-education, 97535- Self Care, 95621- Manual therapy, G0283- Electrical stimulation (unattended), 97035- Ultrasound, 30865- Ionotophoresis 4mg /ml Dexamethasone, Patient/Family education, Taping, Dry Needling, Joint mobilization, Cryotherapy, and Moist heat  PLAN FOR NEXT SESSION: STM. Stretches, could try ionto and dry needling   Demiana Crumbley W, PT 11/02/2023, 2:03 PM

## 2023-11-02 NOTE — Patient Instructions (Signed)
 Trigger Point Dry Needling  What is Trigger Point Dry Needling (DN)? DN is a physical therapy technique used to treat muscle pain and dysfunction. Specifically, DN helps deactivate muscle trigger points (muscle knots).  A thin filiform needle is used to penetrate the skin and stimulate the underlying trigger point. The goal is for a local twitch response (LTR) to occur and for the trigger point to relax. No medication of any kind is injected during the procedure.   What Does Trigger Point Dry Needling Feel Like?  The procedure feels different for each individual patient. Some patients report that they do not actually feel the needle enter the skin and overall the process is not painful. Very mild bleeding may occur. However, many patients feel a deep cramping in the muscle in which the needle was inserted. This is the local twitch response.   How Will I feel after the treatment? Soreness is normal, and the onset of soreness may not occur for a few hours. Typically this soreness does not last longer than two days.  Bruising is uncommon, however; ice can be used to decrease any possible bruising.  In rare cases feeling tired or nauseous after the treatment is normal. In addition, your symptoms may get worse before they get better, this period will typically not last longer than 24 hours.   What Can I do After My Treatment? Increase your hydration by drinking more water for the next 24 hours.  You may place ice or heat on the areas treated that have become sore, however, do not use heat on inflamed or bruised areas. Heat often brings more relief post needling. You can continue your regular activities, but vigorous activity is not recommended initially after the treatment for 24 hours. DN is best combined with other physical therapy such as strengthening, stretching, and other therapies.   What are the complications? While your therapist has had extensive training in minimizing the risks of trigger  point dry needling, it is important to understand the risks of any procedure.  Risks include bleeding, pain, fatigue, hematoma, infection, vertigo, nausea or nerve involvement. Monitor for any changes to your skin or sensation. Contact your therapist or MD with concerns.  A rare but serious complication is a pneumothorax over or near your middle and upper chest and back If you have dry needling in this area, monitor for the following symptoms: Shortness of breath on exertion and/or Difficulty taking a deep breath and/or Chest Pain and/or A dry cough If any of the above symptoms develop, please go to the nearest emergency room or call 911. Tell them you had dry needling over your thorax and report any symptoms you are having. Please follow-up with your treating therapist after you complete the medical evaluation.   IONTOPHORESIS PATIENT PRECAUTIONS & CONTRAINDICATIONS:  Redness under one or both electrodes can occur.  This characterized by a uniform redness that usually disappears within 12 hours of treatment. Small pinhead size blisters may result in response to the drug.  Contact your physician if the problem persists more than 24 hours. On rare occasions, iontophoresis therapy can result in temporary skin reactions such as rash, inflammation, irritation or burns.  The skin reactions may be the result of individual sensitivity to the ionic solution used, the condition of the skin at the start of treatment, reaction to the materials in the electrodes, allergies or sensitivity to dexamethasone, or a poor connection between the patch and your skin.  Discontinue using iontophoresis if you have any  of these reactions and report to your therapist. Remove the Patch or electrodes if you have any undue sensation of pain or burning during the treatment and report discomfort to your therapist. Tell your Therapist if you have had known adverse reactions to the application of electrical current. If using the  Patch, the LED light will turn off when treatment is complete and the patch can be removed.  Approximate treatment time is 1-3 hours.  Remove the patch when light goes off or after 6 hours. The Patch can be worn during normal activity, however excessive motion where the electrodes have been placed can cause poor contact between the skin and the electrode or uneven electrical current resulting in greater risk of skin irritation. Keep out of the reach of children.   DO NOT use if you have a cardiac pacemaker or any other electrically sensitive implanted device. DO NOT use if you have a known sensitivity to dexamethasone. DO NOT use during Magnetic Resonance Imaging (MRI). DO NOT use over broken or compromised skin (e.g. sunburn, cuts, or acne) due to the increased risk of skin reaction. DO NOT SHAVE over the area to be treated:  To establish good contact between the Patch and the skin, excessive hair may be clipped. DO NOT place the Patch or electrodes on or over your eyes, directly over your heart, or brain. DO NOT reuse the Patch or electrodes as this may cause burns to occur.

## 2023-11-08 ENCOUNTER — Ambulatory Visit: Admitting: Physical Therapy

## 2023-11-14 ENCOUNTER — Ambulatory Visit: Attending: Emergency Medicine | Admitting: Physical Therapy

## 2023-11-14 ENCOUNTER — Encounter: Payer: Self-pay | Admitting: Physical Therapy

## 2023-11-14 DIAGNOSIS — M25522 Pain in left elbow: Secondary | ICD-10-CM | POA: Insufficient documentation

## 2023-11-14 DIAGNOSIS — M542 Cervicalgia: Secondary | ICD-10-CM | POA: Insufficient documentation

## 2023-11-14 NOTE — Therapy (Signed)
 OUTPATIENT PHYSICAL THERAPY UPPER EXTREMITY TREATMENT   Patient Name: Desiree Krueger MRN: [id number] DOB:1970/11/18, 53 y.o., female Today's Date: 11/14/2023  END OF SESSION:  PT End of Session - 11/14/23 1359     Visit Number 3    Date for PT Re-Evaluation 01/24/24    Authorization Type [employer]    PT Start Time 1400    PT Stop Time 1445    PT Time Calculation (min) 45 min    Activity Tolerance Patient tolerated treatment well    Behavior During Therapy WFL for tasks assessed/performed             Past Medical History:  Diagnosis Date   Anemia    Constipation    Depression    Dysrhythmia    GERD (gastroesophageal reflux disease)    Headache    MIGRAINES   Low blood pressure    Lower back pain    Thyroid disease    Varicose vein of leg    BILATERAL   Past Surgical History:  Procedure Laterality Date   DILATATION & CURETTAGE/HYSTEROSCOPY WITH MYOSURE N/A 01/31/2017   Procedure: DILATATION & CURETTAGE/HYSTEROSCOPY WITH MYOSURE;  Surgeon: Ok Edwards, MD;  Location: [facility];  Service: Gynecology;  Laterality: N/A;   DILATATION & CURETTAGE/HYSTEROSCOPY WITH MYOSURE N/A 09/29/2017   Procedure: DILATATION & CURETTAGE/HYSTEROSCOPY WITH MYOSURE RESECTION OF SUBMUCOSAL MYOMA;  Surgeon: Genia Del, MD;  Location: [facility];  Service: Gynecology;  Laterality: N/A;  request to follow 2nd case around 10am in [place] Gyn block time Requests one hour OR time   HYSTEROSCOPY WITH NOVASURE N/A 09/29/2017   Procedure: HYSTEROSCOPY WITH NOVASURE ENDOMETRIAL ABLATION;  Surgeon: Genia Del, MD;  Location: [facility];  Service: Gynecology;  Laterality: N/A;   TUBAL LIGATION     Patient Active Problem List   Diagnosis Date Noted   Left elbow tendinitis 08/29/2023   Left lateral epicondylitis 08/29/2023   E. coli UTI 07/03/2023   Recent urinary tract infection 06/13/2023   Pyelonephritis 05/31/2023   Dysthymia 07/05/2022    Chronic idiopathic constipation 06/23/2022   Intractable chronic migraine without aura and without status migrainosus 08/18/2021   Postmenopausal 08/18/2021   Chronic bilateral low back pain without sciatica 02/10/2021   Chronic migraine w/o aura w/o status migrainosus, not intractable 12/24/2020   Headache, new daily persistent (NDPH) 12/24/2020   Iron deficiency 08/24/2017   Elevated liver function tests 03/16/2017   Gastroesophageal reflux disease without esophagitis 01/11/2017   High risk medication use 01/11/2017   History of hyperthyroidism 01/11/2017   Hyperthyroidism 01/11/2017   Anemia due to chronic blood loss 03/16/2016   Submucous leiomyoma of uterus 03/16/2016    PCP: Alvy Bimler, MD  REFERRING PROVIDER: Chrisandra Netters, MD  REFERRING DIAG: left lateral epicondylitis  THERAPY DIAG:  Pain in left elbow  Rationale for Evaluation and Treatment: Rehabilitation  ONSET DATE: 08/29/23  SUBJECTIVE:  SUBJECTIVE STATEMENT: Reports that the last treatment she was very sore, she does report that after a few hours she did feel much better  Reports that at work she lifted something heavy and felt pain, August 17, 2023.  She reports she is a little better, she has taken meloxicam, ibuprofen and prednisone Hand dominance: Right  PERTINENT HISTORY: Reports a fall a few years ago and hurt shoulders  PAIN:  Are you having pain? Yes: NPRS scale: 5/10 Pain location: left forearm and lateral epicondyle, some pain in the left lateral upper arm to the shoulder and necl Pain description: shooting, sharp, ache Aggravating factors: twisting hand and arm, lifting, 10/10 at times Relieving factors: rest, ibuprofen pain can be 0/10  PRECAUTIONS: None  RED FLAGS: None   WEIGHT BEARING RESTRICTIONS: No  FALLS:   Has patient fallen in last 6 months? No  LIVING ENVIRONMENT: Lives with: lives with their family Lives in: House/apartment Stairs: No Has following equipment at home: None  OCCUPATION: [employer] , lifting, pushing  and pulling lifting up to 60#  PLOF: Independent and 27 month old child she lifts at times, housework  PATIENT GOALS: do my job , have no pain  NEXT MD VISIT: 3 months back to Dr. Jean Rosenthal  OBJECTIVE:  Note: Objective measures were completed at Evaluation unless otherwise noted.  DIAGNOSTIC FINDINGS:  none  PATIENT SURVEYS :  Quick Dash 77%  COGNITION: Overall cognitive status: Within functional limits for tasks assessed     SENSATION: WFL  POSTURE: Rounded shoulder, forward head  UPPER EXTREMITY ROM:   Active ROM Right eval Left eval  Shoulder flexion  160  Shoulder extension    Shoulder abduction  160  Shoulder adduction    Shoulder internal rotation  50  Shoulder external rotation  70  Elbow flexion  140  Elbow extension  10  Wrist flexion  60  Wrist extension  60  Wrist ulnar deviation    Wrist radial deviation    Wrist pronation  60  Wrist supination  60  (Blank rows = not tested)  UPPER EXTREMITY MMT:  MMT Right eval Left eval  Shoulder flexion  4  Shoulder extension    Shoulder abduction    Shoulder adduction    Shoulder internal rotation    Shoulder external rotation    Middle trapezius    Lower trapezius    Elbow flexion  4  Elbow extension  4-  Wrist flexion  4  Wrist extension  4-  Wrist ulnar deviation    Wrist radial deviation    Wrist pronation  4  Wrist supination  4-  Grip strength (lbs) 60# 40#  (Blank rows = not tested)   JOINT MOBILITY TESTING:  Tender left elbow  PALPATION:  Very tender in the wrist extensors, very tender left lateral epicondyle, tender left lateral upper arm  TREATMENT DATE:  11/14/23 STM to the left forearm and into the upper trap and rhomboid Passive stretch left wrist and forearm Passive stretch calves and piriformis as she was having some sciatica type pain Ionto left lateral epicondyle 80mA dose 4 hour patch  11/02/23 Reviewed and performed HEP Trigger Point Dry Needling  Initial Treatment: Pt instructed on Dry Needling rational, procedures, and possible side effects. Pt instructed to expect mild to moderate muscle soreness later in the day and/or into the next day.  Pt instructed in methods to reduce muscle soreness. Pt instructed to continue prescribed HEP. Patient verbalized understanding of these instructions and education.   Patient Verbal Consent Given: Yes Education Handout Provided: Yes Muscles Treated: left wrist extensors Treatment Response/Outcome: LTR STM and passive stretches to the elbows Ionto 80mA dose to the left lateral epicondyle    10/24/23 Evaluation   PATIENT EDUCATION: Education details: HEP/POC Person educated: Patient Education method: Programmer, multimedia, Demonstration, Actor cues, Verbal cues, and Handouts Education comprehension: verbalized understanding  HOME EXERCISE PROGRAM: Access Code: [email or web address] URL: [email or web address] Date: 10/24/2023 Prepared by: Stacie Glaze  Exercises - Seated Wrist Extension Stretch  - 2 x daily - 7 x weekly - 1 sets - 5 reps - 15 hold - Seated Wrist Flexion Stretch  - 2 x daily - 7 x weekly - 1 sets - 5 reps - 15 hold  ASSESSMENT:  CLINICAL IMPRESSION: Reports that she has been trying to do the correct lifting techniques and that after the needles had pain for about an hour that was bad but after that reports much better, did not want to try the needles today, she was having some reports of tension in the left upper trap and neck and the left sciatic area and the calf so we did some stretches for this  Patient is a 53 y.o. female who was seen  today for physical therapy evaluation and treatment for left lateral epicondylitis. Left elbow is very tender in the extensor mms and over the lateral epicondyle.  She is right handed, her job requires lifting, pushing and pulling up to 60#.  She reports that she has stopped doing a lot of stuff at home due to pain.   OBJECTIVE IMPAIRMENTS: decreased coordination, decreased endurance, decreased ROM, decreased strength, increased edema, increased muscle spasms, impaired flexibility, impaired UE functional use, improper body mechanics, postural dysfunction, and pain.   REHAB POTENTIAL: Good  CLINICAL DECISION MAKING: Evolving/moderate complexity  EVALUATION COMPLEXITY: Low  GOALS: Goals reviewed with patient? Yes  SHORT TERM GOALS: Target date: 11/24/23  Independent with initial HEP Baseline: Goal status: met 11/02/23  LONG TERM GOALS: Target date: 01/24/24  Independent with advanced/final HEP Baseline:  Goal status: INITIAL  2.  Understand posture and body mechanics for elbow Baseline:  Goal status: progressing 11/14/23  3.  Decrease pain 50% Baseline:  Goal status: progressing 11/14/23  4.  Increase left wrist flexion and extension to 75 degrees Baseline:  Goal status: INITIAL  5.  Increase left grip strength to 50# Baseline:  Goal status: INITIAL  PLAN: PT FREQUENCY: 1-2x/week  PT DURATION: 12 weeks  PLANNED INTERVENTIONS: 97164- PT Re-evaluation, 97110-Therapeutic exercises, 97530- Therapeutic activity, [id number]- Neuromuscular re-education, 97535- Self Care, 16109- Manual therapy, G0283- Electrical stimulation (unattended), 97035- Ultrasound, 60454- Ionotophoresis 4mg /ml Dexamethasone, Patient/Family education, Taping, Dry Needling, Joint mobilization, Cryotherapy, and Moist heat  PLAN FOR NEXT SESSION: STM. Stretches, see how she is doing, may need to add STM to the neck area   Jearld Lesch, PT 11/14/2023,  2:00 PM

## 2023-11-15 NOTE — Progress Notes (Deleted)
    Aleen Sells [facility] [address] [place] [address] Phone: [phone]   Assessment and Plan:     There are no diagnoses linked to this encounter.  ***   Pertinent previous records reviewed include ***    Follow Up: ***     Subjective:   I, Elija Mccamish, am serving as a Neurosurgeon for Doctor Richardean Sale  Chief Complaint: left elbow pain    HPI:    08/31/2023 Patient is a 53 year old female with left elbow pain. Patient states she lifted something that was heavy the pain started at her forearm and radiated up tp her shoulder . 2 weeks ago. She started meloxicam 2 days ago and that has helped. Decreased ROM and grip strength, she has to brace her elbow with her body to lift things. States that the pain in her elbow radiates down to her leg but the pain in her leg appeared months ago    09/21/2023 Patient states it hurts to put strain on it. It is getting better. When she is inactive there is no pain. When she does activity it hurts. There is a stabbing pain that radiates up her shoulder from her elbow.    10/12/2023 Patient states better but still has pain   11/16/2023 Patient states   Relevant Historical Information: GERD  Additional pertinent review of systems negative.   Current Outpatient Medications:    escitalopram (LEXAPRO) 10 MG tablet, Take 1 tablet (10 mg total) by mouth daily., Disp: 90 tablet, Rfl: 3   magic mouthwash (nystatin, lidocaine, diphenhydrAMINE, alum & mag hydroxide) suspension, Swish and spit 5 mLs 4 (four) times daily., Disp: 180 mL, Rfl: 0   methylPREDNISolone (MEDROL DOSEPAK) 4 MG TBPK tablet, Take 6 tablets on day 1.  Take 5 tablets on day 2.  Take 4 tablets on day 3.  Take 3 tablets on day 4.  Take 2 tablets on day 5.  Take 1 tablet on day 6., Disp: 21 tablet, Rfl: 0   ondansetron (ZOFRAN-ODT) 4 MG disintegrating tablet, Take 1 tablet (4 mg total) by mouth every 8 (eight) hours as needed., Disp: 20  tablet, Rfl: 0   rizatriptan (MAXALT-MLT) 10 MG disintegrating tablet, DISSOLVE ONE TABLET BY MOUTH AS NEEDED; MAY REPEAT IN 2 HOURS IF NEEDED, Disp: 12 tablet, Rfl: 11   tiZANidine (ZANAFLEX) 4 MG tablet, Take 1 tablet (4 mg total) by mouth every 6 (six) hours as needed for muscle spasms., Disp: 30 tablet, Rfl: 11   Objective:     There were no vitals filed for this visit.    There is no height or weight on file to calculate BMI.    Physical Exam:    ***   Electronically signed by:  Aleen Sells [facility] 7:43 AM 11/15/23

## 2023-11-16 ENCOUNTER — Ambulatory Visit: Payer: Managed Care, Other (non HMO) | Admitting: Sports Medicine

## 2023-11-20 ENCOUNTER — Encounter: Payer: Self-pay | Admitting: Sports Medicine

## 2023-11-22 ENCOUNTER — Telehealth: Payer: Self-pay | Admitting: Adult Health

## 2023-11-22 NOTE — Telephone Encounter (Signed)
 MYC conf

## 2023-11-24 NOTE — Progress Notes (Signed)
 [facility] [address] [place] [place] [address] [phone]       OFFICE FOLLOW UP NOTE  Ms. Amon Bali Date of Birth:  Feb 26, 1971 Medical Record Number:  [id number]    Primary neurologist: Dr. Gracie Lav Reason for visit: Migraine follow-up    SUBJECTIVE:   CHIEF COMPLAINT:  Chief Complaint  Patient presents with   Follow-up    Rm 3, pt alone, interpretor is on the way pt states that she had a migraine that lasted through the weekend. She states that she had neck pain and tension really back with this one. On average she having anywhere from 2-4 migraines a month    HPI:   Update 11/27/2023 JM: Patient returns for follow-up visit accompanied by [facility] interpreter.   She reports about 2-4 migraines per month.  She had a recent migraine that started on Thursday, 4/10 and lasted until 4/13.  Noted significant increase neck tightness associated with her migraine.  She does have prescription for tizanidine but did not use as she feels no benefit.  She does report using rizatriptan which typically helps abort migraine but did not seem to help with recent migraine.  She is mostly concerned about ongoing neck pain, reports stiffness/tightness when trying to look over her shoulder, denies any radiculopathy symptoms.  Currently working with PT for left elbow tendinitis.  She previously discontinued duloxetine as this was causing drowsiness.  She is unsure if she tried to take in the evening as she generally does not remember taking.  She is currently on Lexapro for depression/anxiety but does not feel her anxiety is well-managed, feels her mind wanders constantly and has difficulty focusing on 1 task at a time.  She plans on scheduling follow-up visit with PCP to further discuss.        History provided for reference purposes only Update 05/03/2023 JM: Patient returns for follow-up visit accompanied by [facility] interpreter.  Reports more recent  significant increased stressors at work, having issues with managers and other co-workers and is being required to work longer hours. Due to this, migraines have been increasing, typically about 4x per week with intensity based on stress level at that time. Will use rizatriptan usually with benefit, at times can cause dizziness and fatigue. Has been having a persist headache over the past 6 days. She is very tearful during visit today speaking of this. PCP started Lexapro back in November, but due to increased stress at work recently, has been having more anxiety and depression, has not yet had f/u with PCP since initiating (although 3 months f/u recommended). Patient is requesting paperwork to be completed requesting limitation of daily working hours.   Update 06/23/2022 JM: Patient returns for 36-month migraine follow-up accompanied by interpreter.  Reports approximately 2 migraines over the past month but over the past weekend, more severe and lasted for a few days. She has been dealing with constipation which can worsen her migraine headaches. Will use Aleve with bad migraine, denies over use.  Will use Maxalt occasionally if migraine persists after using Aleve, can cause some drowsiness and concerned that her body will get use to the medication and not be as effective if she uses it too much.  She is currently taking B6 supplement.  Discontinued venlafaxine after prior visit due to side effects.  She does question restarting venlafaxine as her anxiety/depression has been fully worsening.  She also complains of dizziness that can occur randomly, is not triggered by position  or quick head movement. Can occur while just sitting, lasts very short duration. Admits to only drinking 1 bottle of water per day.  Did have BMP and TSH completed today by PCP.   Update 12/15/2021 JM: Patient returns for follow-up visit after prior visit 09/13/2021 with Dr. Terrace Arabia.  She is accompanied by interpreter. Migraines have greatly  improved since prior visit. Reports about 1 migraine per month over the past couple of months. She continues on effexor 75mg  daily.  She questions decreasing dosage  as she feels she has had increased anxiety and tremulousness since starting.  She is otherwise tolerating without side effects. Unable to start CRG P injection as previously recommended due to price. Reports starting butterbur and Vit B2 a couple weeks ago with further improvement of her migraines (has mild headache today but no migraine over the past 3 weeks). Will use rizatriptan with benefit and occasionally will need to repeat rizatriptan in addition to tizanidine. Use of Zofran as needed.  She has questions regarding results of MRI that was completed 1 year ago. She does not further look into family history and has found extensive hx of migraines in her family.  no further concerns at this time.   History copied from Dr. Zannie Cove prior OV note for reference purposes only Chelesea Weiand is a 53 year old female, seen in request by her primary care physician Dr.   Alvy Bimler, [facility], for evaluation of frequent headaches, initial evaluation was on Dec 24, 2020     I reviewed and summarized the referring note. PMHx.   She reported a long history of chronic migraine headaches, started in her 30s, previous headache was very severe, over the years, she was able to identify triggers, weather change, stress, strong smells, bright light, excessive coffee intake, spicy food, cheese, red wine   She has made major changes in her diet, with some improvement of her headache, but she still has 4 headaches days in a week, she described lateralized, sometimes holoacranial pressure headaches, evolving to severe pounding headache with light noise smell sensitivity, lasting for hours   For a while, she was using frequent Excedrin Migraine almost daily basis, in February 2022, she was giving prescription of Imitrex 50 mg as needed, she used up her  monthly supply of 9 tablets, sometimes has to buy extra, and laid back on her over-the-counter medication   She never tried preventive medication in the past, her brother also suffered migraine   She denies visual loss, father passed away with Parkinson's disease, mother suffered dementia   With her frequent headaches, also complains of emotional outburst, her mood switch quickly'   UPDATE Sep 13 2021: She is no longer taking Nortriptyline, up to 20mg  qhs, did not help her headache much, cause constipation, Maxalt prn was helpful, up to 2 tabs/one day up to 4 days a week, her headache usually improve after 1 hour, she is also taking magnesium and riboflavin    She continues to complains of mood change, depression, anxiety, in tear today. She has to stay in dark all the time.   Personally reviewed MRI of the brain without contrast in May 2022: Mild small vessel disease, no acute abnormality       ROS:   14 system review of systems performed and negative with exception of this in HPI  PMH:  Past Medical History:  Diagnosis Date   Anemia    Constipation    Depression    Dysrhythmia    GERD (gastroesophageal  reflux disease)    Headache    MIGRAINES   Low blood pressure    Lower back pain    Thyroid disease    Varicose vein of leg    BILATERAL    PSH:  Past Surgical History:  Procedure Laterality Date   DILATATION & CURETTAGE/HYSTEROSCOPY WITH MYOSURE N/A 01/31/2017   Procedure: DILATATION & CURETTAGE/HYSTEROSCOPY WITH MYOSURE;  Surgeon: Davia Erps, MD;  Location: [facility];  Service: Gynecology;  Laterality: N/A;   DILATATION & CURETTAGE/HYSTEROSCOPY WITH MYOSURE N/A 09/29/2017   Procedure: DILATATION & CURETTAGE/HYSTEROSCOPY WITH MYOSURE RESECTION OF SUBMUCOSAL MYOMA;  Surgeon: Lavoie, Marie-Lyne, MD;  Location: [facility] [place];  Service: Gynecology;  Laterality: N/A;  request to follow 2nd case around 10am in [place] Gyn block time Requests one hour OR  time   HYSTEROSCOPY WITH NOVASURE N/A 09/29/2017   Procedure: HYSTEROSCOPY WITH NOVASURE ENDOMETRIAL ABLATION;  Surgeon: Lavoie, Marie-Lyne, MD;  Location: [facility];  Service: Gynecology;  Laterality: N/A;   TUBAL LIGATION      Social History:  Social History   Socioeconomic History   Marital status: Single    Spouse name: Not on file   Number of children: 2   Years of education: two years college   Highest education level: Bachelor's degree (e.g., BA, AB, BS)  Occupational History   Occupation: warehouse  Tobacco Use   Smoking status: Former    Types: Cigarettes   Smokeless tobacco: Never  Vaping Use   Vaping status: Never Used  Substance and Sexual Activity   Alcohol use: Not Currently   Drug use: Never   Sexual activity: Yes    Partners: Male    Birth control/protection: Post-menopausal, Surgical    Comment: BTL  Other Topics Concern   Not on file  Social History Narrative   Lives at home with her daughter.   Right-handed.   Caffeine use: 3 cups per day.   Social Drivers of Health   Financial Resource Strain: Medium Risk (08/29/2023)   Overall Financial Resource Strain (CARDIA)    Difficulty of Paying Living Expenses: Somewhat hard  Food Insecurity: Food Insecurity Present (08/29/2023)   Hunger Vital Sign    Worried About Running Out of Food in the Last Year: Sometimes true    Ran Out of Food in the Last Year: Patient declined  Transportation Needs: No Transportation Needs (08/29/2023)   PRAPARE - Administrator, Civil Service (Medical): No    Lack of Transportation (Non-Medical): No  Physical Activity: Insufficiently Active (08/29/2023)   Exercise Vital Sign    Days of Exercise per Week: 1 day    Minutes of Exercise per Session: 20 min  Stress: No Stress Concern Present (08/29/2023)   [employer] of Occupational Health - Occupational Stress Questionnaire    Feeling of Stress : Only a little  Social Connections: Socially  Isolated (08/29/2023)   Social Connection and Isolation Panel [NHANES]    Frequency of Communication with Friends and Family: Three times a week    Frequency of Social Gatherings with Friends and Family: Never    Attends Religious Services: Never    Database administrator or Organizations: No    Attends Engineer, structural: Not on file    Marital Status: Never married  Intimate Partner Violence: Unknown (05/27/2023)   Received from [facility]   HITS    Physically Hurt: Not on file    Insult or Talk Down To: Not on file  Threaten Physical Harm: Not on file    Scream or Curse: Not on file    Family History:  Family History  Problem Relation Age of Onset   Varicose Veins Mother    Dementia Mother    Parkinson's disease Father    Cancer Maternal Uncle        COLON    Medications:   Current Outpatient Medications on File Prior to Visit  Medication Sig Dispense Refill   escitalopram (LEXAPRO) 10 MG tablet Take 1 tablet (10 mg total) by mouth daily. 90 tablet 3   MAGNESIUM OXIDE, ANTACID, PO Take 1 tablet by mouth in the morning, at noon, in the evening, and at bedtime.     ondansetron (ZOFRAN) 4 MG tablet Take 4 mg by mouth every 8 (eight) hours as needed for nausea or vomiting.     Pediatric Multivitamins-Fl (MULTIVITAMIN + FLUORIDE) 0.25 MG CHEW Chew 1 tablet by mouth daily.     rizatriptan (MAXALT-MLT) 10 MG disintegrating tablet DISSOLVE ONE TABLET BY MOUTH AS NEEDED; MAY REPEAT IN 2 HOURS IF NEEDED 12 tablet 11   tiZANidine (ZANAFLEX) 4 MG tablet Take 1 tablet (4 mg total) by mouth every 6 (six) hours as needed for muscle spasms. 30 tablet 11   No current facility-administered medications on file prior to visit.    Allergies:  No Known Allergies    OBJECTIVE:  Physical Exam  Vitals:   11/27/23 1428  BP: (!) 97/53  Pulse: 81  Weight: 143 lb (64.9 kg)  Height: 5\' 2"  (1.575 m)    Body mass index is 26.16 kg/m. No results found.  General: well  developed, well nourished, very pleasant middle-age female, seated, tearful during visit Head: head normocephalic and atraumatic.   Neck: supple with no carotid or supraclavicular bruits Cardiovascular: regular rate and rhythm, no murmurs Musculoskeletal: no deformity; full cervical ROM with some increased tightness looking towards the left Skin:  no rash/petichiae Vascular:  Normal pulses all extremities   Neurologic Exam Mental Status: Awake and fully alert.  Limited English but unable to appreciate aphasia or dysarthria.  Oriented to place and time. Recent and remote memory intact. Attention span, concentration and fund of knowledge appropriate. Mood and affect appropriate.  Cranial Nerves:  Pupils equal, briskly reactive to light. Extraocular movements full without nystagmus. Visual fields full to confrontation. Hearing intact. Facial sensation intact. Face, tongue, palate moves normally and symmetrically.  Motor: Normal bulk and tone. Normal strength in all tested extremity muscles Sensory.: intact to touch , pinprick , position and vibratory sensation.  Coordination: Rapid alternating movements normal in all extremities. Finger-to-nose and heel-to-shin performed accurately bilaterally. Gait and Station: Arises from chair without difficulty. Stance is normal. Gait demonstrates normal stride length and balance without use of AD. Tandem walk and heel toe without difficulty.  Reflexes: 1+ and symmetric. Toes downgoing.         ASSESSMENT: Lanee Chain is a 53 y.o. year old female chronic migraine headaches.      PLAN:  Chronic migraine headaches:  Cervicalgia -Preventative: Declines interest in preventative therapy at this time.  Discussed trial of topiramate or gabapentin but declines interest.  Was encouraged to continue magnesium supplement. -Acute: Continue rizatriptan, and Zofran as needed for acute management. Start baclofen as needed, no benefit with tizanidine.  -  Referral placed to PT for cervicalgia  Tried/failed: Nortriptyline, venlafaxine, duloxetine, rizatriptan, Zofran, tizanidine, sumatriptan.   Unable to afford CRGP Would not recommend beta-blockers due to already low blood  pressure  2.  Depression/anxiety -Concern uncontrolled anxiety possibly contributing to cervicalgia and migraine headaches.  She was encouraged to follow-up with PCP and consider behavioral health evaluation.      Follow up in 6 months or call earlier if needed    CC:  PCP: Elvira Hammersmith, MD    I spent 30 minutes of face-to-face and non-face-to-face time with patient assisted by interpreter.  This included previsit chart review, lab review, study review, order entry, electronic health record documentation, patient education and discussion regarding above diagnoses and treatment plan and answered all the questions to patient satisfaction  Johny Nap, [facility]  [facility] [address] [place], [place] [address]  Phone [phone] Fax [phone] Note: This document was prepared with digital dictation and possible smart phrase technology. Any transcriptional errors that result from this process are unintentional.

## 2023-11-27 ENCOUNTER — Ambulatory Visit (INDEPENDENT_AMBULATORY_CARE_PROVIDER_SITE_OTHER): Payer: Managed Care, Other (non HMO) | Admitting: Adult Health

## 2023-11-27 ENCOUNTER — Encounter: Payer: Self-pay | Admitting: Adult Health

## 2023-11-27 VITALS — BP 97/53 | HR 81 | Ht 62.0 in | Wt 143.0 lb

## 2023-11-27 DIAGNOSIS — M542 Cervicalgia: Secondary | ICD-10-CM

## 2023-11-27 DIAGNOSIS — F32A Depression, unspecified: Secondary | ICD-10-CM | POA: Diagnosis not present

## 2023-11-27 DIAGNOSIS — F419 Anxiety disorder, unspecified: Secondary | ICD-10-CM

## 2023-11-27 DIAGNOSIS — G43709 Chronic migraine without aura, not intractable, without status migrainosus: Secondary | ICD-10-CM

## 2023-11-27 MED ORDER — BACLOFEN 10 MG PO TABS: 10.0000 mg | ORAL_TABLET | Freq: Two times a day (BID) | ORAL | 11 refills | Status: DC | PRN

## 2023-11-27 MED ORDER — RIZATRIPTAN BENZOATE 10 MG PO TBDP: 10.0000 mg | ORAL_TABLET | ORAL | 11 refills | Status: DC | PRN

## 2023-11-27 NOTE — Patient Instructions (Addendum)
 Your Plan:  Referral placed to PT for neck pain and tension which can be contributing to your headaches   Continue Maxalt as needed  Start baclofen 1 tab as needed up to twice per day for muscle tension associated with migraine headaches  Please follow up with your PCP regarding persistent anxiety as this could be contributing to your neck tension and migraine headaches       Follow up in 6 months or call earlier if needed     Thank you for coming to see us  at [facility]. I hope we have been able to provide you high quality care today.  You may receive a patient satisfaction survey over the next few weeks. We would appreciate your feedback and comments so that we may continue to improve ourselves and the health of our patients.

## 2023-12-06 ENCOUNTER — Ambulatory Visit: Admitting: Physical Therapy

## 2023-12-06 ENCOUNTER — Encounter: Payer: Self-pay | Admitting: Physical Therapy

## 2023-12-06 DIAGNOSIS — M25522 Pain in left elbow: Secondary | ICD-10-CM | POA: Diagnosis not present

## 2023-12-06 DIAGNOSIS — M542 Cervicalgia: Secondary | ICD-10-CM

## 2023-12-06 NOTE — Therapy (Signed)
 OUTPATIENT PHYSICAL THERAPY UPPER EXTREMITY TREATMENT   Patient Name: Jalisha Enneking MRN: [id number] DOB:1971-06-15, 53 y.o., female Today's Date: 12/06/2023  END OF SESSION:  PT End of Session - 12/06/23 1659     Visit Number 4    Date for PT Re-Evaluation 01/24/24    Authorization Type [employer]    PT Start Time 1658    PT Stop Time 1745    PT Time Calculation (min) 47 min    Activity Tolerance Patient tolerated treatment well    Behavior During Therapy WFL for tasks assessed/performed             Past Medical History:  Diagnosis Date   Anemia    Constipation    Depression    Dysrhythmia    GERD (gastroesophageal reflux disease)    Headache    MIGRAINES   Low blood pressure    Lower back pain    Thyroid  disease    Varicose vein of leg    BILATERAL   Past Surgical History:  Procedure Laterality Date   DILATATION & CURETTAGE/HYSTEROSCOPY WITH MYOSURE N/A 01/31/2017   Procedure: DILATATION & CURETTAGE/HYSTEROSCOPY WITH MYOSURE;  Surgeon: Davia Erps, MD;  Location: [facility];  Service: Gynecology;  Laterality: N/A;   DILATATION & CURETTAGE/HYSTEROSCOPY WITH MYOSURE N/A 09/29/2017   Procedure: DILATATION & CURETTAGE/HYSTEROSCOPY WITH MYOSURE RESECTION OF SUBMUCOSAL MYOMA;  Surgeon: Lavoie, Marie-Lyne, MD;  Location: [facility] [place];  Service: Gynecology;  Laterality: N/A;  request to follow 2nd case around 10am in [place] Gyn block time Requests one hour OR time   HYSTEROSCOPY WITH NOVASURE N/A 09/29/2017   Procedure: HYSTEROSCOPY WITH NOVASURE ENDOMETRIAL ABLATION;  Surgeon: Lavoie, Marie-Lyne, MD;  Location: [facility] [place];  Service: Gynecology;  Laterality: N/A;   TUBAL LIGATION     Patient Active Problem List   Diagnosis Date Noted   Left elbow tendinitis 08/29/2023   Left lateral epicondylitis 08/29/2023   E. coli UTI 07/03/2023   Recent urinary tract infection 06/13/2023   Pyelonephritis 05/31/2023   Dysthymia 07/05/2022    Chronic idiopathic constipation 06/23/2022   Intractable chronic migraine without aura and without status migrainosus 08/18/2021   Postmenopausal 08/18/2021   Chronic bilateral low back pain without sciatica 02/10/2021   Chronic migraine w/o aura w/o status migrainosus, not intractable 12/24/2020   Headache, new daily persistent (NDPH) 12/24/2020   Iron deficiency 08/24/2017   Elevated liver function tests 03/16/2017   Gastroesophageal reflux disease without esophagitis 01/11/2017   High risk medication use 01/11/2017   History of hyperthyroidism 01/11/2017   Hyperthyroidism 01/11/2017   Anemia due to chronic blood loss 03/16/2016   Submucous leiomyoma of uterus 03/16/2016    PCP: Vedia Geralds, MD  REFERRING PROVIDER: Sotero Duty, MD  REFERRING DIAG: left lateral epicondylitis  THERAPY DIAG:  Pain in left elbow  Cervicalgia  Rationale for Evaluation and Treatment: Rehabilitation  ONSET DATE: 08/29/23  SUBJECTIVE:  SUBJECTIVE STATEMENT: Patient with a new referral for neck pain, reports that she has had neck pain off and on for a long period, reports that she has had an increase of neck pain and a loss of ROM, some c/o HA  Reports that at work she lifted something heavy and felt pain, August 17, 2023.  She reports she is a little better, she has taken meloxicam , ibuprofen  and prednisone Hand dominance: Right  PERTINENT HISTORY: Reports a fall a few years ago and hurt shoulders  PAIN:  Are you having pain? Yes: NPRS scale: 7/10 Pain location: neck is hurting most, some LBP as well Pain description: tense and tight Aggravating factors: turning head , backing up, fast motions pain up to 10/10 Relieving factors: stretch, at best pain a 2/10  PRECAUTIONS: None  RED FLAGS: None   WEIGHT BEARING  RESTRICTIONS: No  FALLS:  Has patient fallen in last 6 months? No  LIVING ENVIRONMENT: Lives with: lives with their family Lives in: House/apartment Stairs: No Has following equipment at home: None  OCCUPATION: [employer] , lifting, pushing  and pulling lifting up to 60#  PLOF: Independent and 64 month old child she lifts at times, housework  PATIENT GOALS: do my job , have no pain  NEXT MD VISIT: 3 months back to Dr. Cleora Daft  OBJECTIVE:  Note: Objective measures were completed at Evaluation unless otherwise noted.  DIAGNOSTIC FINDINGS:  none  PATIENT SURVEYS :  Quick Dash 77%  COGNITION: Overall cognitive status: Within functional limits for tasks assessed     SENSATION: WFL  POSTURE: Rounded shoulder, forward head, has some scoliosis  CERVICAL ROM:  Decreased 50% for all motions with pain, worse with side bending with some popping  UPPER EXTREMITY ROM:   Active ROM Right eval Left eval  Shoulder flexion  160  Shoulder extension    Shoulder abduction  160  Shoulder adduction    Shoulder internal rotation  50  Shoulder external rotation  70  Elbow flexion  140  Elbow extension  10  Wrist flexion  60  Wrist extension  60  Wrist ulnar deviation    Wrist radial deviation    Wrist pronation  60  Wrist supination  60  (Blank rows = not tested)  UPPER EXTREMITY MMT:  MMT Right eval Left eval  Shoulder flexion  4  Shoulder extension    Shoulder abduction    Shoulder adduction    Shoulder internal rotation    Shoulder external rotation    Middle trapezius    Lower trapezius    Elbow flexion  4  Elbow extension  4-  Wrist flexion  4  Wrist extension  4-  Wrist ulnar deviation    Wrist radial deviation    Wrist pronation  4  Wrist supination  4-  Grip strength (lbs) 60# 40#  (Blank rows = not tested)   JOINT MOBILITY TESTING:  Tender left elbow  PALPATION:  Very tender in the wrist extensors, very tender left lateral epicondyle,  tender left lateral upper arm Very tender and tight in the upper traps and the cervical area and into the lumbar are, very tender in the rhomboid  SLR test positive at 60 degrees , very tight and painful piriformis testing  NDI = 70%  TREATMENT DATE:  12/06/23 Evaluation of the cervical spine  11/14/23 STM to the left forearm and into the upper trap and rhomboid Passive stretch left wrist and forearm Passive stretch calves and piriformis as she was having some sciatica type pain Ionto left lateral epicondyle 80mA dose 4 hour patch  11/02/23 Reviewed and performed HEP Trigger Point Dry Needling  Initial Treatment: Pt instructed on Dry Needling rational, procedures, and possible side effects. Pt instructed to expect mild to moderate muscle soreness later in the day and/or into the next day.  Pt instructed in methods to reduce muscle soreness. Pt instructed to continue prescribed HEP. Patient verbalized understanding of these instructions and education.   Patient Verbal Consent Given: Yes Education Handout Provided: Yes Muscles Treated: left wrist extensors Treatment Response/Outcome: LTR STM and passive stretches to the elbows Ionto 80mA dose to the left lateral epicondyle    10/24/23 Evaluation   PATIENT EDUCATION: Education details: HEP/POC Person educated: Patient Education method: Programmer, multimedia, Demonstration, Actor cues, Verbal cues, and Handouts Education comprehension: verbalized understanding  HOME EXERCISE PROGRAM: Access Code: [email or web address] URL: [email or web address] Date: 10/24/2023 Prepared by: Cherylene Corrente  Exercises - Seated Wrist Extension Stretch  - 2 x daily - 7 x weekly - 1 sets - 5 reps - 15 hold - Seated Wrist Flexion Stretch  - 2 x daily - 7 x weekly - 1 sets - 5 reps - 15 hold  ASSESSMENT:  CLINICAL  IMPRESSION: Reports that she has been trying to do the correct lifting techniques.  She saw an MD about 2 weeks ago and was referred to us  for evaluation and treatment of the neck and back.  She is very tender in the mms of the lumbar area, the rhomboids, the upper traps and the cervical mms.  She has limited ROM of the cervical spine and the lumbar spine.  Reports that she is having difficulty backing up the car and sleeping due to pain.  Piriformis tests tight and painful.   Patient is a 53 y.o. female who was seen today for physical therapy evaluation and treatment for left lateral epicondylitis. Left elbow is very tender in the extensor mms and over the lateral epicondyle.  She is right handed, her job requires lifting, pushing and pulling up to 60#.  She reports that she has stopped doing a lot of stuff at home due to pain.   OBJECTIVE IMPAIRMENTS: decreased coordination, decreased endurance, decreased ROM, decreased strength, increased edema, increased muscle spasms, impaired flexibility, impaired UE functional use, improper body mechanics, postural dysfunction, and pain.   REHAB POTENTIAL: Good  CLINICAL DECISION MAKING: Evolving/moderate complexity  EVALUATION COMPLEXITY: Low  GOALS: Goals reviewed with patient? Yes  SHORT TERM GOALS: Target date: 11/24/23  Independent with initial HEP Baseline: Goal status: met 11/02/23  LONG TERM GOALS: Target date: 01/24/24  Independent with advanced/final HEP Baseline:  Goal status: INITIAL  2.  Understand posture and body mechanics for elbow Baseline:  Goal status: progressing 11/14/23  3.  Decrease pain 50% Baseline:  Goal status: progressing 11/14/23  4.  Increase left wrist flexion and extension to 75 degrees Baseline:  Goal status: INITIAL  5.  Increase left grip strength to 50# Baseline:  Goal status: INITIAL             6.  Improve cervical ROM 25%  7.  Decrease cervical pain 25%  8.  Report 25% less difficulty sleeping  9.   Improve NDI score from 70% to 50%  PLAN: PT FREQUENCY:  1-2x/week  PT DURATION: 12 weeks  PLANNED INTERVENTIONS: 97164- PT Re-evaluation, 97110-Therapeutic exercises, 97530- Therapeutic activity, [id number]- Neuromuscular re-education, 97535- Self Care, 09811- Manual therapy, G0283- Electrical stimulation (unattended), 97035- Ultrasound, 91478- Ionotophoresis 4mg /ml Dexamethasone , Patient/Family education, Taping, Dry Needling, Joint mobilization, Cryotherapy, and Moist heat  PLAN FOR NEXT SESSION: STM. Stretches, see how she is doing,add STM to the neck area, flexibility, TENS   Joash Tony W, PT 12/06/2023, 5:01 PM

## 2023-12-07 NOTE — Progress Notes (Unsigned)
    Desiree Krueger D.[facility] [address] [place] [address] Phone: [phone]   Assessment and Plan:     There are no diagnoses linked to this encounter.  ***   Pertinent previous records reviewed include ***    Follow Up: ***     Subjective:   I, Manette Doto, am serving as a Neurosurgeon for Doctor Ulysees Gander  Chief Complaint: left elbow pain    HPI:    08/31/2023 Patient is a 53 year old female with left elbow pain. Patient states she lifted something that was heavy the pain started at her forearm and radiated up tp her shoulder . 2 weeks ago. She started meloxicam  2 days ago and that has helped. Decreased ROM and grip strength, she has to brace her elbow with her body to lift things. States that the pain in her elbow radiates down to her leg but the pain in her leg appeared months ago    09/21/2023 Patient states it hurts to put strain on it. It is getting better. When she is inactive there is no pain. When she does activity it hurts. There is a stabbing pain that radiates up her shoulder from her elbow.    10/12/2023 Patient states better but still has pain   12/08/2023 Patient states   Relevant Historical Information: GERD Additional pertinent review of systems negative.   Current Outpatient Medications:    baclofen  (LIORESAL ) 10 MG tablet, Take 1 tablet (10 mg total) by mouth 2 (two) times daily as needed for muscle spasms., Disp: 20 each, Rfl: 11   escitalopram  (LEXAPRO ) 10 MG tablet, Take 1 tablet (10 mg total) by mouth daily., Disp: 90 tablet, Rfl: 3   MAGNESIUM OXIDE, ANTACID, PO, Take 1 tablet by mouth in the morning, at noon, in the evening, and at bedtime., Disp: , Rfl:    ondansetron  (ZOFRAN ) 4 MG tablet, Take 4 mg by mouth every 8 (eight) hours as needed for nausea or vomiting., Disp: , Rfl:    Pediatric Multivitamins-Fl (MULTIVITAMIN + FLUORIDE) 0.25 MG CHEW, Chew 1 tablet by mouth daily., Disp: , Rfl:     rizatriptan  (MAXALT -MLT) 10 MG disintegrating tablet, Take 1 tablet (10 mg total) by mouth as needed for migraine. May repeat in 2 hours if needed, Disp: 12 tablet, Rfl: 11   Objective:     There were no vitals filed for this visit.    There is no height or weight on file to calculate BMI.    Physical Exam:    ***   Electronically signed by:  Marshall Skeeter D[facility] 7:42 AM 12/07/23

## 2023-12-08 ENCOUNTER — Ambulatory Visit

## 2023-12-08 ENCOUNTER — Ambulatory Visit (INDEPENDENT_AMBULATORY_CARE_PROVIDER_SITE_OTHER): Admitting: Sports Medicine

## 2023-12-08 VITALS — BP 90/60 | HR 83 | Ht 62.0 in | Wt 142.0 lb

## 2023-12-08 DIAGNOSIS — M7712 Lateral epicondylitis, left elbow: Secondary | ICD-10-CM

## 2023-12-08 DIAGNOSIS — M778 Other enthesopathies, not elsewhere classified: Secondary | ICD-10-CM | POA: Diagnosis not present

## 2023-12-08 DIAGNOSIS — M25522 Pain in left elbow: Secondary | ICD-10-CM

## 2023-12-08 MED ORDER — METHYLPREDNISOLONE 4 MG PO TBPK: ORAL_TABLET | ORAL | 0 refills | Status: DC

## 2023-12-08 NOTE — Patient Instructions (Signed)
 Prednisone dose pack Ask Physical Therapy to do dry needling. Wear wrist brace at all times for the next 3 to 4 weeks. Follow up in 4 to 5 weeks.    No lifting more than 15 pounds with left arm. Good until 01/12/2024.

## 2023-12-13 ENCOUNTER — Encounter: Payer: Self-pay | Admitting: Physical Therapy

## 2023-12-13 ENCOUNTER — Ambulatory Visit: Admitting: Physical Therapy

## 2023-12-13 DIAGNOSIS — M25522 Pain in left elbow: Secondary | ICD-10-CM | POA: Diagnosis not present

## 2023-12-13 DIAGNOSIS — M542 Cervicalgia: Secondary | ICD-10-CM

## 2023-12-13 NOTE — Therapy (Signed)
 OUTPATIENT PHYSICAL THERAPY UPPER EXTREMITY TREATMENT   Patient Name: Desiree Krueger MRN: [id number] DOB:November 02, 1970, 53 y.o., female Today's Date: 12/13/2023  END OF SESSION:  PT End of Session - 12/13/23 1359     Visit Number 5    Date for PT Re-Evaluation 01/24/24    Authorization Type [employer]    PT Start Time 1400    PT Stop Time 1445    PT Time Calculation (min) 45 min    Activity Tolerance Patient tolerated treatment well    Behavior During Therapy WFL for tasks assessed/performed             Past Medical History:  Diagnosis Date   Anemia    Constipation    Depression    Dysrhythmia    GERD (gastroesophageal reflux disease)    Headache    MIGRAINES   Low blood pressure    Lower back pain    Thyroid  disease    Varicose vein of leg    BILATERAL   Past Surgical History:  Procedure Laterality Date   DILATATION & CURETTAGE/HYSTEROSCOPY WITH MYOSURE N/A 01/31/2017   Procedure: DILATATION & CURETTAGE/HYSTEROSCOPY WITH MYOSURE;  Surgeon: Davia Erps, MD;  Location: [facility];  Service: Gynecology;  Laterality: N/A;   DILATATION & CURETTAGE/HYSTEROSCOPY WITH MYOSURE N/A 09/29/2017   Procedure: DILATATION & CURETTAGE/HYSTEROSCOPY WITH MYOSURE RESECTION OF SUBMUCOSAL MYOMA;  Surgeon: Lavoie, Marie-Lyne, MD;  Location: [facility] [place];  Service: Gynecology;  Laterality: N/A;  request to follow 2nd case around 10am in [place] Gyn block time Requests one hour OR time   HYSTEROSCOPY WITH NOVASURE N/A 09/29/2017   Procedure: HYSTEROSCOPY WITH NOVASURE ENDOMETRIAL ABLATION;  Surgeon: Lavoie, Marie-Lyne, MD;  Location: [facility] [place];  Service: Gynecology;  Laterality: N/A;   TUBAL LIGATION     Patient Active Problem List   Diagnosis Date Noted   Left elbow tendinitis 08/29/2023   Left lateral epicondylitis 08/29/2023   E. coli UTI 07/03/2023   Recent urinary tract infection 06/13/2023   Pyelonephritis 05/31/2023   Dysthymia 07/05/2022    Chronic idiopathic constipation 06/23/2022   Intractable chronic migraine without aura and without status migrainosus 08/18/2021   Postmenopausal 08/18/2021   Chronic bilateral low back pain without sciatica 02/10/2021   Chronic migraine w/o aura w/o status migrainosus, not intractable 12/24/2020   Headache, new daily persistent (NDPH) 12/24/2020   Iron deficiency 08/24/2017   Elevated liver function tests 03/16/2017   Gastroesophageal reflux disease without esophagitis 01/11/2017   High risk medication use 01/11/2017   History of hyperthyroidism 01/11/2017   Hyperthyroidism 01/11/2017   Anemia due to chronic blood loss 03/16/2016   Submucous leiomyoma of uterus 03/16/2016    PCP: Vedia Geralds, MD  REFERRING PROVIDER: Sotero Duty, MD  REFERRING DIAG: left lateral epicondylitis  THERAPY DIAG:  Pain in left elbow  Cervicalgia  Rationale for Evaluation and Treatment: Rehabilitation  ONSET DATE: 08/29/23  SUBJECTIVE:  SUBJECTIVE STATEMENT: Patient reports that she feels that she is moving a little better with a little better ROM and less pain, still tight and sore with some limited ROM  Patient with a new referral for neck pain, reports that she has had neck pain off and on for a long period, reports that she has had an increase of neck pain and a loss of ROM, some c/o HA  Reports that at work she lifted something heavy and felt pain, August 17, 2023.  She reports she is a little better, she has taken meloxicam , ibuprofen  and prednisone Hand dominance: Right  PERTINENT HISTORY: Reports a fall a few years ago and hurt shoulders  PAIN:  Are you having pain? Yes: NPRS scale: 7/10 Pain location: neck is hurting most, some LBP as well Pain description: tense and tight Aggravating factors: turning head ,  backing up, fast motions pain up to 10/10 Relieving factors: stretch, at best pain a 2/10  PRECAUTIONS: None  RED FLAGS: None   WEIGHT BEARING RESTRICTIONS: No  FALLS:  Has patient fallen in last 6 months? No  LIVING ENVIRONMENT: Lives with: lives with their family Lives in: House/apartment Stairs: No Has following equipment at home: None  OCCUPATION: [employer] , lifting, pushing  and pulling lifting up to 60#  PLOF: Independent and 62 month old child she lifts at times, housework  PATIENT GOALS: do my job , have no pain  NEXT MD VISIT: 3 months back to Dr. Cleora Daft  OBJECTIVE:  Note: Objective measures were completed at Evaluation unless otherwise noted.  DIAGNOSTIC FINDINGS:  none  PATIENT SURVEYS :  Quick Dash 77%  COGNITION: Overall cognitive status: Within functional limits for tasks assessed     SENSATION: WFL  POSTURE: Rounded shoulder, forward head, has some scoliosis  CERVICAL ROM:  Decreased 50% for all motions with pain, worse with side bending with some popping  UPPER EXTREMITY ROM:   Active ROM Right eval Left eval  Shoulder flexion  160  Shoulder extension    Shoulder abduction  160  Shoulder adduction    Shoulder internal rotation  50  Shoulder external rotation  70  Elbow flexion  140  Elbow extension  10  Wrist flexion  60  Wrist extension  60  Wrist ulnar deviation    Wrist radial deviation    Wrist pronation  60  Wrist supination  60  (Blank rows = not tested)  UPPER EXTREMITY MMT:  MMT Right eval Left eval  Shoulder flexion  4  Shoulder extension    Shoulder abduction    Shoulder adduction    Shoulder internal rotation    Shoulder external rotation    Middle trapezius    Lower trapezius    Elbow flexion  4  Elbow extension  4-  Wrist flexion  4  Wrist extension  4-  Wrist ulnar deviation    Wrist radial deviation    Wrist pronation  4  Wrist supination  4-  Grip strength (lbs) 60# 40#  (Blank rows  = not tested)   JOINT MOBILITY TESTING:  Tender left elbow  PALPATION:  Very tender in the wrist extensors, very tender left lateral epicondyle, tender left lateral upper arm Very tender and tight in the upper traps and the cervical area and into the lumbar are, very tender in the rhomboid  SLR test positive at 60 degrees , very tight and painful piriformis testing  NDI = 70%  TREATMENT DATE:  12/13/23 STM to the left elbow STM to the cervical spine, rhomboids and the upper traps Passive stretch neck and wrist/elbow Went over posture and body mechanics for neck and elbow  12/06/23 Evaluation of the cervical spine  11/14/23 STM to the left forearm and into the upper trap and rhomboid Passive stretch left wrist and forearm Passive stretch calves and piriformis as she was having some sciatica type pain Ionto left lateral epicondyle 80mA dose 4 hour patch  11/02/23 Reviewed and performed HEP Trigger Point Dry Needling  Initial Treatment: Pt instructed on Dry Needling rational, procedures, and possible side effects. Pt instructed to expect mild to moderate muscle soreness later in the day and/or into the next day.  Pt instructed in methods to reduce muscle soreness. Pt instructed to continue prescribed HEP. Patient verbalized understanding of these instructions and education.   Patient Verbal Consent Given: Yes Education Handout Provided: Yes Muscles Treated: left wrist extensors Treatment Response/Outcome: LTR STM and passive stretches to the elbows Ionto 80mA dose to the left lateral epicondyle    10/24/23 Evaluation   PATIENT EDUCATION: Education details: HEP/POC Person educated: Patient Education method: Programmer, multimedia, Demonstration, Actor cues, Verbal cues, and Handouts Education comprehension: verbalized understanding  HOME EXERCISE  PROGRAM: Access Code: [email or web address] URL: [email or web address] Date: 10/24/2023 Prepared by: Cherylene Corrente  Exercises - Seated Wrist Extension Stretch  - 2 x daily - 7 x weekly - 1 sets - 5 reps - 15 hold - Seated Wrist Flexion Stretch  - 2 x daily - 7 x weekly - 1 sets - 5 reps - 15 hold  ASSESSMENT:  CLINICAL IMPRESSION: Patient reports that she has been in a brace much more, still some soreness and tightness, has difficulty pulling and lifting.  I really went back over posture and body mechanics with her and she feels that she understands.  I did add some STM. Patient is a 53 y.o. female who was seen today for physical therapy evaluation and treatment for left lateral epicondylitis. Left elbow is very tender in the extensor mms and over the lateral epicondyle.  She is right handed, her job requires lifting, pushing and pulling up to 60#.  She reports that she has stopped doing a lot of stuff at home due to pain.   OBJECTIVE IMPAIRMENTS: decreased coordination, decreased endurance, decreased ROM, decreased strength, increased edema, increased muscle spasms, impaired flexibility, impaired UE functional use, improper body mechanics, postural dysfunction, and pain.   REHAB POTENTIAL: Good  CLINICAL DECISION MAKING: Evolving/moderate complexity  EVALUATION COMPLEXITY: Low  GOALS: Goals reviewed with patient? Yes  SHORT TERM GOALS: Target date: 11/24/23  Independent with initial HEP Baseline: Goal status: met 11/02/23  LONG TERM GOALS: Target date: 01/24/24  Independent with advanced/final HEP Baseline:  Goal status:ongoing 12/13/23  2.  Understand posture and body mechanics for elbow Baseline:  Goal status:met 12/13/23  3.  Decrease pain 50% Baseline:  Goal status: progressing 11/14/23  4.  Increase left wrist flexion and extension to 75 degrees Baseline:  Goal status: INITIAL  5.  Increase left grip strength to 50# Baseline:  Goal status: INITIAL              6.  Improve cervical ROM 25%  7.  Decrease cervical pain 25%  8.  Report 25% less difficulty sleeping  9.  Improve NDI score from 70% to 50%  PLAN: PT FREQUENCY: 1-2x/week  PT DURATION: 12 weeks  PLANNED INTERVENTIONS: 97164- PT Re-evaluation, 97110-Therapeutic exercises, 97530- Therapeutic activity,  40981- Neuromuscular re-education, [id number]- Self Care, 19147- Manual therapy, G0283- Electrical stimulation (unattended), [id number]- Ultrasound, [id number]- Ionotophoresis 4mg /ml Dexamethasone , Patient/Family education, Taping, Dry Needling, Joint mobilization, Cryotherapy, and Moist heat  PLAN FOR NEXT SESSION: STM. Stretches, see how she is doing,add STM to the neck area, flexibility, TENS   Azel Gumina W, PT 12/13/2023, 1:59 PM

## 2023-12-15 ENCOUNTER — Encounter: Payer: Self-pay | Admitting: Sports Medicine

## 2024-01-10 ENCOUNTER — Encounter: Payer: Self-pay | Admitting: Physical Therapy

## 2024-01-10 ENCOUNTER — Ambulatory Visit: Attending: Emergency Medicine | Admitting: Physical Therapy

## 2024-01-10 DIAGNOSIS — M25522 Pain in left elbow: Secondary | ICD-10-CM | POA: Diagnosis present

## 2024-01-10 DIAGNOSIS — M542 Cervicalgia: Secondary | ICD-10-CM | POA: Diagnosis present

## 2024-01-10 NOTE — Therapy (Signed)
 OUTPATIENT PHYSICAL THERAPY UPPER EXTREMITY TREATMENT   Patient Name: Desiree Krueger MRN: [id number] DOB:11/21/1970, 53 y.o., female Today's Date: 01/10/2024  END OF SESSION:  PT End of Session - 01/10/24 1312     Visit Number 6    Date for PT Re-Evaluation 01/24/24    Authorization Type [employer]    PT Start Time 1312    PT Stop Time 1358    PT Time Calculation (min) 46 min    Activity Tolerance Patient tolerated treatment well    Behavior During Therapy WFL for tasks assessed/performed             Past Medical History:  Diagnosis Date   Anemia    Constipation    Depression    Dysrhythmia    GERD (gastroesophageal reflux disease)    Headache    MIGRAINES   Low blood pressure    Lower back pain    Thyroid  disease    Varicose vein of leg    BILATERAL   Past Surgical History:  Procedure Laterality Date   DILATATION & CURETTAGE/HYSTEROSCOPY WITH MYOSURE N/A 01/31/2017   Procedure: DILATATION & CURETTAGE/HYSTEROSCOPY WITH MYOSURE;  Surgeon: Davia Erps, MD;  Location: [facility];  Service: Gynecology;  Laterality: N/A;   DILATATION & CURETTAGE/HYSTEROSCOPY WITH MYOSURE N/A 09/29/2017   Procedure: DILATATION & CURETTAGE/HYSTEROSCOPY WITH MYOSURE RESECTION OF SUBMUCOSAL MYOMA;  Surgeon: Lavoie, Marie-Lyne, MD;  Location: [facility];  Service: Gynecology;  Laterality: N/A;  request to follow 2nd case around 10am in [place] [facility] block time Requests one hour OR time   HYSTEROSCOPY WITH NOVASURE N/A 09/29/2017   Procedure: HYSTEROSCOPY WITH NOVASURE ENDOMETRIAL ABLATION;  Surgeon: Lavoie, Marie-Lyne, MD;  Location: [facility];  Service: Gynecology;  Laterality: N/A;   TUBAL LIGATION     Patient Active Problem List   Diagnosis Date Noted   Left elbow tendinitis 08/29/2023   Left lateral epicondylitis 08/29/2023   E. coli UTI 07/03/2023   Recent urinary tract infection 06/13/2023   Pyelonephritis 05/31/2023   Dysthymia 07/05/2022    Chronic idiopathic constipation 06/23/2022   Intractable chronic migraine without aura and without status migrainosus 08/18/2021   Postmenopausal 08/18/2021   Chronic bilateral low back pain without sciatica 02/10/2021   Chronic migraine w/o aura w/o status migrainosus, not intractable 12/24/2020   Headache, new daily persistent ([facility]) 12/24/2020   Iron deficiency 08/24/2017   Elevated liver function tests 03/16/2017   Gastroesophageal reflux disease without esophagitis 01/11/2017   High risk medication use 01/11/2017   History of hyperthyroidism 01/11/2017   Hyperthyroidism 01/11/2017   Anemia due to chronic blood loss 03/16/2016   Submucous leiomyoma of uterus 03/16/2016    PCP: Vedia Geralds, MD  REFERRING PROVIDER: Sotero Duty, MD  REFERRING DIAG: left lateral epicondylitis  THERAPY DIAG:  Pain in left elbow  Cervicalgia  Rationale for Evaluation and Treatment: Rehabilitation  ONSET DATE: 08/29/23  SUBJECTIVE:  SUBJECTIVE STATEMENT: Patient reports that she has overall been doing better, she is having left hand pain, she reports that the left elbow and the neck are doing better.  She does report that she was out of work for vacation.  Reports better neck motions for driving the car  Patient with a new referral for neck pain, reports that she has had neck pain off and on for a long period, reports that she has had an increase of neck pain and a loss of ROM, some c/o HA  Reports that at work she lifted something heavy and felt pain, August 17, 2023.  She reports she is a little better, she has taken meloxicam , ibuprofen  and prednisone Hand dominance: Right  PERTINENT HISTORY: Reports a fall a few years ago and hurt shoulders  PAIN:  Are you having pain? Yes: NPRS scale: 3/10 Pain location: neck is  hurting most, some LBP as well Pain description: tense and tight Aggravating factors: turning head , backing up, fast motions pain up to 10/10 Relieving factors: stretch, at best pain a 2/10  PRECAUTIONS: None  RED FLAGS: None   WEIGHT BEARING RESTRICTIONS: No  FALLS:  Has patient fallen in last 6 months? No  LIVING ENVIRONMENT: Lives with: lives with their family Lives in: House/apartment Stairs: No Has following equipment at home: None  OCCUPATION: [employer] , lifting, pushing  and pulling lifting up to 60#  PLOF: Independent and 69 month old child she lifts at times, housework  PATIENT GOALS: do my job , have no pain  NEXT MD VISIT: 3 months back to Dr. Cleora Daft  OBJECTIVE:  Note: Objective measures were completed at Evaluation unless otherwise noted.  DIAGNOSTIC FINDINGS:  none  PATIENT SURVEYS :  Quick Dash 77%  COGNITION: Overall cognitive status: Within functional limits for tasks assessed     SENSATION: WFL  POSTURE: Rounded shoulder, forward head, has some scoliosis  CERVICAL ROM:  Decreased 50% for all motions with pain, worse with side bending with some popping  UPPER EXTREMITY ROM:   Active ROM Right eval Left eval  Shoulder flexion  160  Shoulder extension    Shoulder abduction  160  Shoulder adduction    Shoulder internal rotation  50  Shoulder external rotation  70  Elbow flexion  140  Elbow extension  10  Wrist flexion  60  Wrist extension  60  Wrist ulnar deviation    Wrist radial deviation    Wrist pronation  60  Wrist supination  60  (Blank rows = not tested)  UPPER EXTREMITY MMT:  MMT Right eval Left eval  Shoulder flexion  4  Shoulder extension    Shoulder abduction    Shoulder adduction    Shoulder internal rotation    Shoulder external rotation    Middle trapezius    Lower trapezius    Elbow flexion  4  Elbow extension  4-  Wrist flexion  4  Wrist extension  4-  Wrist ulnar deviation    Wrist  radial deviation    Wrist pronation  4  Wrist supination  4-  Grip strength (lbs) 60# 40#  (Blank rows = not tested)   JOINT MOBILITY TESTING:  Tender left elbow  PALPATION:  Very tender in the wrist extensors, very tender left lateral epicondyle, tender left lateral upper arm Very tender and tight in the upper traps and the cervical area and into the lumbar are, very tender in the rhomboid  SLR test positive  at 60 degrees , very tight and painful piriformis testing  NDI = 70%                                                                                                                             TREATMENT DATE:  01/10/24 NDI = 40% Reviewed and went over and demonstrated posture and body mechanics, for work and ADL's, Patient verbalized understanding and asked good questions and I worked to answer these Passive stretch to the elbow, wrist, shoulder and neck STM to the left upper trap, neck and wrist extensors Cervical ROM decreased 25% for side bending and rotation  12/13/23 STM to the left elbow STM to the cervical spine, rhomboids and the upper traps Passive stretch neck and wrist/elbow Went over posture and body mechanics for neck and elbow  12/06/23 Evaluation of the cervical spine  11/14/23 STM to the left forearm and into the upper trap and rhomboid Passive stretch left wrist and forearm Passive stretch calves and piriformis as she was having some sciatica type pain Ionto left lateral epicondyle 80mA dose 4 hour patch  11/02/23 Reviewed and performed HEP Trigger Point Dry Needling  Initial Treatment: Pt instructed on Dry Needling rational, procedures, and possible side effects. Pt instructed to expect mild to moderate muscle soreness later in the day and/or into the next day.  Pt instructed in methods to reduce muscle soreness. Pt instructed to continue prescribed HEP. Patient verbalized understanding of these instructions and education.   Patient Verbal Consent  Given: Yes Education Handout Provided: Yes Muscles Treated: left wrist extensors Treatment Response/Outcome: LTR STM and passive stretches to the elbows Ionto 80mA dose to the left lateral epicondyle    10/24/23 Evaluation   PATIENT EDUCATION: Education details: HEP/POC Person educated: Patient Education method: Programmer, multimedia, Demonstration, Actor cues, Verbal cues, and Handouts Education comprehension: verbalized understanding  HOME EXERCISE PROGRAM: Access Code: [email or web address] URL: [email or web address] Date: 10/24/2023 Prepared by: Cherylene Corrente  Exercises - Seated Wrist Extension Stretch  - 2 x daily - 7 x weekly - 1 sets - 5 reps - 15 hold - Seated Wrist Flexion Stretch  - 2 x daily - 7 x weekly - 1 sets - 5 reps - 15 hold  ASSESSMENT:  CLINICAL IMPRESSION: Patient has been on vacation and overall is doing much better, she is having less pain in the elbow and neck.  She still has some limitations with rotation and side bending, her cervical ROM is 25% better, she does report that she feels that she is watching how she lifts things but sometimes forgets, she does report that she is still doing light duty. Patient is a 53 y.o. female who was seen today for physical therapy evaluation and treatment for left lateral epicondylitis. Left elbow is very tender in the extensor mms and over the lateral epicondyle.  She is right handed, her job requires lifting, pushing and pulling up to 60#.  She reports that she has stopped doing  a lot of stuff at home due to pain.   OBJECTIVE IMPAIRMENTS: decreased coordination, decreased endurance, decreased ROM, decreased strength, increased edema, increased muscle spasms, impaired flexibility, impaired UE functional use, improper body mechanics, postural dysfunction, and pain.   REHAB POTENTIAL: Good  CLINICAL DECISION MAKING: Evolving/moderate complexity  EVALUATION COMPLEXITY: Low  GOALS: Goals reviewed with patient?  Yes  SHORT TERM GOALS: Target date: 11/24/23  Independent with initial HEP Baseline: Goal status: met 11/02/23  LONG TERM GOALS: Target date: 01/24/24  Independent with advanced/final HEP Baseline:  Goal status progressing 01/10/24  2.  Understand posture and body mechanics for elbow Baseline:  Goal status:met 12/13/23  3.  Decrease pain 50% Baseline:  Goal status: progressing 01/10/24  4.  Increase left wrist flexion and extension to 75 degrees Baseline:  Goal status: met 01/10/24  5.  Increase left grip strength to 50# Baseline:  Goal status: progressing 01/10/24             6.  Improve cervical ROM 25%  Met 01/10/24  7.  Decrease cervical pain 25%  met 01/10/24  8.  Report 25% less difficulty sleeping  9.  Improve NDI score from 70% to 50%  met 01/10/24  PLAN: PT FREQUENCY: 1-2x/week  PT DURATION: 12 weeks  PLANNED INTERVENTIONS: 97164- PT Re-evaluation, 97110-Therapeutic exercises, 97530- Therapeutic activity, 97112- Neuromuscular re-education, 97535- Self Care, 69629- Manual therapy, G0283- Electrical stimulation (unattended), 97035- Ultrasound, 52841- Ionotophoresis 4mg /ml Dexamethasone , Patient/Family education, Taping, Dry Needling, Joint mobilization, Cryotherapy, and Moist heat  PLAN FOR NEXT SESSION: she is to see MD next week, we may have to ask for visits from the insurance if we are to [employer], PT 01/10/2024, 1:12 PM

## 2024-01-17 NOTE — Progress Notes (Unsigned)
Ben Jackson D.[facility] [address] [place] [address] Phone: [phone]   Assessment and Plan:     There are no diagnoses linked to this encounter.  ***   Pertinent previous records reviewed include ***    Follow Up: ***     Subjective:   I, Shuntae Herzig, am serving as a Neurosurgeon for Doctor Ulysees Gander  Chief Complaint: left elbow pain    HPI:    08/31/2023 Patient is a 53 year old female with left elbow pain. Patient states she lifted something that was heavy the pain started at her forearm and radiated up tp her shoulder . 2 weeks ago. She started meloxicam  2 days ago and that has helped. Decreased ROM and grip strength, she has to brace her elbow with her body to lift things. States that the pain in her elbow radiates down to her leg but the pain in her leg appeared months ago    09/21/2023 Patient states it hurts to put strain on it. It is getting better. When she is inactive there is no pain. When she does activity it hurts. There is a stabbing pain that radiates up her shoulder from her elbow.    10/12/2023 Patient states better but still has pain    12/08/2023 Patient states elbow pain started in January of this year. She has to do slow movements or there is a sharp pain in the elbow and pain in the forearm. Injured arm by picking up something heavy. Uses the arm throughout the day and by the end of the day she has to slowly relax that arm because it becomes very painful. Pain does not wake her up during the night but when she gets up in the morning the pain is there.    Duration? Since January Did you have an Injury to cause this pain?yes Taking Medication for pain? Voltaren  Numbness or Tingling? yes Does the pain Radiate? yes Altered gait or use? yes ROM/ impairment of movement? yes   01/18/2024 Patient states   Relevant Historical Information: GERD  Additional pertinent review of systems negative.   Current  Outpatient Medications:    baclofen  (LIORESAL ) 10 MG tablet, Take 1 tablet (10 mg total) by mouth 2 (two) times daily as needed for muscle spasms., Disp: 20 each, Rfl: 11   escitalopram  (LEXAPRO ) 10 MG tablet, Take 1 tablet (10 mg total) by mouth daily., Disp: 90 tablet, Rfl: 3   MAGNESIUM OXIDE, ANTACID, PO, Take 1 tablet by mouth in the morning, at noon, in the evening, and at bedtime., Disp: , Rfl:    methylPREDNISolone  (MEDROL  DOSEPAK) 4 MG TBPK tablet, Follow instructions on the package., Disp: 21 tablet, Rfl: 0   ondansetron  (ZOFRAN ) 4 MG tablet, Take 4 mg by mouth every 8 (eight) hours as needed for nausea or vomiting., Disp: , Rfl:    Pediatric Multivitamins-Fl (MULTIVITAMIN + FLUORIDE) 0.25 MG CHEW, Chew 1 tablet by mouth daily., Disp: , Rfl:    rizatriptan  (MAXALT -MLT) 10 MG disintegrating tablet, Take 1 tablet (10 mg total) by mouth as needed for migraine. May repeat in 2 hours if needed, Disp: 12 tablet, Rfl: 11   Objective:     There were no vitals filed for this visit.    There is no height or weight on file to calculate BMI.    Physical Exam:    ***   Electronically signed by:  Marshall Skeeter D.[facility] 7:38 AM  01/17/24 

## 2024-01-18 ENCOUNTER — Ambulatory Visit: Admitting: Sports Medicine

## 2024-01-18 VITALS — HR 94 | Ht 62.0 in | Wt 145.0 lb

## 2024-01-18 DIAGNOSIS — M7712 Lateral epicondylitis, left elbow: Secondary | ICD-10-CM | POA: Diagnosis not present

## 2024-01-18 DIAGNOSIS — M25522 Pain in left elbow: Secondary | ICD-10-CM | POA: Diagnosis not present

## 2024-01-18 DIAGNOSIS — M65332 Trigger finger, left middle finger: Secondary | ICD-10-CM

## 2024-01-18 NOTE — Patient Instructions (Addendum)
 Work note provided no lifting more than 15 pounds 4 weeks  4 week follow up  Consider shock waved therapy for 225$ total or PRP 500$

## 2024-02-06 NOTE — Progress Notes (Signed)
 [facility] [address] [place] [address] Phone: [phone]   Assessment and Plan:     1. Left elbow pain 2. Left lateral epicondylitis -Chronic with exacerbation, subsequent visit - Recurrence of left lateral elbow pain most consistent with lateral epicondylitis.  Likely flared due to physical activity at work - Continue using wrist brace - Start Celebrex 200 mg twice daily x2 weeks.  If still having pain after 2 weeks, complete 3rd-week of NSAID. May use remaining NSAID as needed once daily for pain control.  Do not to use additional over-the-counter NSAIDs (ibuprofen , naproxen, Advil , Aleve, etc.) while taking prescription NSAIDs.  May use Tylenol  [phone] mg 2 to 3 times a day for breakthrough pain. -Continue HEP.  Patient completed physical therapy - Recommend no lifting >15 pounds for the next 4 weeks or until reevaluated  3. Right elbow pain 4. Medial epicondylitis of elbow, right -Acute, initial visit - Right medial elbow pain most consistent with medial epicondylitis.  Likely flared due to physical activity at work - Start using wrist brace - Start Celebrex 200 mg twice daily x2 weeks.  If still having pain after 2 weeks, complete 3rd-week of NSAID. May use remaining NSAID as needed once daily for pain control.  Do not to use additional over-the-counter NSAIDs (ibuprofen , naproxen, Advil , Aleve, etc.) while taking prescription NSAIDs.  May use Tylenol  [phone] mg 2 to 3 times a day for breakthrough pain. -Start HEP - Recommend no lifting >15 pounds for the next 4 weeks or until reevaluated  Pertinent previous records reviewed include none  Follow Up: 4 weeks for reevaluation.  If no improvement or worsening symptoms at follow-up visit, could consider needling versus ECSWT versus PRP   Subjective:   I, Desiree Krueger am a scribe for  Dr. Leonce.    Chief Complaint: left elbow pain    HPI:     08/31/2023 Patient is a 53 year old female with left elbow pain. Patient states she lifted something that was heavy the pain started at her forearm and radiated up tp her shoulder . 2 weeks ago. She started meloxicam  2 days ago and that has helped. Decreased ROM and grip strength, she has to brace her elbow with her body to lift things. States that the pain in her elbow radiates down to her leg but the pain in her leg appeared months ago    09/21/2023 Patient states it hurts to put strain on it. It is getting better. When she is inactive there is no pain. When she does activity it hurts. There is a stabbing pain that radiates up her shoulder from her elbow.    10/12/2023 Patient states better but still has pain    12/08/2023 Patient states elbow pain started in January of this year. She has to do slow movements or there is a sharp pain in the elbow and pain in the forearm. Injured arm by picking up something heavy. Uses the arm throughout the day and by the end of the day she has to slowly relax that arm because it becomes very painful. Pain does not wake her up during the night but when she gets up in the morning the pain is there.    Duration? Since January Did you have an Injury to cause this pain?yes Taking Medication for pain? Voltaren  Numbness or Tingling? yes Does the pain Radiate? yes Altered gait or use? yes ROM/ impairment of movement? yes   01/18/2024  Patient states a little bit better but not a 100% . Has pain when she tries to work her normal pace it causes pain. She has a knot on her left  3rd digit   02/15/2024 Patient states doing good. Monday she hurt her arm again. The pain is consistent. Carrying the boxes and took of the lid roughly and the arm started hurting again. Right elbow pain and right wrist pain. When she moves her wrist a certain way it does a cracking sound.    Relevant Historical Information: GERD  Additional pertinent review of systems negative.   Current  Outpatient Medications:    baclofen  (LIORESAL ) 10 MG tablet, Take 1 tablet (10 mg total) by mouth 2 (two) times daily as needed for muscle spasms., Disp: 20 each, Rfl: 11   celecoxib (CELEBREX) 200 MG capsule, Take 1 capsule (200 mg total) by mouth 2 (two) times daily., Disp: 42 capsule, Rfl: 0   escitalopram  (LEXAPRO ) 10 MG tablet, Take 1 tablet (10 mg total) by mouth daily., Disp: 90 tablet, Rfl: 3   MAGNESIUM OXIDE, ANTACID, PO, Take 1 tablet by mouth in the morning, at noon, in the evening, and at bedtime., Disp: , Rfl:    methylPREDNISolone  (MEDROL  DOSEPAK) 4 MG TBPK tablet, Follow instructions on the package., Disp: 21 tablet, Rfl: 0   ondansetron  (ZOFRAN ) 4 MG tablet, Take 4 mg by mouth every 8 (eight) hours as needed for nausea or vomiting., Disp: , Rfl:    Pediatric Multivitamins-Fl (MULTIVITAMIN + FLUORIDE) 0.25 MG CHEW, Chew 1 tablet by mouth daily., Disp: , Rfl:    rizatriptan  (MAXALT -MLT) 10 MG disintegrating tablet, Take 1 tablet (10 mg total) by mouth as needed for migraine. May repeat in 2 hours if needed, Disp: 12 tablet, Rfl: 11   Objective:     Vitals:   02/15/24 1425  BP: 110/60  Pulse: 83  SpO2: 94%  Height: 5' 2 (1.575 m)      Body mass index is 26.52 kg/m.    Physical Exam:    General: Appears well, no acute distress, nontoxic and pleasant Neck: FROM, no pain Neuro: sensation is intact distally with no deficits, strenghth is 5/5 in elbow flexors/extenders/supinator/pronators and wrist flexors/extensors Psych: no evidence of anxiety or depression   Bilateral elbow: No deformity, swelling or muscle wasting Normal Carrying angle ROM:0-140, supination and pronation 90 TTP left lateral epicondyle, left supinator, right pronator, right medial epicondyle NTTP over triceps, ticeps tendon, olecronon,  antecubital fossa, biceps tendon,     Negative tinnels over cubital tunnel Right sided pain with resisted wrist and middle digit extension Left-sided pain with  resisted wrist flexion Right sided pain with resisted supination Left-sided pain with resisted pronation Negative valgus stress Negative varus stress Negative milking maneuver     Electronically signed by:  Odis Mace D.[facility] 3:17 PM 02/15/24

## 2024-02-15 ENCOUNTER — Ambulatory Visit: Admitting: Sports Medicine

## 2024-02-15 VITALS — BP 110/60 | HR 83 | Ht 62.0 in

## 2024-02-15 DIAGNOSIS — M7712 Lateral epicondylitis, left elbow: Secondary | ICD-10-CM | POA: Diagnosis not present

## 2024-02-15 DIAGNOSIS — M25521 Pain in right elbow: Secondary | ICD-10-CM

## 2024-02-15 DIAGNOSIS — M7701 Medial epicondylitis, right elbow: Secondary | ICD-10-CM | POA: Diagnosis not present

## 2024-02-15 DIAGNOSIS — M25522 Pain in left elbow: Secondary | ICD-10-CM

## 2024-02-15 MED ORDER — CELECOXIB 200 MG PO CAPS: 200.0000 mg | ORAL_CAPSULE | Freq: Two times a day (BID) | ORAL | 0 refills | Status: DC

## 2024-02-15 NOTE — Patient Instructions (Addendum)
 Celebrex 200 mg 2x a day for 3 weeks  4 week follow up  Use wrist brace on both wrist  Work note provided no lifitng more than 15 pounds for 4 weeks  4 week follow up

## 2024-03-14 NOTE — Progress Notes (Unsigned)
 Desiree Krueger Desiree Krueger [address] [place] [address] Phone: [phone]   Assessment and Plan:     There are no diagnoses linked to this encounter.  ***   Pertinent previous records reviewed include ***    Follow Up: ***     Subjective:   I, Desiree Krueger, am serving as a Neurosurgeon for Doctor Desiree Krueger   Chief Complaint: left elbow pain    HPI:    08/31/2023 Patient is a 53 year old female with left elbow pain. Patient states she lifted something that was heavy the pain started at her forearm and radiated up tp her shoulder . 2 weeks ago. She started meloxicam  2 days ago and that has helped. Decreased ROM and grip strength, she has to brace her elbow with her body to lift things. States that the pain in her elbow radiates down to her leg but the pain in her leg appeared months ago    09/21/2023 Patient states it hurts to put strain on it. It is getting better. When she is inactive there is no pain. When she does activity it hurts. There is a stabbing pain that radiates up her shoulder from her elbow.    10/12/2023 Patient states better but still has pain    12/08/2023 Patient states elbow pain started in January of this year. She has to do slow movements or there is a sharp pain in the elbow and pain in the forearm. Injured arm by picking up something heavy. Uses the arm throughout the day and by the end of the day she has to slowly relax that arm because it becomes very painful. Pain does not wake her up during the night but when she gets up in the morning the pain is there.    Duration? Since January Did you have an Injury to cause this pain?yes Taking Medication for pain? Voltaren  Numbness or Tingling? yes Does the pain Radiate? yes Altered gait or use? yes ROM/ impairment of movement? yes   01/18/2024 Patient states a little bit better but not a 100% . Has pain when she tries to work her normal pace it causes pain. She  has a knot on her left  3rd digit    02/15/2024 Patient states doing good. Monday she hurt her arm again. The pain is consistent. Carrying the boxes and took of the lid roughly and the arm started hurting again. Right elbow pain and right wrist pain. When she moves her wrist a certain way it does a cracking sound.   03/15/2024 Patient states   Relevant Historical Information: GERD  Additional pertinent review of systems negative.   Current Outpatient Medications:    baclofen  (LIORESAL ) 10 MG tablet, Take 1 tablet (10 mg total) by mouth 2 (two) times daily as needed for muscle spasms., Disp: 20 each, Rfl: 11   celecoxib  (CELEBREX ) 200 MG capsule, Take 1 capsule (200 mg total) by mouth 2 (two) times daily., Disp: 42 capsule, Rfl: 0   escitalopram  (LEXAPRO ) 10 MG tablet, Take 1 tablet (10 mg total) by mouth daily., Disp: 90 tablet, Rfl: 3   MAGNESIUM OXIDE, ANTACID, PO, Take 1 tablet by mouth in the morning, at noon, in the evening, and at bedtime., Disp: , Rfl:    methylPREDNISolone  (MEDROL  DOSEPAK) 4 MG TBPK tablet, Follow instructions on the package., Disp: 21 tablet, Rfl: 0   ondansetron  (ZOFRAN ) 4 MG tablet, Take 4 mg by mouth every 8 (eight) hours  as needed for nausea or vomiting., Disp: , Rfl:    Pediatric Multivitamins-Fl (MULTIVITAMIN + FLUORIDE) 0.25 MG CHEW, Chew 1 tablet by mouth daily., Disp: , Rfl:    rizatriptan  (MAXALT -MLT) 10 MG disintegrating tablet, Take 1 tablet (10 mg total) by mouth as needed for migraine. May repeat in 2 hours if needed, Disp: 12 tablet, Rfl: 11   Objective:     There were no vitals filed for this visit.    There is no height or weight on file to calculate BMI.    Physical Exam:    ***   Electronically signed by:  Desiree Krueger Krueger Desiree Krueger 7:39 AM 03/14/24

## 2024-03-15 ENCOUNTER — Ambulatory Visit: Admitting: Sports Medicine

## 2024-03-15 VITALS — HR 91 | Ht 62.0 in | Wt 145.0 lb

## 2024-03-15 DIAGNOSIS — M7712 Lateral epicondylitis, left elbow: Secondary | ICD-10-CM

## 2024-03-15 DIAGNOSIS — M25521 Pain in right elbow: Secondary | ICD-10-CM | POA: Diagnosis not present

## 2024-03-15 DIAGNOSIS — M25522 Pain in left elbow: Secondary | ICD-10-CM | POA: Diagnosis not present

## 2024-03-15 DIAGNOSIS — M7701 Medial epicondylitis, right elbow: Secondary | ICD-10-CM | POA: Diagnosis not present

## 2024-03-15 MED ORDER — CELECOXIB 200 MG PO CAPS: 200.0000 mg | ORAL_CAPSULE | Freq: Two times a day (BID) | ORAL | 0 refills | Status: DC

## 2024-03-15 NOTE — Patient Instructions (Signed)
-   Use Celebrex  200 mg daily as needed for pain.  Recommend limiting chronic NSAIDs to 1-2 doses per week to prevent long-term side effects.  Celebrex  refill   Tylenol  [phone] mg 2-3 times a day for pain relief     Voltaren gel over areas of pain   Continue wrist braces   Recommend buying tennis elbow brace   Continue HEP  Work note provided no lifting more than 15 pounds   3 month follow up

## 2024-04-11 ENCOUNTER — Other Ambulatory Visit: Payer: Self-pay | Admitting: Sports Medicine

## 2024-06-17 NOTE — Progress Notes (Signed)
 [facility] [address] [place] [address] Phone: [phone]   Assessment and Plan:     1. Left elbow pain (Primary) 2. Left lateral epicondylitis 3. Right elbow pain 4. Medial epicondylitis of elbow, right -Chronic with exacerbation, subsequent visit - Overall significant improvement in left lateral elbow pain and right medial elbow pain consistent with resolving lateral epicondylitis and medial epicondylitis with conservative therapy including HEP, Celebrex  course, Voltaren gel - Recommend continuing HEP - Continue topical Voltaren gel over areas of pain - Use Tylenol  500 to 1000 mg tablets 2-3 times a day for day-to-day pain relief   - Use Celebrex  200 mg daily as needed for breakthrough pain.  Recommend limiting chronic NSAIDs to 1-2 doses per week to prevent long-term side effects. Use Tylenol  500 to 1000 mg tablets 2-3 times a day as needed for day-to-day pain relief.    -Recommend no lifting restriction at work.  Still recommend using wrist brace during physical activity.  Work note provided -Patient mentions numbness and tingling at night along only third left digit.  Recommend discontinuing wrist brace as this may be compressing nerves at third MCP  Pertinent previous records reviewed include none   Follow Up: As needed if no improvement or worsening of symptoms.  Could consider needling versus ECSWT versus PRP versus repeating physical therapy versus repeating NSAID course   Subjective:   I, Desiree Krueger, am serving as a neurosurgeon for Doctor Morene Mace   Chief Complaint: left elbow pain    HPI:    08/31/2023 Patient is a 53 year old female with left elbow pain. Patient states she lifted something that was heavy the pain started at her forearm and radiated up tp her shoulder . 2 weeks ago. She started meloxicam  2 days ago and that has helped. Decreased ROM and grip strength, she has to brace her elbow with  her body to lift things. States that the pain in her elbow radiates down to her leg but the pain in her leg appeared months ago    09/21/2023 Patient states it hurts to put strain on it. It is getting better. When she is inactive there is no pain. When she does activity it hurts. There is a stabbing pain that radiates up her shoulder from her elbow.    10/12/2023 Patient states better but still has pain    12/08/2023 Patient states elbow pain started in January of this year. She has to do slow movements or there is a sharp pain in the elbow and pain in the forearm. Injured arm by picking up something heavy. Uses the arm throughout the day and by the end of the day she has to slowly relax that arm because it becomes very painful. Pain does not wake her up during the night but when she gets up in the morning the pain is there.    Duration? Since January Did you have an Injury to cause this pain?yes Taking Medication for pain? Voltaren  Numbness or Tingling? yes Does the pain Radiate? yes Altered gait or use? yes ROM/ impairment of movement? yes   01/18/2024 Patient states a little bit better but not a 100% . Has pain when she tries to work her normal pace it causes pain. She has a knot on her left  3rd digit    02/15/2024 Patient states doing good. Monday she hurt her arm again. The pain is consistent. Carrying the boxes and took of the  lid roughly and the arm started hurting again. Right elbow pain and right wrist pain. When she moves her wrist a certain way it does a cracking sound.    03/15/2024 Patient states pain is still the same. Completed the medicine. Lost her braces. She will buy new ones noticed left elbow pain when she wasn't wearing    06/24/2024 Patient states she is felling better    Relevant Historical Information: GERD  Additional pertinent review of systems negative.   Current Outpatient Medications:    baclofen  (LIORESAL ) 10 MG tablet, Take 1 tablet (10 mg total) by mouth  2 (two) times daily as needed for muscle spasms., Disp: 20 each, Rfl: 11   celecoxib  (CELEBREX ) 200 MG capsule, Take 1 capsule (200 mg total) by mouth 2 (two) times daily., Disp: 60 capsule, Rfl: 0   escitalopram  (LEXAPRO ) 10 MG tablet, Take 1 tablet (10 mg total) by mouth daily., Disp: 90 tablet, Rfl: 3   MAGNESIUM OXIDE, ANTACID, PO, Take 1 tablet by mouth in the morning, at noon, in the evening, and at bedtime., Disp: , Rfl:    methylPREDNISolone  (MEDROL  DOSEPAK) 4 MG TBPK tablet, Follow instructions on the package., Disp: 21 tablet, Rfl: 0   ondansetron  (ZOFRAN ) 4 MG tablet, Take 4 mg by mouth every 8 (eight) hours as needed for nausea or vomiting., Disp: , Rfl:    Pediatric Multivitamins-Fl (MULTIVITAMIN + FLUORIDE) 0.25 MG CHEW, Chew 1 tablet by mouth daily., Disp: , Rfl:    rizatriptan  (MAXALT -MLT) 10 MG disintegrating tablet, Take 1 tablet (10 mg total) by mouth as needed for migraine. May repeat in 2 hours if needed, Disp: 12 tablet, Rfl: 11   Objective:     Vitals:   06/24/24 1601  Pulse: 81  SpO2: 99%  Weight: 145 lb (65.8 kg)  Height: 5' 2 (1.575 m)      Body mass index is 26.52 kg/m.    Physical Exam:    General: Appears well, no acute distress, nontoxic and pleasant Neck: FROM, no pain Neuro: sensation is intact distally with no deficits, strenghth is 5/5 in elbow flexors/extenders/supinator/pronators and wrist flexors/extensors Psych: no evidence of anxiety or depression   Bilateral elbow: No deformity, swelling or muscle wasting Normal Carrying angle ROM:0-140, supination and pronation 90 TTP left lateral epicondyle, left supinator, right pronator, right medial epicondyle NTTP over triceps, ticeps tendon, olecronon,  antecubital fossa, biceps tendon,     Negative tinnels over cubital tunnel Right sided pain with resisted wrist and middle digit extension Left-sided pain with resisted wrist flexion Right sided pain with resisted supination Left-sided pain with  resisted pronation Negative valgus stress Negative varus stress Negative milking maneuver    Electronically signed by:  Odis Mace D.CLEMENTEEN [facility] 4:18 PM 06/24/24

## 2024-06-24 ENCOUNTER — Ambulatory Visit (INDEPENDENT_AMBULATORY_CARE_PROVIDER_SITE_OTHER): Admitting: Sports Medicine

## 2024-06-24 VITALS — HR 81 | Ht 62.0 in | Wt 145.0 lb

## 2024-06-24 DIAGNOSIS — M7701 Medial epicondylitis, right elbow: Secondary | ICD-10-CM

## 2024-06-24 DIAGNOSIS — M7712 Lateral epicondylitis, left elbow: Secondary | ICD-10-CM | POA: Diagnosis not present

## 2024-06-24 DIAGNOSIS — M25522 Pain in left elbow: Secondary | ICD-10-CM

## 2024-06-24 DIAGNOSIS — M25521 Pain in right elbow: Secondary | ICD-10-CM

## 2024-06-24 MED ORDER — CELECOXIB 200 MG PO CAPS: 200.0000 mg | ORAL_CAPSULE | Freq: Two times a day (BID) | ORAL | 0 refills | Status: AC

## 2024-06-24 NOTE — Patient Instructions (Signed)
 Work note provide no lifting restriction. But, she should be allowed to wear brace while working   - Use Celebrex  200 mg daily as needed for breakthrough pain.  Recommend limiting chronic NSAIDs to 1-2 doses per week to prevent long-term side effects. Use Tylenol  500 to 1000 mg tablets 2-3 times a day as needed for day-to-day pain relief.    Celebrex  refill   Voltaren gel over areas of pain   Stop using brace at night   As needed follow up

## 2024-07-01 ENCOUNTER — Encounter: Payer: Self-pay | Admitting: Adult Health

## 2024-07-01 ENCOUNTER — Ambulatory Visit: Admitting: Adult Health

## 2024-07-01 VITALS — BP 98/59 | HR 86 | Ht 62.0 in | Wt 148.0 lb

## 2024-07-01 DIAGNOSIS — G43709 Chronic migraine without aura, not intractable, without status migrainosus: Secondary | ICD-10-CM

## 2024-07-01 DIAGNOSIS — F32A Depression, unspecified: Secondary | ICD-10-CM

## 2024-07-01 DIAGNOSIS — F419 Anxiety disorder, unspecified: Secondary | ICD-10-CM

## 2024-07-01 MED ORDER — RIZATRIPTAN BENZOATE 10 MG PO TBDP: 10.0000 mg | ORAL_TABLET | ORAL | 11 refills | Status: AC | PRN

## 2024-07-01 NOTE — Progress Notes (Signed)
 [facility] [address] [place]. [place] [address] [phone]       OFFICE FOLLOW UP NOTE  [name] Date of Birth:  1970/08/29 Medical Record Number:  [id number]    Primary neurologist: Dr. Onita Reason for visit: Migraine follow-up    SUBJECTIVE:   CHIEF COMPLAINT:  Chief Complaint  Patient presents with   Follow-up    Pt in 3 with interpreter Pt here for Migraine f/u Pt states 3 migraines Pt states no questions or concerns for today's visit      HPI:   Update 07/01/2024 JM: Patient returns for follow-up visit accompanied by [facility] health interpreter.  At prior visit, noted approximately 2-4 migraines per month and continued on rizatriptan  for rescue, she declined interest in preventative medication.  She was also referred to PT for cervicalgia contributing to migraines.  Currently, migraines overall well-controlled.  She typically has about 3 migraines per month.  Usually triggered by increased stress/anxiety which can trigger a tension headache and at times concerning for migraine.  Use of rizatriptan  with benefit.  Noted improvement of cervicalgia after working with PT.  She is currently on Lexapro  for anxiety which she feels is beneficial but at times can have increased difficulty with anxiety.  She questions use of Neuroexan supplement for anxiety and sleep. She questions recommendations for psychiatrist.  She was having issues with left shoulder and elbow pain which has since greatly improved, PCP did provide work restrictions and she questions if this could be lifted. Previously assisted with FMLA for days off for migraine headaches as needed, previously completed 08/2022, no updated forms since that time.  No further questions or concerns at this time.     History provided for reference purposes only Update 11/27/2023 JM: Patient returns for follow-up visit accompanied by [facility] interpreter.   She reports about 2-4 migraines per  month.  She had a recent migraine that started on Thursday, 4/10 and lasted until 4/13.  Noted significant increase neck tightness associated with her migraine.  She does have prescription for tizanidine  but did not use as she feels no benefit.  She does report using rizatriptan  which typically helps abort migraine but did not seem to help with recent migraine.  She is mostly concerned about ongoing neck pain, reports stiffness/tightness when trying to look over her shoulder, denies any radiculopathy symptoms.  Currently working with PT for left elbow tendinitis.  She previously discontinued duloxetine  as this was causing drowsiness.  She is unsure if she tried to take in the evening as she generally does not remember taking.  She is currently on Lexapro  for depression/anxiety but does not feel her anxiety is well-managed, feels her mind wanders constantly and has difficulty focusing on 1 task at a time.  She plans on scheduling follow-up visit with PCP to further discuss.    Update 05/03/2023 JM: Patient returns for follow-up visit accompanied by [facility] interpreter.  Reports more recent significant increased stressors at work, having issues with managers and other co-workers and is being required to work longer hours. Due to this, migraines have been increasing, typically about 4x per week with intensity based on stress level at that time. Will use rizatriptan  usually with benefit, at times can cause dizziness and fatigue. Has been having a persist headache over the past 6 days. She is very tearful during visit today speaking of this. PCP started Lexapro  back in November, but due to increased stress at work recently, has been  having more anxiety and depression, has not yet had f/u with PCP since initiating (although 3 months f/u recommended). Patient is requesting paperwork to be completed requesting limitation of daily working hours.   Update 06/23/2022 JM: Patient returns for 47-month migraine follow-up  accompanied by interpreter.  Reports approximately 2 migraines over the past month but over the past weekend, more severe and lasted for a few days. She has been dealing with constipation which can worsen her migraine headaches. Will use Aleve with bad migraine, denies over use.  Will use Maxalt  occasionally if migraine persists after using Aleve, can cause some drowsiness and concerned that her body will get use to the medication and not be as effective if she uses it too much.  She is currently taking B6 supplement.  Discontinued venlafaxine  after prior visit due to side effects.  She does question restarting venlafaxine  as her anxiety/depression has been fully worsening.  She also complains of dizziness that can occur randomly, is not triggered by position or quick head movement. Can occur while just sitting, lasts very short duration. Admits to only drinking 1 bottle of water per day.  Did have BMP and TSH completed today by PCP.   Update 12/15/2021 JM: Patient returns for follow-up visit after prior visit 09/13/2021 with Dr. Onita.  She is accompanied by interpreter. Migraines have greatly improved since prior visit. Reports about 1 migraine per month over the past couple of months. She continues on effexor  75mg  daily.  She questions decreasing dosage  as she feels she has had increased anxiety and tremulousness since starting.  She is otherwise tolerating without side effects. Unable to start CRG P injection as previously recommended due to price. Reports starting butterbur and Vit B2 a couple weeks ago with further improvement of her migraines (has mild headache today but no migraine over the past 3 weeks). Will use rizatriptan  with benefit and occasionally will need to repeat rizatriptan  in addition to tizanidine . Use of Zofran  as needed.  She has questions regarding results of MRI that was completed 1 year ago. She does not further look into family history and has found extensive hx of migraines in her  family.  no further concerns at this time.   History copied from Dr. Georgianne prior OV note for reference purposes only Desiree Krueger is a 53 year old female, seen in request by her primary care physician Dr.   Purcell, [facility], for evaluation of frequent headaches, initial evaluation was on Dec 24, 2020     I reviewed and summarized the referring note. PMHx.   She reported a long history of chronic migraine headaches, started in her 30s, previous headache was very severe, over the years, she was able to identify triggers, weather change, stress, strong smells, bright light, excessive coffee intake, spicy food, cheese, red wine   She has made major changes in her diet, with some improvement of her headache, but she still has 4 headaches days in a week, she described lateralized, sometimes holoacranial pressure headaches, evolving to severe pounding headache with light noise smell sensitivity, lasting for hours   For a while, she was using frequent Excedrin Migraine almost daily basis, in February 2022, she was giving prescription of Imitrex  50 mg as needed, she used up her monthly supply of 9 tablets, sometimes has to buy extra, and laid back on her over-the-counter medication   She never tried preventive medication in the past, her brother also suffered migraine   She denies visual loss, father passed  away with Parkinson's disease, mother suffered dementia   With her frequent headaches, also complains of emotional outburst, her mood switch quickly'   UPDATE Sep 13 2021: She is no longer taking Nortriptyline , up to 20mg  qhs, did not help her headache much, cause constipation, Maxalt  prn was helpful, up to 2 tabs/one day up to 4 days a week, her headache usually improve after 1 hour, she is also taking magnesium and riboflavin    She continues to complains of mood change, depression, anxiety, in tear today. She has to stay in dark all the time.   Personally reviewed MRI of the  brain without contrast in May 2022: Mild small vessel disease, no acute abnormality       ROS:   14 system review of systems performed and negative with exception of this in HPI  PMH:  Past Medical History:  Diagnosis Date   Anemia    Constipation    Depression    Dysrhythmia    GERD (gastroesophageal reflux disease)    Headache    MIGRAINES   Low blood pressure    Lower back pain    Thyroid  disease    Varicose vein of leg    BILATERAL    PSH:  Past Surgical History:  Procedure Laterality Date   DILATATION & CURETTAGE/HYSTEROSCOPY WITH MYOSURE N/A 01/31/2017   Procedure: DILATATION & CURETTAGE/HYSTEROSCOPY WITH MYOSURE;  Surgeon: Winfred Curlee DEL, MD;  Location: [facility];  Service: Gynecology;  Laterality: N/A;   DILATATION & CURETTAGE/HYSTEROSCOPY WITH MYOSURE N/A 09/29/2017   Procedure: DILATATION & CURETTAGE/HYSTEROSCOPY WITH MYOSURE RESECTION OF SUBMUCOSAL MYOMA;  Surgeon: Lavoie, Marie-Lyne, MD;  Location: [facility];  Service: Gynecology;  Laterality: N/A;  request to follow 2nd case around 10am in [place] Gyn block time Requests one hour OR time   HYSTEROSCOPY WITH NOVASURE N/A 09/29/2017   Procedure: HYSTEROSCOPY WITH NOVASURE ENDOMETRIAL ABLATION;  Surgeon: Lavoie, Marie-Lyne, MD;  Location: [facility];  Service: Gynecology;  Laterality: N/A;   TUBAL LIGATION      Social History:  Social History   Socioeconomic History   Marital status: Single    Spouse name: Not on file   Number of children: 2   Years of education: two years college   Highest education level: Bachelor's degree (e.g., BA, AB, BS)  Occupational History   Occupation: warehouse  Tobacco Use   Smoking status: Former    Types: Cigarettes   Smokeless tobacco: Never  Vaping Use   Vaping status: Never Used  Substance and Sexual Activity   Alcohol use: Yes    Comment: occ   Drug use: Never   Sexual activity: Yes    Partners: Male    Birth  control/protection: Post-menopausal, Surgical    Comment: BTL  Other Topics Concern   Not on file  Social History Narrative   Lives at home with her daughter.   Right-handed.   Caffeine use: 3 cups per day.   Social Drivers of Health   Financial Resource Strain: Medium Risk (08/29/2023)   Overall Financial Resource Strain (CARDIA)    Difficulty of Paying Living Expenses: Somewhat hard  Food Insecurity: Food Insecurity Present (08/29/2023)   Hunger Vital Sign    Worried About Running Out of Food in the Last Year: Sometimes true    Ran Out of Food in the Last Year: Patient declined  Transportation Needs: No Transportation Needs (08/29/2023)   PRAPARE - Administrator, Civil Service (Medical): No  Lack of Transportation (Non-Medical): No  Physical Activity: Insufficiently Active (08/29/2023)   Exercise Vital Sign    Days of Exercise per Week: 1 day    Minutes of Exercise per Session: 20 min  Stress: No Stress Concern Present (08/29/2023)   [employer] of Occupational Health - Occupational Stress Questionnaire    Feeling of Stress : Only a little  Social Connections: Socially Isolated (08/29/2023)   Social Connection and Isolation Panel    Frequency of Communication with Friends and Family: Three times a week    Frequency of Social Gatherings with Friends and Family: Never    Attends Religious Services: Never    Database Administrator or Organizations: No    Attends Engineer, Structural: Not on file    Marital Status: Never married  Catering Manager Violence: Not on file    Family History:  Family History  Problem Relation Age of Onset   Varicose Veins Mother    Dementia Mother    Parkinson's disease Father    Cancer Maternal Uncle        COLON   Migraines Cousin     Medications:   Current Outpatient Medications on File Prior to Visit  Medication Sig Dispense Refill   celecoxib  (CELEBREX ) 200 MG capsule Take 1 capsule (200 mg total) by mouth 2  (two) times daily. 60 capsule 0   escitalopram  (LEXAPRO ) 10 MG tablet Take 1 tablet (10 mg total) by mouth daily. 90 tablet 3   MAGNESIUM OXIDE, ANTACID, PO Take 1 tablet by mouth in the morning, at noon, in the evening, and at bedtime.     ondansetron  (ZOFRAN ) 4 MG tablet Take 4 mg by mouth every 8 (eight) hours as needed for nausea or vomiting.     Pediatric Multivitamins-Fl (MULTIVITAMIN + FLUORIDE) 0.25 MG CHEW Chew 1 tablet by mouth daily.     rizatriptan  (MAXALT -MLT) 10 MG disintegrating tablet Take 1 tablet (10 mg total) by mouth as needed for migraine. May repeat in 2 hours if needed 12 tablet 11   baclofen  (LIORESAL ) 10 MG tablet Take 1 tablet (10 mg total) by mouth 2 (two) times daily as needed for muscle spasms. 20 each 11   methylPREDNISolone  (MEDROL  DOSEPAK) 4 MG TBPK tablet Follow instructions on the package. 21 tablet 0   No current facility-administered medications on file prior to visit.    Allergies:  No Known Allergies    OBJECTIVE:  Physical Exam  Vitals:   07/01/24 1410  BP: (!) 98/59  Pulse: 86  Weight: 148 lb (67.1 kg)  Height: 5' 2 (1.575 m)     Body mass index is 27.07 kg/m. No results found.  General: well developed, well nourished, very pleasant middle-age female, seated, tearful during visit  Neurologic Exam Mental Status: Awake and fully alert.  Limited English but unable to appreciate aphasia or dysarthria.  Oriented to place and time. Recent and remote memory intact. Attention span, concentration and fund of knowledge appropriate. Mood and affect appropriate.  Cranial Nerves:  Pupils equal, briskly reactive to light. Extraocular movements full without nystagmus. Visual fields full to confrontation. Hearing intact. Facial sensation intact. Face, tongue, palate moves normally and symmetrically.  Motor: Normal bulk and tone. Normal strength in all tested extremity muscles Sensory.: intact to touch , pinprick , position and vibratory sensation.   Coordination: Rapid alternating movements normal in all extremities. Finger-to-nose and heel-to-shin performed accurately bilaterally. Gait and Station: Arises from chair without difficulty. Stance is normal. Gait demonstrates  normal stride length and balance without use of AD. Tandem walk and heel toe without difficulty.          ASSESSMENT: Desiree Krueger is a 53 y.o. year old female chronic migraine headaches.      PLAN:  Chronic migraine headaches:  -Overall well-controlled, about 3 per month. Typically triggers by anxiety/stress -Rescue - continue rizatriptan  as needed. Can repeat x1 if needed.  -Declines interest in preventative therapy at this time -advised to f/u with PCP regarding lifting work restrictions on [employer]. No restrictions in regards to her migraine headaches. If updated FMLA needed for migraine reasons, she is aware to have HR send forms to be updated.   Tried/failed: Nortriptyline , venlafaxine , duloxetine , rizatriptan , Zofran , tizanidine , sumatriptan .   Unable to afford CRGP Would not recommend beta-blockers due to already low blood pressure  2.  Depression/anxiety -greatly improved but struggles on occasion -discussed trying OTC supplement for anxiety but if symptoms persist, advised to discuss other PRN options with PCP -encouraged looking into different psychiatrist/psychologist for further assist     Follow up in 1 year via MyChart video visit or call earlier if needed    CC:  PCP: Purcell Emil Schanz, MD    I personally spent a total of 25 minutes in the care of the patient today including preparing to see the patient, performing a medically appropriate exam/evaluation, counseling and educating, placing orders, and documenting clinical information in the EHR.   Harlene Bogaert, AGNP-BC  [facility] [address] [place], [place] [address]  Phone [phone] Fax [phone] Note: This document was  prepared with digital dictation and possible smart phrase technology. Any transcriptional errors that result from this process are unintentional.

## 2024-07-01 NOTE — Patient Instructions (Addendum)
 Your Plan:  Continue Maxalt  as needed for migraine headache  You can try supplement to help with anxiety and sleep - if this persists or worsens, can discuss other as needed options with your PCP      Follow up in 1 year or call earlier if needed - this will be via MyChart video visit. If anything should worsen or you want to discuss medication changes, please call so we can make this a longer visit.       Thank you for coming to see us  at [facility]. I hope we have been able to provide you high quality care today.  You may receive a patient satisfaction survey over the next few weeks. We would appreciate your feedback and comments so that we may continue to improve ourselves and the health of our patients.

## 2024-07-30 ENCOUNTER — Other Ambulatory Visit: Payer: Self-pay | Admitting: Emergency Medicine

## 2024-07-30 DIAGNOSIS — F341 Dysthymic disorder: Secondary | ICD-10-CM

## 2024-08-09 ENCOUNTER — Telehealth: Payer: Self-pay

## 2024-08-09 NOTE — Telephone Encounter (Signed)
 Copied from CRM [id number]. Topic: General - Other >> Aug 09, 2024  2:40 PM Chasity T wrote: Reason for CRM: Patient is calling is requesting a bio metric form to be filled out by PCP no later than Dec 31st. She is going to send form by Mychart to him. I will advise her of the turn around time for all document completions as well.

## 2024-08-13 NOTE — Telephone Encounter (Signed)
 No form has been uploaded nor has it been received

## 2024-08-27 ENCOUNTER — Ambulatory Visit: Payer: Self-pay

## 2024-08-27 NOTE — Telephone Encounter (Signed)
 FYI Only or Action Required?: FYI only for provider: appointment scheduled on 08/28/24.  Patient was last seen in primary care on 10/27/2023 by Alvia Corean CROME, FNP.  Called Nurse Triage reporting Influenza.  Symptoms began several days ago.  Interventions attempted: OTC medications: Advil  Plus-cough and congestion and Rest, hydration, or home remedies.  Symptoms are: gradually worsening.  Triage Disposition: Home Care  Patient/caregiver understands and will follow disposition?: Yes   Reason for Disposition  [1] Probable influenza (fever) with no complications AND [2] NOT HIGH RISK  Answer Assessment - Initial Assessment Questions Patient daughter Marylynn calling in for patient, patient is spanish speaking, offered to have translator join line but patient stated no. Requesting appointment with pcp only for 4 days of flu like symptoms, productive cough of light green, congestion, mild headache, tactile fever.   1. SYMPTOMS: What is your main symptom or concern? (e.g., cough, fever, shortness of breath, muscle aches)      Cough, headache  2. ONSET: When did the symptoms start?      08/23/24 3. COUGH: Do you have a cough? If Yes, ask: How bad is the cough?       Light green mucous intermittently 4. FEVER: Do you have a fever? If Yes, ask: What is your temperature, how was it measured, and when did it start?     Feels feverish but not measured 5. BREATHING DIFFICULTY: Are you having any difficulty breathing? (e.g., normal; shortness of breath, wheezing, unable to speak)      Shortness of breath intermittently but only during cough  6. BETTER-SAME-WORSE: Are you getting better, staying the same or getting worse compared to yesterday?  If getting worse, ask, In what way?     Worse  7. OTHER SYMPTOMS: Do you have any other symptoms?  (e.g., chills, fatigue, headache, loss of smell or taste, muscle pain, sore throat)     Chest pain only during cough 8. INFLUENZA  EXPOSURE: Was there any known exposure to influenza (flu) before the symptoms began?      unsure 9. INFLUENZA SUSPECTED: Why do you think you have influenza? (e.g., positive flu self-test at home, symptoms after exposure).     symptoms  Protocols used: Influenza (Flu) Suspected-A-AH  Copied from CRM [id number]. Topic: Clinical - Red Word Triage >> Aug 27, 2024 10:15 AM Amber H wrote: [facility] that prompted transfer to Nurse Triage: Patient c/o chest pain, severe coughing, body aches, headaches, coughing up light green mucous, did not check temperature but thinks she has a fever.

## 2024-08-28 ENCOUNTER — Encounter: Payer: Self-pay | Admitting: Emergency Medicine

## 2024-08-28 ENCOUNTER — Telehealth: Payer: Self-pay | Admitting: Emergency Medicine

## 2024-08-28 ENCOUNTER — Ambulatory Visit: Admitting: Emergency Medicine

## 2024-08-28 VITALS — BP 122/80 | HR 107 | Temp 98.2°F | Ht 62.0 in | Wt 148.0 lb

## 2024-08-28 DIAGNOSIS — J22 Unspecified acute lower respiratory infection: Secondary | ICD-10-CM | POA: Diagnosis not present

## 2024-08-28 DIAGNOSIS — R6889 Other general symptoms and signs: Secondary | ICD-10-CM | POA: Diagnosis not present

## 2024-08-28 DIAGNOSIS — R051 Acute cough: Secondary | ICD-10-CM | POA: Diagnosis not present

## 2024-08-28 LAB — POCT INFLUENZA A/B
Influenza A, POC: NEGATIVE
Influenza B, POC: NEGATIVE

## 2024-08-28 LAB — POC COVID19 BINAXNOW: SARS Coronavirus 2 Ag: NEGATIVE

## 2024-08-28 MED ORDER — BENZONATATE 200 MG PO CAPS: 200.0000 mg | ORAL_CAPSULE | Freq: Two times a day (BID) | ORAL | 0 refills | Status: AC | PRN

## 2024-08-28 MED ORDER — AZITHROMYCIN 250 MG PO TABS: ORAL_TABLET | ORAL | 0 refills | Status: AC

## 2024-08-28 MED ORDER — HYDROCODONE BIT-HOMATROP MBR 5-1.5 MG/5ML PO SOLN: 5.0000 mL | Freq: Every evening | ORAL | 0 refills | Status: AC | PRN

## 2024-08-28 NOTE — Assessment & Plan Note (Signed)
 Symptom management discussed Advised to rest and stay well-hydrated Tylenol  and/or Advil  as needed Advised to contact the office if no better or worse during the next several days.

## 2024-08-28 NOTE — Patient Instructions (Signed)
Bronquitis aguda en los adultos Acute Bronchitis, Adult  La bronquitis aguda es la inflamacin repentina de las vas areas (bronquios) de los pulmones. Esta afeccin puede dificultar la respiracin. En los adultos, la bronquitis aguda generalmente desaparece en 2 semanas. La tos provocada por la bronquitis puede durar hasta 3 semanas. El hbito de fumar, las Environmental consultant y el asma pueden empeorar esta afeccin. Cules son las causas? Los microbios que causan el resfro y la gripe (virus). La causa ms frecuente de esta afeccin es el virus que provoca el resfro comn. Bacterias. Sustancias que molestan (irritan) los pulmones, lo que incluye: Humo de cigarrillos y otros productos de tabaco. Polvo y polen. Vapores de productos qumicos, gases o combustible quemado. Contaminacin del aire interior o exterior. Qu incrementa el riesgo? El sistema de defensa del cuerpo debilitado. Este tambin se denomina sistema inmunitario. Cualquier afeccin que afecte a los pulmones y la respiracin, como el asma. Cules son los signos o sntomas? Tos. Despedir Neomia Dear mucosidad transparente, amarilla o verde al toser. Emitir sonidos de silbidos agudos al respirar, ms a menudo al exhalar (sibilancias). Secrecin o congestin nasal. Exceso de mucosidad en los pulmones (congestin torcica). Falta de aire. Dolores [employer] cuerpo. Dolor de Advertising copywriter. Cmo se trata? La bronquitis aguda puede desaparecer con [employer], sin tratamiento. Su mdico puede recomendarle lo siguiente: Beba ms lquidos. Esto ayudar a diluir la mucosidad de modo que sea ms fcil expectorarla. Usar un dispositivo que Education administrator en los pulmones (inhalador). Utilizar un humidificador o vaporizador. Estas son mquinas que agregan agua al aire. Esto ayuda con la tos y con la respiracin deficiente. Tomar un medicamento que diluya la mucosidad y ayude a eliminarla de los pulmones. Tomar un medicamento que prevenga o detenga la  tos. No es frecuente tomar un antibitico para esta afeccin. Siga estas indicaciones en su casa:  Use los medicamentos de venta libre y los recetados solamente como se lo haya indicado el mdico. Use un Armed forces operational officer, un humidificador o un vaporizador tal como se lo haya indicado el mdico. [employer] cucharaditas (10 ml) de miel a la hora de [place]. Esto ayuda a disminuir la tos por la noche. Beba suficiente lquido para Radio producer pis (la orina) de color amarillo plido. No fume ni consuma ningn producto que contenga nicotina o tabaco. Si necesita ayuda para dejar de fumar, consulte al mdico. Descanse lo suficiente. Regrese a sus actividades normales cuando el [employer] diga que es [place]. Concurra a todas las visitas de seguimiento. Cmo se previene?  Lvese las manos frecuentemente con agua y jabn durante al menos 20 segundos. Use un desinfectante para manos si no dispone de [place] y [place]. Evite el contacto con personas que tienen sntomas de resfro. Trate de no llevarse las manos a la boca, la nariz o los ojos. Evite inhalar humo o vapores qumicos. Recuerde aplicarse la vacuna contra la gripe todos los [place]. Comunquese con un mdico si: Los sntomas no mejoran en el trmino de 2 semanas. Tiene dificultad para expulsar la mucosidad al toser. La tos lo mantiene despierto por la noche. Tiene fiebre. Solicite ayuda de inmediato si: Tose y [employer]. Siente dolor en el pecho. Sufre un episodio muy intenso de falta de aire. Se desmaya o se siente como si se fuera a desmayar. Tiene un dolor de cabeza muy intenso. La fiebre o los escalofros empeoran. Estos sntomas pueden Customer service manager. Solicite ayuda de inmediato. Comunquese con el servicio de emergencias de su localidad (911 en los  [place]). No espere a ver si los sntomas desaparecen. No conduzca por sus propios medios [employer]. Resumen La bronquitis aguda es la inflamacin repentina de las vas  areas (bronquios) de los pulmones. En los adultos, la bronquitis aguda generalmente desaparece en 2 semanas. Beba ms lquidos. Esto ayudar a diluir la mucosidad de modo que sea ms fcil expectorarla. Use los medicamentos de venta libre y los recetados solamente como se lo haya indicado el mdico. Comunquese con un mdico si los sntomas no mejoran despus de 2 semanas de [place]. Esta informacin no tiene Theme park manager el consejo del mdico. Asegrese de hacerle al mdico cualquier pregunta que tenga. Document Revised: 12/21/2020 Document Reviewed: 12/21/2020 Elsevier Patient Education  2024 [employer].

## 2024-08-28 NOTE — Assessment & Plan Note (Signed)
 Upper viral infection with secondary bacterial infection Negative for COVID and flu Recommend daily azithromycin  for 5 days Advised to rest and stay well-hydrated Symptom management discussed Advised to contact the office if no better or worse during the next several days

## 2024-08-28 NOTE — Telephone Encounter (Signed)
 Patient dropped off document FMLA, to be filled out by provider. Patient requested to send it back via Call Patient to pick up within 7-days. Document is located in providers tray at front office.Please advise at Mobile [phone] (mobile)

## 2024-08-28 NOTE — Assessment & Plan Note (Signed)
 Cough management discussed Recommend over-the-counter Mucinex DM Tessalon  200 mg 3 times daily as needed Hycodan syrup at nighttime Commend to rest and stay well hydrated

## 2024-08-28 NOTE — Progress Notes (Signed)
 Levetta Bognar 54 y.o.   Chief Complaint  Patient presents with   URI    *Flu like symptoms-Cough, congestion, headache Pt states that her symptoms started this week, it also hurts to cough it has been causing chest pains    HISTORY OF PRESENT ILLNESS: Acute problem visit today. This is a 54 y.o. female complaining of flulike symptoms that started last Friday Mostly complaining of chest congestion, headache, and cough. No other associated symptoms No other complaints or medical concerns today.  URI  Associated symptoms include congestion, coughing and headaches. Pertinent negatives include no abdominal pain, chest pain, diarrhea, dysuria, nausea, rash, sore throat or vomiting.     Prior to Admission medications  Medication Sig Start Date End Date Taking? Authorizing Provider  celecoxib  (CELEBREX ) 200 MG capsule Take 1 capsule (200 mg total) by mouth 2 (two) times daily. 06/24/24  Yes Leonce Katz, DO  escitalopram  (LEXAPRO ) 10 MG tablet TAKE 1 TABLET BY MOUTH EVERY DAY 07/30/24  Yes Ferris Tally, Emil Schanz, MD  MAGNESIUM OXIDE, ANTACID, PO Take 1 tablet by mouth in the morning, at noon, in the evening, and at bedtime.   Yes [provider]  ondansetron  (ZOFRAN ) 4 MG tablet Take 4 mg by mouth every 8 (eight) hours as needed for nausea or vomiting.   Yes [provider]  Pediatric Multivitamins-Fl (MULTIVITAMIN + FLUORIDE) 0.25 MG CHEW Chew 1 tablet by mouth daily.   Yes [provider]  rizatriptan  (MAXALT -MLT) 10 MG disintegrating tablet Take 1 tablet (10 mg total) by mouth as needed for migraine. May repeat in 2 hours if needed 07/01/24  Yes Whitfield Raisin, NP    Allergies[1]  Patient Active Problem List   Diagnosis Date Noted   Left elbow tendinitis 08/29/2023   Left lateral epicondylitis 08/29/2023   E. coli UTI 07/03/2023   Recent urinary tract infection 06/13/2023   Pyelonephritis 05/31/2023   Dysthymia 07/05/2022   Chronic  idiopathic constipation 06/23/2022   Intractable chronic migraine without aura and without status migrainosus 08/18/2021   Postmenopausal 08/18/2021   Chronic bilateral low back pain without sciatica 02/10/2021   Chronic migraine w/o aura w/o status migrainosus, not intractable 12/24/2020   Headache, new daily persistent ([facility]) 12/24/2020   Iron deficiency 08/24/2017   Elevated liver function tests 03/16/2017   Gastroesophageal reflux disease without esophagitis 01/11/2017   High risk medication use 01/11/2017   History of hyperthyroidism 01/11/2017   Hyperthyroidism 01/11/2017   Anemia due to chronic blood loss 03/16/2016   Submucous leiomyoma of uterus 03/16/2016    Past Medical History:  Diagnosis Date   Anemia    Constipation    Depression    Dysrhythmia    GERD (gastroesophageal reflux disease)    Headache    MIGRAINES   Low blood pressure    Lower back pain    Thyroid  disease    Varicose vein of leg    BILATERAL    Past Surgical History:  Procedure Laterality Date   DILATATION & CURETTAGE/HYSTEROSCOPY WITH MYOSURE N/A 01/31/2017   Procedure: DILATATION & CURETTAGE/HYSTEROSCOPY WITH MYOSURE;  Surgeon: Winfred Curlee DEL, MD;  Location: [facility];  Service: Gynecology;  Laterality: N/A;   DILATATION & CURETTAGE/HYSTEROSCOPY WITH MYOSURE N/A 09/29/2017   Procedure: DILATATION & CURETTAGE/HYSTEROSCOPY WITH MYOSURE RESECTION OF SUBMUCOSAL MYOMA;  Surgeon: Lavoie, Marie-Lyne, MD;  Location: [facility] [place];  Service: Gynecology;  Laterality: N/A;  request to follow 2nd case around 10am in [place] Gyn block time Requests one hour OR time  HYSTEROSCOPY WITH NOVASURE N/A 09/29/2017   Procedure: HYSTEROSCOPY WITH NOVASURE ENDOMETRIAL ABLATION;  Surgeon: Lavoie, Marie-Lyne, MD;  Location: [facility] [place];  Service: Gynecology;  Laterality: N/A;   TUBAL LIGATION      Social History   Socioeconomic History   Marital status: Single    Spouse name: Not on  file   Number of children: 2   Years of education: two years college   Highest education level: Bachelor's degree (e.g., BA, AB, BS)  Occupational History   Occupation: warehouse  Tobacco Use   Smoking status: Former    Types: Cigarettes   Smokeless tobacco: Never  Vaping Use   Vaping status: Never Used  Substance and Sexual Activity   Alcohol use: Yes    Comment: occ   Drug use: Never   Sexual activity: Yes    Partners: Male    Birth control/protection: Post-menopausal, Surgical    Comment: BTL  Other Topics Concern   Not on file  Social History Narrative   Lives at home with her daughter.   Right-handed.   Caffeine use: 3 cups per day.   Social Drivers of Health   Tobacco Use: Medium Risk (08/28/2024)   Patient History    Smoking Tobacco Use: Former    Smokeless Tobacco Use: Never    Passive Exposure: Not on file  Financial Resource Strain: Medium Risk (08/29/2023)   Overall Financial Resource Strain (CARDIA)    Difficulty of Paying Living Expenses: Somewhat hard  Food Insecurity: Food Insecurity Present (08/29/2023)   Hunger Vital Sign    Worried About Running Out of Food in the Last Year: Sometimes true    Ran Out of Food in the Last Year: Patient declined  Transportation Needs: No Transportation Needs (08/29/2023)   PRAPARE - Administrator, Civil Service (Medical): No    Lack of Transportation (Non-Medical): No  Physical Activity: Insufficiently Active (08/29/2023)   Exercise Vital Sign    Days of Exercise per Week: 1 day    Minutes of Exercise per Session: 20 min  Stress: No Stress Concern Present (08/29/2023)   [employer] of Occupational Health - Occupational Stress Questionnaire    Feeling of Stress : Only a little  Social Connections: Socially Isolated (08/29/2023)   Social Connection and Isolation Panel    Frequency of Communication with Friends and Family: Three times a week    Frequency of Social Gatherings with Friends and Family: Never     Attends Religious Services: Never    Database Administrator or Organizations: No    Attends Engineer, Structural: Not on file    Marital Status: Never married  Intimate Partner Violence: Not on file  Depression (PHQ2-9): Low Risk (08/29/2023)   Depression (PHQ2-9)    PHQ-2 Score: 0  Alcohol Screen: Low Risk (08/29/2023)   Alcohol Screen    Last Alcohol Screening Score (AUDIT): 1  Housing: Low Risk (08/29/2023)   Housing Stability Vital Sign    Unable to Pay for Housing in the Last Year: No    Number of Times Moved in the Last Year: 0    Homeless in the Last Year: No  Utilities: Not on file  Health Literacy: Not on file    Family History  Problem Relation Age of Onset   Varicose Veins Mother    Dementia Mother    Parkinson's disease Father    Cancer Maternal Uncle        COLON   Migraines Cousin  Review of Systems  Constitutional: Negative.  Negative for chills and fever.  HENT:  Positive for congestion. Negative for sore throat.   Respiratory:  Positive for cough. Negative for sputum production and shortness of breath.   Cardiovascular: Negative.  Negative for chest pain and palpitations.  Gastrointestinal:  Negative for abdominal pain, diarrhea, nausea and vomiting.  Genitourinary: Negative.  Negative for dysuria and hematuria.  Skin: Negative.  Negative for rash.  Neurological:  Positive for headaches.  All other systems reviewed and are negative.   Today's Vitals   08/28/24 0851  BP: 122/80  Pulse: (!) 107  Temp: 98.2 F (36.8 C)  TempSrc: Oral  SpO2: 98%  Weight: 148 lb (67.1 kg)  Height: 5' 2 (1.575 m)   Body mass index is 27.07 kg/m.   Physical Exam Vitals reviewed.  Constitutional:      Appearance: Normal appearance.  HENT:     Head: Normocephalic.     Nose: Congestion present.  Eyes:     Extraocular Movements: Extraocular movements intact.     Conjunctiva/sclera: Conjunctivae normal.     Pupils: Pupils are equal, round, and  reactive to light.  Cardiovascular:     Rate and Rhythm: Normal rate and regular rhythm.     Pulses: Normal pulses.     Heart sounds: Normal heart sounds.  Pulmonary:     Effort: Pulmonary effort is normal.     Breath sounds: Normal breath sounds.  Abdominal:     Palpations: Abdomen is soft.  Skin:    General: Skin is warm and dry.  Neurological:     Mental Status: She is alert and oriented to person, place, and time.  Psychiatric:        Mood and Affect: Mood normal.        Behavior: Behavior normal.    Results for orders placed or performed in visit on 08/28/24 (from the past 24 hours)  POCT Influenza A/B     Status: Normal   Collection Time: 08/28/24  9:08 AM  Result Value Ref Range   Influenza A, POC Negative Negative   Influenza B, POC Negative Negative  POC COVID-19     Status: Normal   Collection Time: 08/28/24  9:09 AM  Result Value Ref Range   SARS Coronavirus 2 Ag Negative Negative     ASSESSMENT & PLAN: Problem List Items Addressed This Visit       Respiratory   Lower respiratory infection - Primary   Upper viral infection with secondary bacterial infection Negative for COVID and flu Recommend daily azithromycin  for 5 days Advised to rest and stay well-hydrated Symptom management discussed Advised to contact the office if no better or worse during the next several days      Relevant Medications   azithromycin  (ZITHROMAX ) 250 MG tablet     Other   Flu-like symptoms   Symptom management discussed Advised to rest and stay well-hydrated Tylenol  and or Advil  as needed Advised to contact the office if no better or worse during the next several days.      Relevant Orders   POC COVID-19 (Completed)   POCT Influenza A/B (Completed)   Acute cough   Cough management discussed Recommend over-the-counter Mucinex DM Tessalon  200 mg 3 times daily as needed Hycodan syrup at nighttime Commend to rest and stay well hydrated      Relevant Medications    benzonatate  (TESSALON ) 200 MG capsule   HYDROcodone  bit-homatropine (HYCODAN) 5-1.5 MG/5ML syrup   Patient Instructions  Bronquitis aguda en los adultos Acute Bronchitis, Adult  La bronquitis aguda es la inflamacin repentina de las vas areas (bronquios) de los pulmones. Esta afeccin puede dificultar la respiracin. En los adultos, la bronquitis aguda generalmente desaparece en 2 semanas. La tos provocada por la bronquitis puede durar hasta 3 semanas. El hbito de fumar, las environmental consultant y el asma pueden empeorar esta afeccin. Cules son las causas? Los microbios que causan el resfro y la gripe (virus). La causa ms frecuente de esta afeccin es el virus que provoca el resfro comn. Bacterias. Sustancias que molestan (irritan) los pulmones, lo que incluye: Humo de cigarrillos y otros productos de tabaco. Polvo y polen. Vapores de productos qumicos, gases o combustible quemado. Contaminacin del aire interior o exterior. Qu incrementa el riesgo? El sistema de defensa del cuerpo debilitado. Este tambin se denomina sistema inmunitario. Cualquier afeccin que afecte a los pulmones y la respiracin, como el asma. Cules son los signos o sntomas? Tos. Despedir ignacia mucosidad transparente, amarilla o verde al toser. Emitir sonidos de silbidos agudos al respirar, ms a menudo al exhalar (sibilancias). Secrecin o congestin nasal. Exceso de mucosidad en los pulmones (congestin torcica). Falta de aire. Dolores pepsico cuerpo. Dolor de advertising copywriter. Cmo se trata? La bronquitis aguda puede desaparecer con allied waste industries, sin tratamiento. Su mdico puede recomendarle lo siguiente: Beba ms lquidos. Esto ayudar a diluir la mucosidad de modo que sea ms fcil expectorarla. Usar un dispositivo que education administrator en los pulmones (inhalador). Utilizar un humidificador o vaporizador. Estas son mquinas que agregan agua al aire. Esto ayuda con la tos y con la respiracin deficiente. Tomar un  medicamento que diluya la mucosidad y ayude a eliminarla de los pulmones. Tomar un medicamento que prevenga o detenga la tos. No es frecuente tomar un antibitico para esta afeccin. Siga estas indicaciones en su casa:  Use los medicamentos de venta libre y los recetados solamente como se lo haya indicado el mdico. Use un armed forces operational officer, un humidificador o un vaporizador tal como se lo haya indicado el mdico. [employer] services [employer] cucharaditas (10 ml) de miel a la hora de [place]. Esto ayuda a disminuir la tos por la noche. Beba suficiente lquido para radio producer pis (la orina) de color amarillo plido. No fume ni consuma ningn producto que contenga nicotina o tabaco. Si necesita ayuda para dejar de fumar, consulte al mdico. Descanse lo suficiente. Regrese a sus actividades normales cuando el office [employer]ot diga que es [place]. Concurra a todas las visitas de seguimiento. Cmo se previene?  Lvese las manos frecuentemente con agua y jabn durante al menos 20 segundos. Use un desinfectante para manos si no dispone de agua y jabn. Evite el contacto con personas que tienen sntomas de resfro. Trate de no llevarse las manos a la boca, la nariz o los ojos. Evite inhalar humo o vapores qumicos. Recuerde aplicarse la vacuna contra la gripe todos los [place]. Comunquese con un mdico si: Los sntomas no mejoran en el trmino de 2 semanas. Tiene dificultad para expulsar la mucosidad al toser. La tos lo mantiene despierto por la noche. Tiene fiebre. Solicite ayuda de inmediato si: Tose y commercial metals company. Siente dolor en el pecho. Sufre un episodio muy intenso de falta de aire. Se desmaya o se siente como si se fuera a desmayar. Tiene un dolor de cabeza muy intenso. La fiebre o los escalofros empeoran. Estos sntomas pueden customer service manager. Solicite ayuda de inmediato. Comunquese con el servicio de emergencias de su localidad (911 en los  [place]). No espere a ver si los sntomas desaparecen. No  conduzca por sus propios medios [employer] incorporated. Resumen La bronquitis aguda es la inflamacin repentina de las vas areas (bronquios) de los pulmones. En los adultos, la bronquitis aguda generalmente desaparece en 2 semanas. Beba ms lquidos. Esto ayudar a diluir la mucosidad de modo que sea ms fcil expectorarla. Use los medicamentos de venta libre y los recetados solamente como se lo haya indicado el mdico. Comunquese con un mdico si los sntomas no mejoran despus de 2 semanas de [place]. Esta informacin no tiene theme park manager el consejo del mdico. Asegrese de hacerle al mdico cualquier pregunta que tenga. Document Revised: 12/21/2020 Document Reviewed: 12/21/2020 Elsevier Patient Education  2024 Elsevier Inc.    Emil Schaumann, MD  [facility]    [1] No Known Allergies

## 2024-08-28 NOTE — Telephone Encounter (Signed)
 This has been received and is located at my desk

## 2024-08-29 NOTE — Telephone Encounter (Signed)
 Did patient talk to you about FMLA? If so what is she needing it for? I know she needed her biometric form filled out as well which I do have

## 2024-08-29 NOTE — Telephone Encounter (Signed)
 Call patient and find out.  Do not remember if she talked about FMLA

## 2024-09-03 ENCOUNTER — Ambulatory Visit: Payer: Self-pay | Admitting: Emergency Medicine

## 2024-09-03 ENCOUNTER — Ambulatory Visit: Admitting: Emergency Medicine

## 2024-09-03 ENCOUNTER — Encounter: Payer: Self-pay | Admitting: Emergency Medicine

## 2024-09-03 VITALS — BP 112/80 | HR 89 | Temp 97.9°F | Ht 62.0 in | Wt 146.0 lb

## 2024-09-03 DIAGNOSIS — Z0000 Encounter for general adult medical examination without abnormal findings: Secondary | ICD-10-CM

## 2024-09-03 DIAGNOSIS — G43709 Chronic migraine without aura, not intractable, without status migrainosus: Secondary | ICD-10-CM | POA: Diagnosis not present

## 2024-09-03 DIAGNOSIS — K219 Gastro-esophageal reflux disease without esophagitis: Secondary | ICD-10-CM | POA: Diagnosis not present

## 2024-09-03 DIAGNOSIS — Z13228 Encounter for screening for other metabolic disorders: Secondary | ICD-10-CM | POA: Diagnosis not present

## 2024-09-03 DIAGNOSIS — Z13220 Encounter for screening for lipoid disorders: Secondary | ICD-10-CM | POA: Diagnosis not present

## 2024-09-03 DIAGNOSIS — Z0001 Encounter for general adult medical examination with abnormal findings: Secondary | ICD-10-CM

## 2024-09-03 DIAGNOSIS — Z113 Encounter for screening for infections with a predominantly sexual mode of transmission: Secondary | ICD-10-CM | POA: Diagnosis not present

## 2024-09-03 DIAGNOSIS — F341 Dysthymic disorder: Secondary | ICD-10-CM | POA: Diagnosis not present

## 2024-09-03 DIAGNOSIS — Z1329 Encounter for screening for other suspected endocrine disorder: Secondary | ICD-10-CM

## 2024-09-03 DIAGNOSIS — Z8639 Personal history of other endocrine, nutritional and metabolic disease: Secondary | ICD-10-CM

## 2024-09-03 DIAGNOSIS — Z130 Encounter for screening for diseases of the blood and blood-forming organs and certain disorders involving the immune mechanism: Secondary | ICD-10-CM | POA: Diagnosis not present

## 2024-09-03 LAB — CBC WITH DIFFERENTIAL/PLATELET
Basophils Absolute: 0 K/uL (ref 0.0–0.1)
Basophils Relative: 0.4 % (ref 0.0–3.0)
Eosinophils Absolute: 0.1 K/uL (ref 0.0–0.7)
Eosinophils Relative: 3 % (ref 0.0–5.0)
HCT: 38.9 % (ref 36.0–46.0)
Hemoglobin: 13.3 g/dL (ref 12.0–15.0)
Lymphocytes Relative: 48.3 % — ABNORMAL HIGH (ref 12.0–46.0)
Lymphs Abs: 2.2 K/uL (ref 0.7–4.0)
MCHC: 34.2 g/dL (ref 30.0–36.0)
MCV: 91 fl (ref 78.0–100.0)
Monocytes Absolute: 0.3 K/uL (ref 0.1–1.0)
Monocytes Relative: 6.2 % (ref 3.0–12.0)
Neutro Abs: 1.9 K/uL (ref 1.4–7.7)
Neutrophils Relative %: 42.1 % — ABNORMAL LOW (ref 43.0–77.0)
Platelets: 217 K/uL (ref 150.0–400.0)
RBC: 4.28 Mil/uL (ref 3.87–5.11)
RDW: 12.8 % (ref 11.5–15.5)
WBC: 4.6 K/uL (ref 4.0–10.5)

## 2024-09-03 LAB — URINALYSIS, ROUTINE W REFLEX MICROSCOPIC
Bilirubin Urine: NEGATIVE
Ketones, ur: NEGATIVE
Nitrite: NEGATIVE
Specific Gravity, Urine: 1.01 (ref 1.000–1.030)
Total Protein, Urine: NEGATIVE
Urine Glucose: NEGATIVE
Urobilinogen, UA: 0.2 (ref 0.0–1.0)
pH: 6 (ref 5.0–8.0)

## 2024-09-03 LAB — HIV ANTIBODY (ROUTINE TESTING W REFLEX)
HIV 1&2 Ab, 4th Generation: NONREACTIVE
HIV FINAL INTERPRETATION: NEGATIVE

## 2024-09-03 LAB — LIPID PANEL
Cholesterol: 188 mg/dL (ref 28–200)
HDL: 54.9 mg/dL
LDL Cholesterol: 120 mg/dL — ABNORMAL HIGH (ref 10–99)
NonHDL: 132.86
Total CHOL/HDL Ratio: 3
Triglycerides: 64 mg/dL (ref 10.0–149.0)
VLDL: 12.8 mg/dL (ref 0.0–40.0)

## 2024-09-03 LAB — COMPREHENSIVE METABOLIC PANEL WITH GFR
ALT: 42 U/L — ABNORMAL HIGH (ref 3–35)
AST: 28 U/L (ref 5–37)
Albumin: 4.2 g/dL (ref 3.5–5.2)
Alkaline Phosphatase: 102 U/L (ref 39–117)
BUN: 16 mg/dL (ref 6–23)
CO2: 33 meq/L — ABNORMAL HIGH (ref 19–32)
Calcium: 9.4 mg/dL (ref 8.4–10.5)
Chloride: 101 meq/L (ref 96–112)
Creatinine, Ser: 0.78 mg/dL (ref 0.40–1.20)
GFR: 86.56 mL/min
Glucose, Bld: 86 mg/dL (ref 70–99)
Potassium: 3.9 meq/L (ref 3.5–5.1)
Sodium: 138 meq/L (ref 135–145)
Total Bilirubin: 0.4 mg/dL (ref 0.2–1.2)
Total Protein: 7.5 g/dL (ref 6.0–8.3)

## 2024-09-03 LAB — VITAMIN D 25 HYDROXY (VIT D DEFICIENCY, FRACTURES): VITD: 31.78 ng/mL (ref 30.00–100.00)

## 2024-09-03 LAB — VITAMIN B12: Vitamin B-12: 524 pg/mL (ref 211–911)

## 2024-09-03 LAB — TSH: TSH: 2.72 u[IU]/mL (ref 0.35–5.50)

## 2024-09-03 LAB — HEMOGLOBIN A1C: Hgb A1c MFr Bld: 5.2 % (ref 4.6–6.5)

## 2024-09-03 MED ORDER — ESCITALOPRAM OXALATE 20 MG PO TABS: 20.0000 mg | ORAL_TABLET | Freq: Every day | ORAL | 3 refills | Status: AC

## 2024-09-03 NOTE — Assessment & Plan Note (Signed)
 Clinically stable. Recently triggered by cough medicine Maxalt  helps when needed Follows up with neurologist on a regular basis

## 2024-09-03 NOTE — Assessment & Plan Note (Signed)
 Clinically stable.  Asymptomatic Not taking any medication at present time Diet and nutrition discussed

## 2024-09-03 NOTE — Assessment & Plan Note (Signed)
 Mixed depression and anxiety Known triggers.  Mostly work related stress. Work note limiting working hours provided. Advised to not work more than 8-hour shifts and no more than 40 hours/week. Still active but much improved on Lexapro .  Dose recently increased to 20 mg We will continue medication at the increased dose. Stress management discussed.

## 2024-09-03 NOTE — Patient Instructions (Signed)
 Mantenimiento de Radiographer, therapeutic en las mujeres Health Maintenance, Female Adoptar un estilo de vida saludable y recibir atencin preventiva son importantes para promover la salud y Counsellor. Consulte al mdico sobre: El esquema adecuado para hacerse pruebas y exmenes peridicos. Cosas que puede hacer por su cuenta para prevenir enfermedades y Rodanthe sano. Qu debo saber sobre la dieta, el peso y el ejercicio? Consuma una dieta saludable  Consuma una dieta que incluya muchas verduras, frutas, productos lcteos con bajo contenido de [place] y Associate Professor. No consuma muchos alimentos ricos en grasas slidas, azcares agregados o sodio. Mantenga un peso saludable El ndice de masa muscular [facility]) se [place] para identificar problemas de [place]. Proporciona una estimacin de la grasa corporal basndose en el peso y la altura. Su mdico puede ayudarle a Engineer, site [facility] y a Personnel officer o Pharmacologist un peso saludable. Haga ejercicio con regularidad Haga ejercicio con regularidad. Esta es una de las prcticas ms importantes que puede hacer por su salud. La [employer] de los adultos deben seguir estas pautas: Education officer, environmental, al menos, 150 minutos de actividad fsica por semana. El ejercicio debe aumentar la frecuencia cardaca y Media planner transpirar (ejercicio de intensidad moderada). Hacer ejercicios de fortalecimiento por lo [employer] por semana. Agregue esto a su plan de ejercicio de intensidad moderada. Pase menos tiempo sentada. Incluso la actividad fsica ligera puede ser beneficiosa. Controle sus niveles de colesterol y lpidos en la sangre Comience a realizarse anlisis de lpidos y Oncologist en la sangre a los 20 aos y luego reptalos cada 5 aos. Hgase controlar los niveles de colesterol con mayor frecuencia si: Sus niveles de lpidos y colesterol son altos. Es mayor de 40 aos. Presenta un alto riesgo de padecer enfermedades cardacas. Qu debo saber sobre las pruebas de deteccin del  cncer? Segn su historia clnica y sus antecedentes familiares, es posible que deba realizarse pruebas de deteccin del cncer en diferentes edades. Esto puede incluir pruebas de deteccin de lo siguiente: Cncer de mama. Cncer de cuello uterino. Cncer colorrectal. Cncer de piel. Cncer de pulmn. Qu debo saber sobre la enfermedad cardaca, la diabetes y la hipertensin arterial? Presin arterial y enfermedad cardaca La hipertensin arterial causa enfermedades cardacas y [place] el riesgo de accidente cerebrovascular. Es ms probable que esto se manifieste en las personas que tienen lecturas de presin arterial alta o tienen sobrepeso. Hgase controlar la presin arterial: Cada 3 a 5 aos si tiene entre 18 y 50 aos. Todos los aos si es mayor de 40 aos. Diabetes Realcese exmenes de deteccin de la diabetes con regularidad. Este anlisis revisa el nivel de azcar en la sangre en [place]. Hgase las pruebas de deteccin: Cada tres aos despus de los 40 aos de edad si tiene un peso normal y un bajo riesgo de padecer diabetes. Con ms frecuencia y a partir de [place] edad inferior si tiene sobrepeso o un alto riesgo de padecer diabetes. Qu debo saber sobre la prevencin de infecciones? Hepatitis B Si tiene un riesgo ms alto de contraer hepatitis B, debe someterse a un examen de deteccin de este virus. Hable con el mdico para averiguar si tiene riesgo de contraer la infeccin por hepatitis B. Hepatitis C Se recomienda el anlisis a: [employer] 1945 y 1965. Todas las personas que tengan un riesgo de haber contrado hepatitis C. Enfermedades de transmisin sexual (ETS) Hgase las pruebas de Airline pilot de ITS, incluidas la gonorrea y la clamidia, si: Es sexualmente activa y es Adult nurse de 24  aos. Es mayor de 24 aos, y el mdico le informa que corre riesgo de tener este tipo de infecciones. La actividad sexual ha cambiado desde que le hicieron la ltima prueba de  deteccin y tiene un riesgo mayor de Warehouse manager clamidia o Copy. Pregntele al mdico si usted tiene riesgo. Pregntele al mdico si usted tiene un alto riesgo de Primary school teacher VIH. El mdico tambin puede recomendarle un medicamento recetado para ayudar a evitar la infeccin por el VIH. Si elige tomar medicamentos para prevenir el VIH, primero debe [employer] de deteccin del VIH. Luego debe hacerse anlisis cada 3 meses mientras est tomando los medicamentos. Embarazo Si est por dejar de Armed forces training and education officer (fase premenopusica) y usted puede quedar [place], busque asesoramiento antes de [place]. Tome de 400 a 800 microgramos (mcg) de cido [employer] si [place]. Pida mtodos de control de la natalidad (anticonceptivos) si desea evitar un embarazo no deseado. Osteoporosis y [place] La osteoporosis es una enfermedad en la que los huesos pierden los minerales y la fuerza por el avance de la edad. El resultado pueden ser fracturas en los [place]. Si tiene 65 aos o ms, o si est en riesgo de sufrir osteoporosis y fracturas, pregunte a su mdico si debe: Hacerse pruebas de deteccin de prdida sea. Tomar un suplemento de calcio o de vitamina D para reducir el riesgo de fracturas. Recibir terapia de reemplazo hormonal (TRH) para tratar los sntomas de la menopausia. Siga estas indicaciones en su casa: Consumo de alcohol No beba alcohol si: Su mdico le indica no hacerlo. Est embarazada, puede estar embarazada o est tratando de [place]. Si bebe alcohol: Limite la cantidad que bebe a lo siguiente: De 0 a 1 bebida por da. Sepa cunta cantidad de alcohol hay en las bebidas que toma. En los [address], una medida equivale a una botella de cerveza de 12 oz (355 ml), un vaso de vino de 5 oz (148 ml) o un vaso de una bebida alcohlica de alta graduacin de 1 oz (44 ml). Estilo de vida No consuma ningn producto que contenga nicotina o tabaco. Estos  productos incluyen cigarrillos, tabaco para Theatre manager y aparatos de vapeo, como los Administrator, Civil Service. Si necesita ayuda para dejar de consumir estos productos, consulte al mdico. No consuma drogas. No comparta agujas. Solicite ayuda a su mdico si necesita apoyo o informacin para abandonar las drogas. Indicaciones generales Realcese los estudios de rutina de [address], dentales y de Wellsite geologist. Mantngase al da con las vacunas. Infrmele a su mdico si: Se siente deprimida con frecuencia. Alguna vez ha sido vctima de [place] o no se siente seguro en su casa. Resumen Adoptar un estilo de vida saludable y recibir atencin preventiva son importantes para promover la salud y Counsellor. Siga las instrucciones del mdico acerca de una dieta saludable, el ejercicio y la realizacin de pruebas o exmenes para Hotel manager. Siga las instrucciones del mdico con respecto al control del colesterol y la presin arterial. Esta informacin no tiene Theme park manager el consejo del mdico. Asegrese de hacerle al mdico cualquier pregunta que tenga. Document Revised: 01/07/2021 Document Reviewed: 01/07/2021 Elsevier Patient Education  2024 [employer].

## 2024-09-03 NOTE — Assessment & Plan Note (Signed)
 Clinically stable.  Asymptomatic TSH done today

## 2024-09-03 NOTE — Progress Notes (Signed)
 Desiree Krueger 54 y.o.   Chief Complaint  Patient presents with   Annual Exam    HISTORY OF PRESENT ILLNESS: This is a 54 y.o. female here for annual exam and follow-up of chronic medical conditions Has situational anxiety and depression.  Recently had Lexapro  increased to 20 mg daily.  Requesting new prescription. History of chronic migraine headaches History of hyperthyroidism No other complaints or medical concerns today.  HPI   Prior to Admission medications  Medication Sig Start Date End Date Taking? Authorizing Provider  benzonatate  (TESSALON ) 200 MG capsule Take 1 capsule (200 mg total) by mouth 2 (two) times daily as needed for cough. 08/28/24  Yes Jaegar Croft, Emil Schanz, MD  celecoxib  (CELEBREX ) 200 MG capsule Take 1 capsule (200 mg total) by mouth 2 (two) times daily. 06/24/24  Yes Leonce Katz, DO  escitalopram  (LEXAPRO ) 20 MG tablet Take 1 tablet (20 mg total) by mouth daily. 09/03/24  Yes Khole Arterburn, Emil Schanz, MD  HYDROcodone  bit-homatropine [facility]) 5-1.5 MG/5ML syrup Take 5 mLs by mouth at bedtime as needed for cough. 08/28/24  Yes Alyria Krack, Emil Schanz, MD  MAGNESIUM OXIDE, ANTACID, PO Take 1 tablet by mouth in the morning, at noon, in the evening, and at bedtime.   Yes [provider]  ondansetron  (ZOFRAN ) 4 MG tablet Take 4 mg by mouth every 8 (eight) hours as needed for nausea or vomiting.   Yes [provider]  Pediatric Multivitamins-Fl (MULTIVITAMIN + FLUORIDE) 0.25 MG CHEW Chew 1 tablet by mouth daily.   Yes [provider]  rizatriptan  (MAXALT -MLT) 10 MG disintegrating tablet Take 1 tablet (10 mg total) by mouth as needed for migraine. May repeat in 2 hours if needed 07/01/24  Yes Whitfield Raisin, NP    Allergies[1]  Patient Active Problem List   Diagnosis Date Noted   Dysthymia 07/05/2022   Chronic idiopathic constipation 06/23/2022   Intractable chronic migraine without aura and without status migrainosus 08/18/2021    Postmenopausal 08/18/2021   Chronic bilateral low back pain without sciatica 02/10/2021   Chronic migraine w/o aura w/o status migrainosus, not intractable 12/24/2020   Iron deficiency 08/24/2017   Gastroesophageal reflux disease without esophagitis 01/11/2017   History of hyperthyroidism 01/11/2017   Hyperthyroidism 01/11/2017   Anemia due to chronic blood loss 03/16/2016   Submucous leiomyoma of uterus 03/16/2016    Past Medical History:  Diagnosis Date   Anemia    Constipation    Depression    Dysrhythmia    GERD (gastroesophageal reflux disease)    Headache    MIGRAINES   Low blood pressure    Lower back pain    Thyroid  disease    Varicose vein of leg    BILATERAL    Past Surgical History:  Procedure Laterality Date   DILATATION & CURETTAGE/HYSTEROSCOPY WITH MYOSURE N/A 01/31/2017   Procedure: DILATATION & CURETTAGE/HYSTEROSCOPY WITH MYOSURE;  Surgeon: Winfred Curlee DEL, MD;  Location: [facility];  Service: Gynecology;  Laterality: N/A;   DILATATION & CURETTAGE/HYSTEROSCOPY WITH MYOSURE N/A 09/29/2017   Procedure: DILATATION & CURETTAGE/HYSTEROSCOPY WITH MYOSURE RESECTION OF SUBMUCOSAL MYOMA;  Surgeon: Lavoie, Marie-Lyne, MD;  Location: [facility];  Service: Gynecology;  Laterality: N/A;  request to follow 2nd case around 10am in [place] [facility] block time Requests one hour OR time   HYSTEROSCOPY WITH NOVASURE N/A 09/29/2017   Procedure: HYSTEROSCOPY WITH NOVASURE ENDOMETRIAL ABLATION;  Surgeon: Lavoie, Marie-Lyne, MD;  Location: [facility];  Service: Gynecology;  Laterality: N/A;   TUBAL  LIGATION      Social History   Socioeconomic History   Marital status: Single    Spouse name: Not on file   Number of children: 2   Years of education: two years college   Highest education level: Bachelor's degree (e.g., BA, AB, BS)  Occupational History   Occupation: warehouse  Tobacco Use   Smoking status: Former    Types: Cigarettes   Smokeless  tobacco: Never  Vaping Use   Vaping status: Never Used  Substance and Sexual Activity   Alcohol use: Yes    Comment: occ   Drug use: Never   Sexual activity: Yes    Partners: Male    Birth control/protection: Post-menopausal, Surgical    Comment: BTL  Other Topics Concern   Not on file  Social History Narrative   Lives at home with her daughter.   Right-handed.   Caffeine use: 3 cups per day.   Social Drivers of Health   Tobacco Use: Medium Risk (09/03/2024)   Patient History    Smoking Tobacco Use: Former    Smokeless Tobacco Use: Never    Passive Exposure: Not on file  Financial Resource Strain: Medium Risk (08/29/2023)   Overall Financial Resource Strain (CARDIA)    Difficulty of Paying Living Expenses: Somewhat hard  Food Insecurity: Food Insecurity Present (08/29/2023)   Hunger Vital Sign    Worried About Running Out of Food in the Last Year: Sometimes true    Ran Out of Food in the Last Year: Patient declined  Transportation Needs: No Transportation Needs (08/29/2023)   PRAPARE - Administrator, Civil Service (Medical): No    Lack of Transportation (Non-Medical): No  Physical Activity: Insufficiently Active (08/29/2023)   Exercise Vital Sign    Days of Exercise per Week: 1 day    Minutes of Exercise per Session: 20 min  Stress: No Stress Concern Present (08/29/2023)   [employer] of Occupational Health - Occupational Stress Questionnaire    Feeling of Stress : Only a little  Social Connections: Socially Isolated (08/29/2023)   Social Connection and Isolation Panel    Frequency of Communication with Friends and Family: Three times a week    Frequency of Social Gatherings with Friends and Family: Never    Attends Religious Services: Never    Database Administrator or Organizations: No    Attends Engineer, Structural: Not on file    Marital Status: Never married  Intimate Partner Violence: Not on file  Depression (PHQ2-9): Low Risk (09/03/2024)    Depression (PHQ2-9)    PHQ-2 Score: 0  Alcohol Screen: Low Risk (08/29/2023)   Alcohol Screen    Last Alcohol Screening Score (AUDIT): 1  Housing: Low Risk (08/29/2023)   Housing Stability Vital Sign    Unable to Pay for Housing in the Last Year: No    Number of Times Moved in the Last Year: 0    Homeless in the Last Year: No  Utilities: Not on file  Health Literacy: Not on file    Family History  Problem Relation Age of Onset   Varicose Veins Mother    Dementia Mother    Parkinson's disease Father    Cancer Maternal Uncle        COLON   Migraines Cousin      Review of Systems  Constitutional: Negative.  Negative for chills and fever.  HENT: Negative.  Negative for congestion and sore throat.   Respiratory: Negative.  Negative  for cough and shortness of breath.   Cardiovascular: Negative.  Negative for chest pain and palpitations.  Gastrointestinal:  Negative for abdominal pain, diarrhea, nausea and vomiting.  Genitourinary: Negative.  Negative for dysuria and hematuria.  Skin: Negative.  Negative for rash.  Neurological: Negative.  Negative for dizziness and headaches.  All other systems reviewed and are negative.   Vitals:   09/03/24 0944  BP: 112/80  Pulse: 89  Temp: 97.9 F (36.6 C)  SpO2: 97%    Physical Exam Vitals reviewed.  Constitutional:      Appearance: Normal appearance.  HENT:     Head: Normocephalic.     Right Ear: Tympanic membrane, ear canal and external ear normal.     Left Ear: Tympanic membrane, ear canal and external ear normal.     Mouth/Throat:     Mouth: Mucous membranes are moist.     Pharynx: Oropharynx is clear.  Eyes:     Extraocular Movements: Extraocular movements intact.     Pupils: Pupils are equal, round, and reactive to light.  Cardiovascular:     Rate and Rhythm: Normal rate and regular rhythm.     Pulses: Normal pulses.     Heart sounds: Normal heart sounds.  Pulmonary:     Effort: Pulmonary effort is normal.      Breath sounds: Normal breath sounds.  Musculoskeletal:     Cervical back: No tenderness.  Lymphadenopathy:     Cervical: No cervical adenopathy.  Skin:    General: Skin is warm and dry.     Capillary Refill: Capillary refill takes less than 2 seconds.  Neurological:     General: No focal deficit present.     Mental Status: She is alert and oriented to person, place, and time.  Psychiatric:        Mood and Affect: Mood normal.        Behavior: Behavior normal.      ASSESSMENT & PLAN: Problem List Items Addressed This Visit       Cardiovascular and Mediastinum   Chronic migraine w/o aura w/o status migrainosus, not intractable   Clinically stable. Recently triggered by cough medicine Maxalt  helps when needed Follows up with neurologist on a regular basis      Relevant Medications   escitalopram  (LEXAPRO ) 20 MG tablet     Digestive   Gastroesophageal reflux disease without esophagitis   Clinically stable.  Asymptomatic Not taking any medication at present time Diet and nutrition discussed        Other   History of hyperthyroidism   Clinically stable.  Asymptomatic TSH done today      Dysthymia   Mixed depression and anxiety Known triggers.  Mostly work related stress. Work note limiting working hours provided. Advised to not work more than 8-hour shifts and no more than 40 hours/week. Still active but much improved on Lexapro .  Dose recently increased to 20 mg We will continue medication at the increased dose. Stress management discussed.      Relevant Medications   escitalopram  (LEXAPRO ) 20 MG tablet   Other Visit Diagnoses       Encounter for general adult medical examination with abnormal findings    -  Primary   Relevant Orders   CBC with Differential/Platelet   Comprehensive metabolic panel with GFR   Hemoglobin A1c   Lipid panel   TSH   Vitamin B12   VITAMIN D  25 Hydroxy (Vit-D Deficiency, Fractures)     Screening for STD (sexually  transmitted  disease)       Relevant Orders   Urinalysis   GC/Chlamydia Probe Amp   HIV antibody (with reflex)     Screening for deficiency anemia       Relevant Orders   CBC with Differential/Platelet     Screening for lipoid disorders       Relevant Orders   Lipid panel     Screening for endocrine, metabolic and immunity disorder       Relevant Orders   Comprehensive metabolic panel with GFR   Hemoglobin A1c   TSH   Vitamin B12   VITAMIN D  25 Hydroxy (Vit-D Deficiency, Fractures)      Modifiable risk factors discussed with patient. Anticipatory guidance according to age provided. The following topics were also discussed: Social Determinants of Health Smoking.  Non-smoker Diet and nutrition Benefits of exercise Cancer screening and review of most recent mammogram and colonoscopy reports Vaccinations review and recommendations Cardiovascular risk assessment and need for blood work The 10-year ASCVD risk score (Arnett DK, et al., 2019) is: 1%   Values used to calculate the score:     Age: 43 years     Clinically relevant sex: Female     Is Non-Hispanic African American: No     Diabetic: No     Tobacco smoker: No     Systolic Blood Pressure: 112 mmHg     Is BP treated: No     HDL Cholesterol: 65 mg/dL     Total Cholesterol: 190 mg/dL Review of chronic medical conditions under management Mental health including depression and anxiety Fall and accident prevention  Patient Instructions  Mantenimiento de radiographer, therapeutic en las mujeres Health Maintenance, Female Adoptar un estilo de vida saludable y recibir atencin preventiva son importantes para promover la salud y counsellor. Consulte al mdico sobre: El esquema adecuado para hacerse pruebas y exmenes peridicos. Cosas que puede hacer por su cuenta para prevenir enfermedades y [place] sano. Qu debo saber sobre la dieta, el peso y el ejercicio? Consuma una dieta saludable  Consuma una dieta que incluya muchas verduras, frutas,  productos lcteos con bajo contenido de grasa y protenas magras. No consuma muchos alimentos ricos en grasas slidas, azcares agregados o sodio. Mantenga un peso saludable El ndice de masa muscular [facility] [place]) se utiliza para identificar problemas de [place]. Proporciona una estimacin de la grasa corporal basndose en el peso y la altura. Su mdico puede ayudarle a determinar su IMC y a personnel officer o pharmacologist un peso saludable. Haga ejercicio con regularidad Haga ejercicio con regularidad. Esta es una de las prcticas ms importantes que puede hacer por su salud. La [employer] de los adultos deben seguir estas pautas: Education Officer, Environmental, al menos, 150 minutos de actividad fsica por semana. El ejercicio debe aumentar la frecuencia cardaca y media planner transpirar (ejercicio de intensidad moderada). Hacer ejercicios de fortalecimiento por lo [facility] aid por semana. Agregue esto a su plan de ejercicio de intensidad moderada. Pase menos tiempo sentada. Incluso la actividad fsica ligera puede ser beneficiosa. Controle sus niveles de colesterol y lpidos en la sangre Comience a realizarse anlisis de lpidos y oncologist en la sangre a los 20 aos y luego reptalos cada 5 aos. Hgase controlar los niveles de colesterol con mayor frecuencia si: Sus niveles de lpidos y colesterol son altos. Es mayor de 40 aos. Presenta un alto riesgo de padecer enfermedades cardacas. Qu debo saber sobre las pruebas de deteccin del cncer? Segn su historia clnica y sus antecedentes familiares, es  posible que deba realizarse pruebas de deteccin del cncer en diferentes edades. Esto puede incluir pruebas de deteccin de lo siguiente: Cncer de mama. Cncer de cuello uterino. Cncer colorrectal. Cncer de piel. Cncer de pulmn. Qu debo saber sobre la enfermedad cardaca, la diabetes y la hipertensin arterial? Presin arterial y enfermedad cardaca La hipertensin arterial causa enfermedades cardacas y aumenta el riesgo de  accidente cerebrovascular. Es ms probable que esto se manifieste en las personas que tienen lecturas de presin arterial alta o tienen sobrepeso. Hgase controlar la presin arterial: Cada 3 a 5 aos si tiene entre 18 y 72 aos. Todos los aos si es mayor de 40 aos. Diabetes Realcese exmenes de deteccin de la diabetes con regularidad. Este anlisis revisa el nivel de azcar en la sangre en [place]. Hgase las pruebas de deteccin: Cada tres aos despus de los 40 aos de edad si tiene un peso normal y un bajo riesgo de padecer diabetes. Con ms frecuencia y a partir de [place] edad inferior si tiene sobrepeso o un alto riesgo de padecer diabetes. Qu debo saber sobre la prevencin de infecciones? Hepatitis B Si tiene un riesgo ms alto de contraer hepatitis B, debe someterse a un examen de deteccin de este virus. Hable con el mdico para averiguar si tiene riesgo de contraer la infeccin por hepatitis B. Hepatitis C Se recomienda el anlisis a: [employer] 1945 y 1965. Todas las personas que tengan un riesgo de haber contrado hepatitis C. Enfermedades de transmisin sexual (ETS) Hgase las pruebas de airline pilot de ITS, incluidas la gonorrea y la clamidia, si: Es sexualmente activa y es menor de [address]. Es mayor de [address], y public affairs consultant informa que corre riesgo de tener este tipo de infecciones. La actividad sexual ha cambiado desde que le hicieron la ltima prueba de deteccin y tiene un riesgo mayor de tener clamidia o copy. Pregntele al mdico si usted tiene riesgo. Pregntele al mdico si usted tiene un alto riesgo de primary school teacher VIH. El mdico tambin puede recomendarle un medicamento recetado para ayudar a evitar la infeccin por el VIH. Si elige tomar medicamentos para prevenir el VIH, primero debe oneok de deteccin del VIH. Luego debe hacerse anlisis cada 3 meses mientras est tomando los medicamentos. Embarazo Si est por dejar de menstruar (fase  premenopusica) y usted puede quedar embarazada, busque asesoramiento antes de quedar embarazada. Tome de 400 a 800 microgramos (mcg) de cido flico todos los das si queda embarazada. Pida mtodos de control de la natalidad (anticonceptivos) si desea evitar un embarazo no deseado. Osteoporosis y menopausia La osteoporosis es una enfermedad en la que los huesos pierden los minerales y la fuerza por el avance de la edad. El resultado pueden ser fracturas en los [place]. Si tiene 65 aos o ms, o si est en riesgo de sufrir osteoporosis y fracturas, pregunte a su mdico si debe: Hacerse pruebas de deteccin de prdida sea. Tomar un suplemento de calcio o de vitamina D para reducir el riesgo de fracturas. Recibir terapia de reemplazo hormonal (TRH) para tratar los sntomas de la menopausia. Siga estas indicaciones en su casa: Consumo de alcohol No beba alcohol si: Su mdico le indica no hacerlo. Est embarazada, puede estar embarazada o est tratando de quedar embarazada. Si bebe alcohol: Limite la cantidad que bebe a lo siguiente: De 0 a 1 bebida por da. Sepa cunta cantidad de alcohol hay en las bebidas que toma. En los [address], [address] medida [place]  a una botella de cerveza de 12 oz (355 ml), un vaso de vino de 5 oz (148 ml) o un vaso de una bebida alcohlica de alta graduacin de 1 oz (44 ml). Estilo de vida No consuma ningn producto que contenga nicotina o tabaco. Estos productos incluyen cigarrillos, tabaco para theatre manager y aparatos de vapeo, como los cigarrillos electrnicos. Si necesita ayuda para dejar de consumir estos productos, consulte al mdico. No consuma drogas. No comparta agujas. Solicite ayuda a su mdico si necesita apoyo o informacin para abandonar las drogas. Indicaciones generales Realcese los estudios de rutina de [address], dentales y de wellsite geologist. Mantngase al da con las vacunas. Infrmele a su mdico si: Se siente deprimida con frecuencia. Alguna vez ha sido  vctima de maltrato o no se siente seguro en su casa. Resumen Adoptar un estilo de vida saludable y recibir atencin preventiva son importantes para promover la salud y counsellor. Siga las instrucciones del mdico acerca de una dieta saludable, el ejercicio y la realizacin de pruebas o exmenes para hotel manager. Siga las instrucciones del mdico con respecto al control del colesterol y la presin arterial. Esta informacin no tiene theme park manager el consejo del mdico. Asegrese de hacerle al mdico cualquier pregunta que tenga. Document Revised: 01/07/2021 Document Reviewed: 01/07/2021 Elsevier Patient Education  2024 Elsevier Inc.      Emil Schaumann, MD [facility]    [1] No Known Allergies

## 2024-09-06 ENCOUNTER — Telehealth: Payer: Self-pay | Admitting: Pharmacy Technician

## 2024-09-06 ENCOUNTER — Other Ambulatory Visit (HOSPITAL_COMMUNITY): Payer: Self-pay

## 2024-09-06 NOTE — Telephone Encounter (Signed)
 Pharmacy Patient Advocate Encounter   Received notification from [facility] that prior authorization for HYDROcodone  Bit-Homatrop MBr 5-1.5MG /5ML solution is required/requested.   Insurance verification completed.   The patient is insured through [employer].   Per test claim: PA required; PA submitted to above mentioned insurance via Latent Key/confirmation #/EOC [id number] Status is pending

## 2024-09-07 LAB — GC/CHLAMYDIA PROBE AMP
Chlamydia trachomatis, NAA: NEGATIVE
Neisseria Gonorrhoeae by PCR: NEGATIVE

## 2024-09-11 NOTE — Telephone Encounter (Signed)
 Pharmacy Patient Advocate Encounter  Received notification from [employer] that Prior Authorization for HYDROcodone  Bit-Homatrop MBr 5-1.5MG /5ML solution has been DENIED.  Full denial letter will be uploaded to the media tab. See denial reason below.   PA #/Case ID/Reference #: [id number]

## 2024-09-11 NOTE — Telephone Encounter (Signed)
 Labs pretty good. No concerns. Has questions about FMLA related to 08/28/24 visit.

## 2024-09-11 NOTE — Telephone Encounter (Signed)
 Please advise.

## 2024-09-20 ENCOUNTER — Telehealth: Payer: Self-pay

## 2024-09-20 NOTE — Telephone Encounter (Signed)
 Patient has picked up her biometric form and FMLA forms today

## 2025-07-01 ENCOUNTER — Ambulatory Visit: Admitting: Adult Health
